# Patient Record
Sex: Male | Born: 1950
Health system: Southern US, Community
[De-identification: ages and names within clinical notes are randomized; demographics above are authoritative.]

## PROBLEM LIST (undated history)

## (undated) DIAGNOSIS — Z720 Tobacco use: Secondary | ICD-10-CM

## (undated) DIAGNOSIS — R569 Unspecified convulsions: Secondary | ICD-10-CM

## (undated) DIAGNOSIS — M1990 Unspecified osteoarthritis, unspecified site: Secondary | ICD-10-CM

## (undated) DIAGNOSIS — K5792 Diverticulitis of intestine, part unspecified, without perforation or abscess without bleeding: Secondary | ICD-10-CM

## (undated) DIAGNOSIS — I341 Nonrheumatic mitral (valve) prolapse: Secondary | ICD-10-CM

## (undated) DIAGNOSIS — H9319 Tinnitus, unspecified ear: Secondary | ICD-10-CM

## (undated) DIAGNOSIS — IMO0001 Reserved for inherently not codable concepts without codable children: Secondary | ICD-10-CM

## (undated) DIAGNOSIS — F419 Anxiety disorder, unspecified: Secondary | ICD-10-CM

## (undated) DIAGNOSIS — G4730 Sleep apnea, unspecified: Secondary | ICD-10-CM

## (undated) DIAGNOSIS — K602 Anal fissure, unspecified: Secondary | ICD-10-CM

## (undated) DIAGNOSIS — G4733 Obstructive sleep apnea (adult) (pediatric): Secondary | ICD-10-CM

## (undated) DIAGNOSIS — Z87442 Personal history of urinary calculi: Secondary | ICD-10-CM

## (undated) DIAGNOSIS — K219 Gastro-esophageal reflux disease without esophagitis: Secondary | ICD-10-CM

## (undated) DIAGNOSIS — E785 Hyperlipidemia, unspecified: Secondary | ICD-10-CM

## (undated) DIAGNOSIS — Z9989 Dependence on other enabling machines and devices: Secondary | ICD-10-CM

## (undated) DIAGNOSIS — F17200 Nicotine dependence, unspecified, uncomplicated: Secondary | ICD-10-CM

## (undated) DIAGNOSIS — R011 Cardiac murmur, unspecified: Secondary | ICD-10-CM

## (undated) DIAGNOSIS — K449 Diaphragmatic hernia without obstruction or gangrene: Secondary | ICD-10-CM

## (undated) HISTORY — PX: INGUINAL HERNIA REPAIR: SUR1180

## (undated) HISTORY — DX: Hyperlipidemia, unspecified: E78.5

## (undated) HISTORY — DX: Anal fissure, unspecified: K60.2

## (undated) HISTORY — DX: Unspecified convulsions: R56.9

## (undated) HISTORY — DX: Reserved for inherently not codable concepts without codable children: IMO0001

## (undated) HISTORY — DX: Tobacco use: Z72.0

## (undated) HISTORY — DX: Anxiety disorder, unspecified: F41.9

## (undated) HISTORY — PX: HERNIA REPAIR: SHX51

## (undated) HISTORY — DX: Sleep apnea, unspecified: G47.30

## (undated) HISTORY — DX: Cardiac murmur, unspecified: R01.1

## (undated) HISTORY — DX: Diverticulitis of intestine, part unspecified, without perforation or abscess without bleeding: K57.92

## (undated) HISTORY — DX: Dependence on other enabling machines and devices: Z99.89

## (undated) HISTORY — DX: Nonrheumatic mitral (valve) prolapse: I34.1

## (undated) HISTORY — DX: Nicotine dependence, unspecified, uncomplicated: F17.200

## (undated) HISTORY — DX: Gastro-esophageal reflux disease without esophagitis: K21.9

## (undated) HISTORY — DX: Tinnitus, unspecified ear: H93.19

## (undated) HISTORY — DX: Obstructive sleep apnea (adult) (pediatric): G47.33

## (undated) HISTORY — PX: COLONOSCOPY: SHX174

## (undated) HISTORY — DX: Gastro-esophageal reflux disease without esophagitis: K44.9

---

## 1997-10-13 ENCOUNTER — Ambulatory Visit (HOSPITAL_BASED_OUTPATIENT_CLINIC_OR_DEPARTMENT_OTHER): Admission: RE | Admit: 1997-10-13 | Discharge: 1997-10-13 | Payer: Self-pay | Admitting: Surgery

## 2002-04-07 ENCOUNTER — Encounter: Payer: Self-pay | Admitting: Surgery

## 2002-04-17 ENCOUNTER — Ambulatory Visit (HOSPITAL_COMMUNITY): Admission: RE | Admit: 2002-04-17 | Discharge: 2002-04-17 | Payer: Self-pay | Admitting: Surgery

## 2002-06-09 ENCOUNTER — Encounter (INDEPENDENT_AMBULATORY_CARE_PROVIDER_SITE_OTHER): Payer: Self-pay | Admitting: Specialist

## 2002-06-09 ENCOUNTER — Ambulatory Visit (HOSPITAL_COMMUNITY): Admission: RE | Admit: 2002-06-09 | Discharge: 2002-06-09 | Payer: Self-pay | Admitting: Gastroenterology

## 2004-02-13 HISTORY — PX: APPENDECTOMY: SHX54

## 2004-12-23 ENCOUNTER — Encounter (INDEPENDENT_AMBULATORY_CARE_PROVIDER_SITE_OTHER): Payer: Self-pay | Admitting: *Deleted

## 2004-12-23 ENCOUNTER — Observation Stay (HOSPITAL_COMMUNITY): Admission: EM | Admit: 2004-12-23 | Discharge: 2004-12-24 | Payer: Self-pay | Admitting: Emergency Medicine

## 2005-02-12 HISTORY — PX: ROTATOR CUFF REPAIR: SHX139

## 2005-07-14 ENCOUNTER — Encounter: Admission: RE | Admit: 2005-07-14 | Discharge: 2005-07-14 | Payer: Self-pay | Admitting: Orthopedic Surgery

## 2005-08-08 ENCOUNTER — Ambulatory Visit (HOSPITAL_BASED_OUTPATIENT_CLINIC_OR_DEPARTMENT_OTHER): Admission: RE | Admit: 2005-08-08 | Discharge: 2005-08-08 | Payer: Self-pay | Admitting: Orthopedic Surgery

## 2006-09-30 IMAGING — CT CT PELVIS W/ CM
3 of 4 series · 14 of 32 positions shown, 19 images · IV contrast (APPLIED)
Comparison: None.

CLINICAL DATA: Right lower quadrant pain.
ABDOMEN CT WITH CONTRAST:
TECHNIQUE: Multidetector CT imaging of the abdomen was performed following the standard protocol during bolus administration of intravenous contrast.
Contrast:  cc Omnipaque 300.
TECHNIQUE: Multidetector CT imaging of the pelvis was performed following the standard protocol during bolus administration of intravenous contrast.

[Series 4: delays 5.0 b31f st · axial · 0.74mm/px · z∈[-324,-234]mm · 4 of 30 slices shown, 9 images]
[im 6/30  soft-tissue]
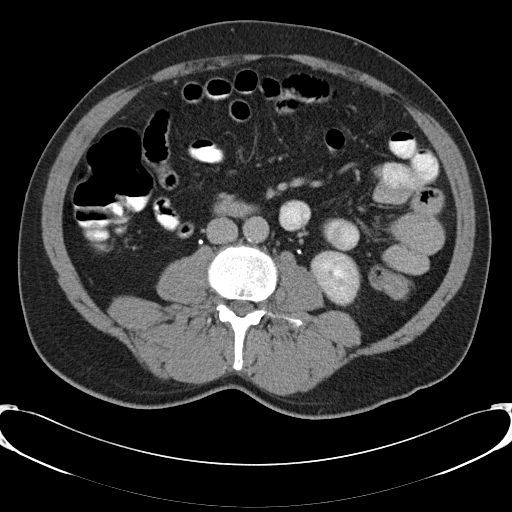
[im 6/30  lung]
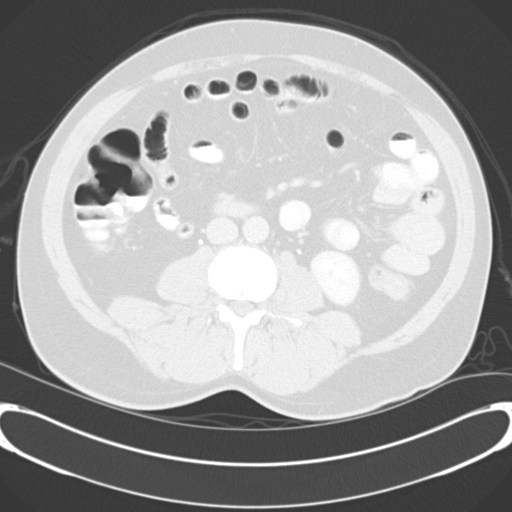
[im 6/30  bone]
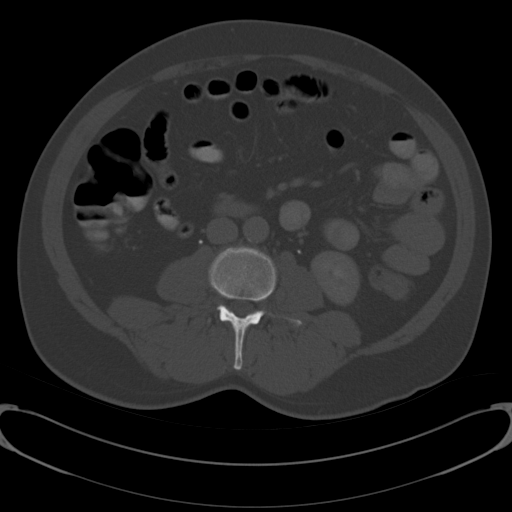
[im 12/30  soft-tissue]
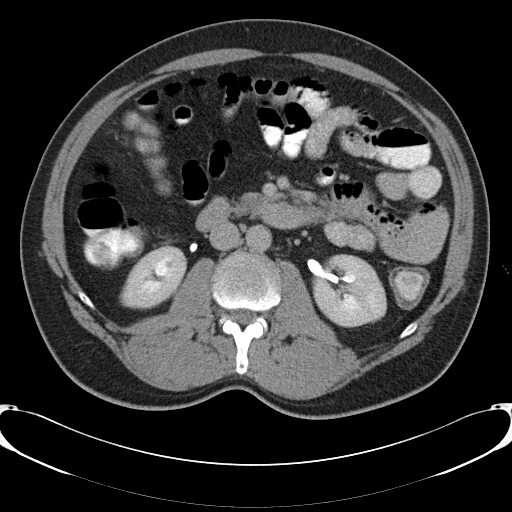
[im 12/30  lung]
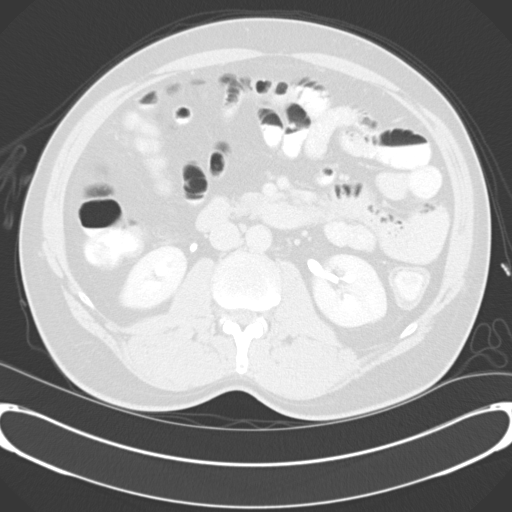
[im 18/30  soft-tissue]
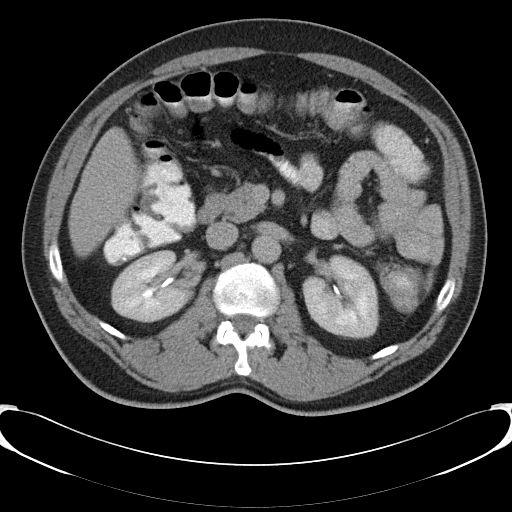
[im 18/30  lung]
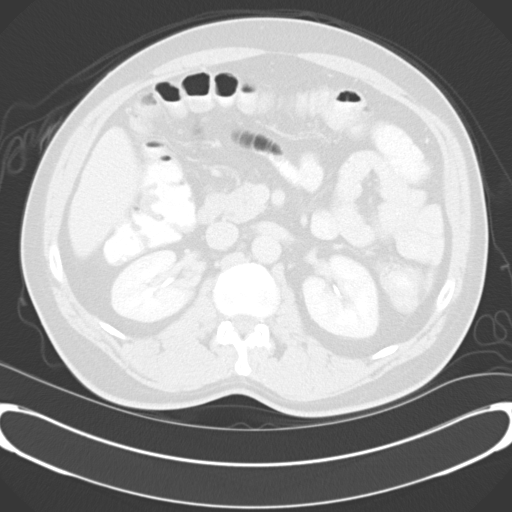
[im 24/30  soft-tissue]
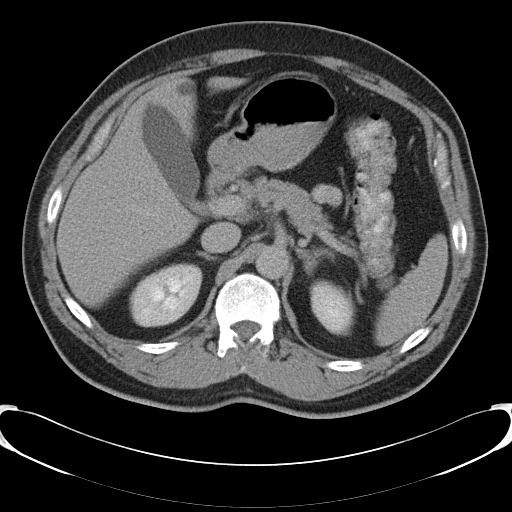
[im 24/30  lung]
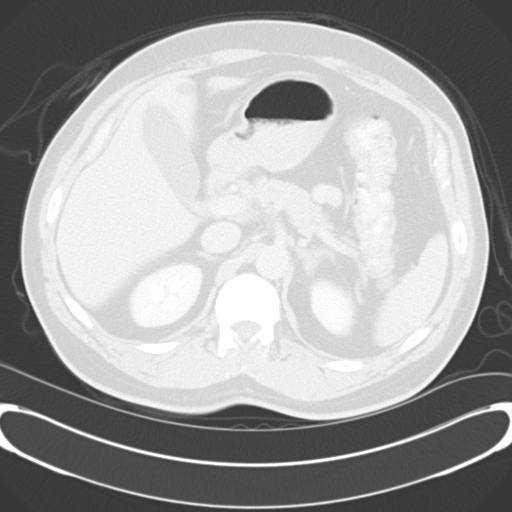

[Series 5: abd/pelv with 5.0 b30f lung · axial · 0.74mm/px · z∈[-508,-154]mm · 8 of 93 slices shown]
[im 11/93  bone]
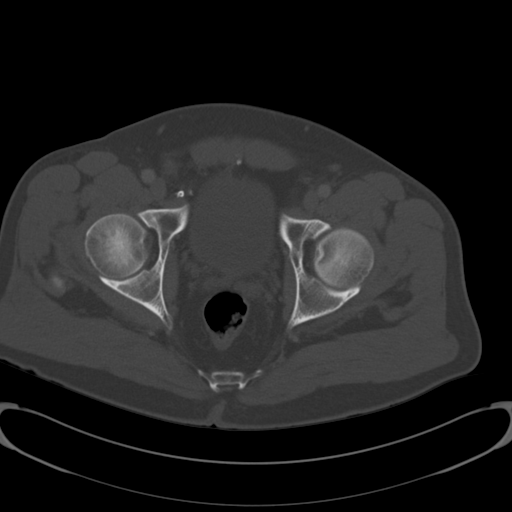
[im 21/93  bone]
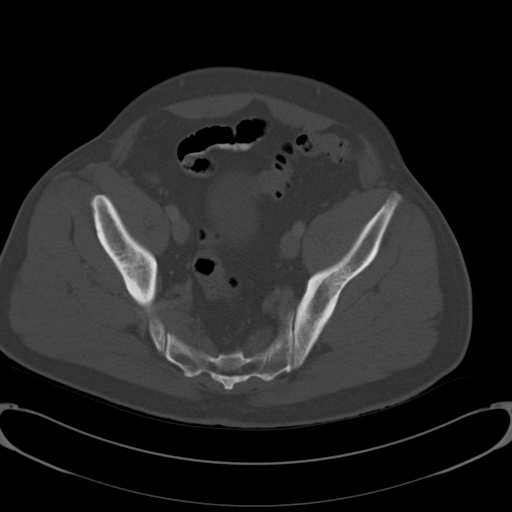
[im 31/93  bone]
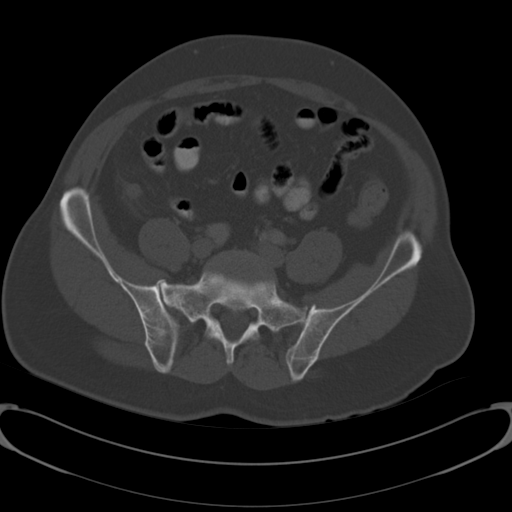
[im 41/93  bone]
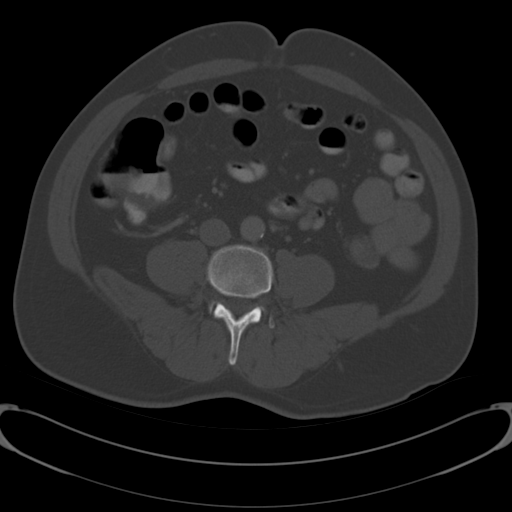
[im 52/93  bone]
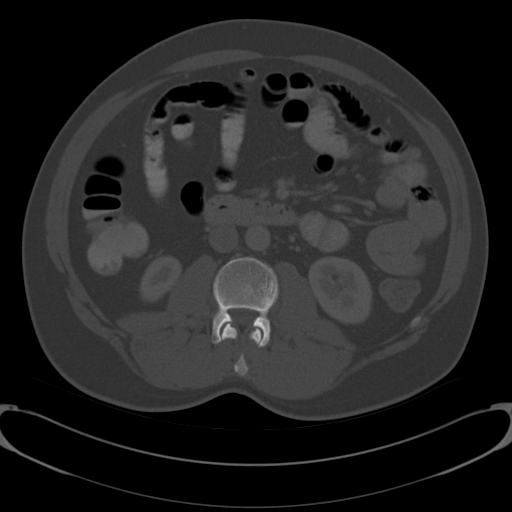
[im 62/93  bone]
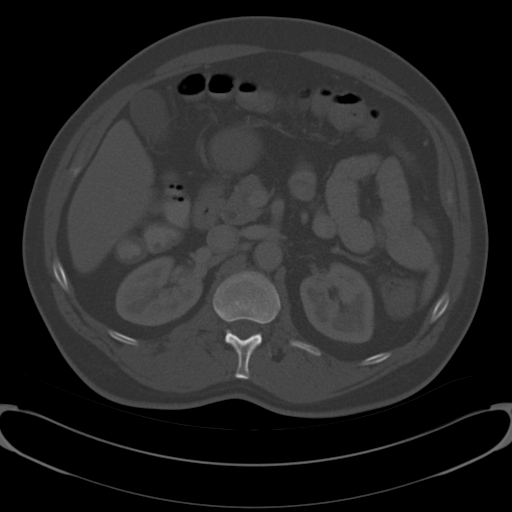
[im 72/93  bone]
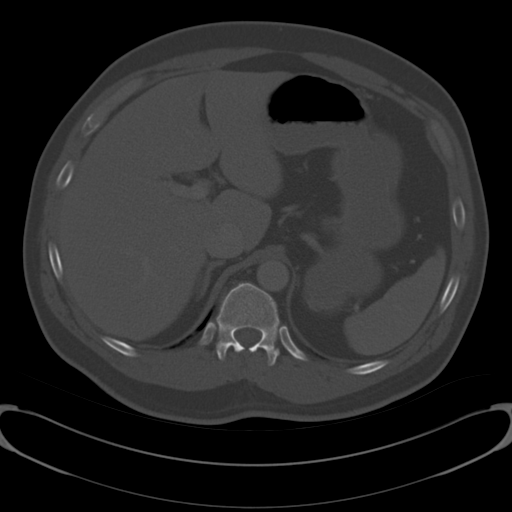
[im 82/93  bone]
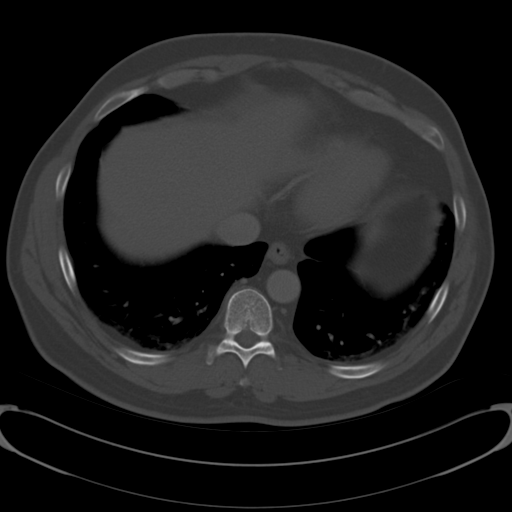

[Series 6: abd/pelv with 5.0 b60f lung · axial · 0.74mm/px · z∈[-194,-164]mm · 2 of 26 slices shown]
[im 7/26  bone]
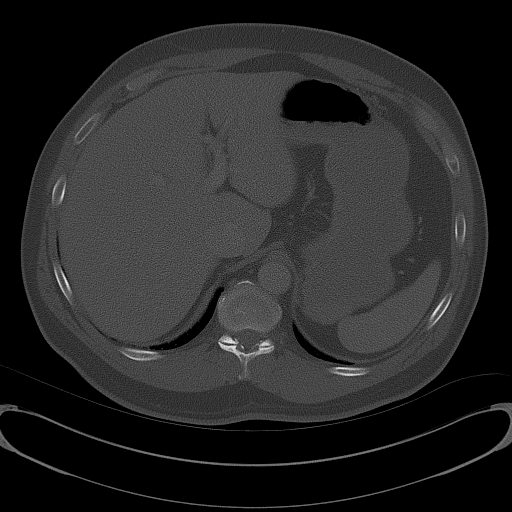
[im 13/26  bone]
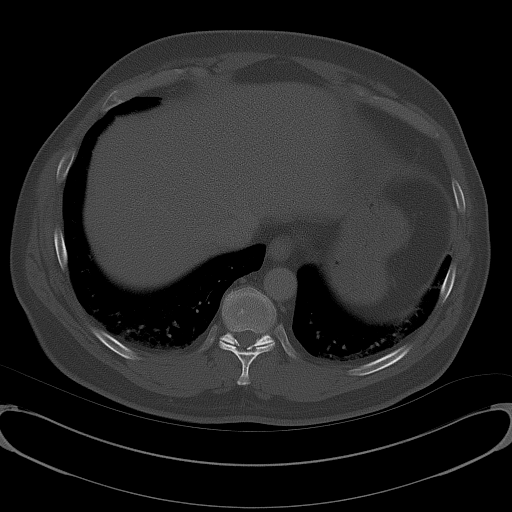

[14 of 32 positions shown; findings below may reference images not displayed]

FINDINGS: Mild scarring at the lung bases.
The liver is essentially normal without  intrahepatic biliary ductal dilatation.  There is a small sub centimeter cyst in the medial aspect of the right lobe of the liver inferiorly.  There is an ill-defined hypodensity near the dome on the right, which I suspect relates to the attachment of the liver to the falciform ligament anteriorly.  No gallbladder pathology noted.  Pancreas, spleen and adrenal glands unremarkable.  Kidneys appear normal with no masses and good excretion.  Small and large bowel unremarkable except for constipation.  No adenopathy or vascular abnormality.
IMPRESSION: No acute intraabdominal findings.
PELVIS CT WITH CONTRAST:
FINDINGS: The appendix is enlarged with surrounding inflammatory change.  Findings are consistent with acute appendicitis.  There is mild thickening of the adjacent medial and inferior cecal wall.  There is no free fluid or pneumoperitoneum.  No adenopathy is noted.  Bones unremarkable.
IMPRESSION: Findings consistent with acute appendicitis.

## 2008-11-03 ENCOUNTER — Encounter: Admission: RE | Admit: 2008-11-03 | Discharge: 2008-11-03 | Payer: Self-pay | Admitting: Otolaryngology

## 2010-06-02 ENCOUNTER — Encounter: Payer: Self-pay | Admitting: Cardiovascular Disease

## 2010-06-02 DIAGNOSIS — G4730 Sleep apnea, unspecified: Secondary | ICD-10-CM

## 2010-06-02 DIAGNOSIS — E785 Hyperlipidemia, unspecified: Secondary | ICD-10-CM | POA: Insufficient documentation

## 2010-06-02 DIAGNOSIS — I341 Nonrheumatic mitral (valve) prolapse: Secondary | ICD-10-CM

## 2010-06-06 ENCOUNTER — Ambulatory Visit (INDEPENDENT_AMBULATORY_CARE_PROVIDER_SITE_OTHER): Payer: BC Managed Care – PPO | Admitting: Cardiovascular Disease

## 2010-06-06 ENCOUNTER — Encounter: Payer: Self-pay | Admitting: Cardiovascular Disease

## 2010-06-06 VITALS — BP 152/92 | HR 58 | Ht 71.0 in | Wt 213.8 lb

## 2010-06-06 DIAGNOSIS — I059 Rheumatic mitral valve disease, unspecified: Secondary | ICD-10-CM

## 2010-06-06 DIAGNOSIS — I10 Essential (primary) hypertension: Secondary | ICD-10-CM

## 2010-06-06 DIAGNOSIS — I341 Nonrheumatic mitral (valve) prolapse: Secondary | ICD-10-CM

## 2010-06-06 DIAGNOSIS — E785 Hyperlipidemia, unspecified: Secondary | ICD-10-CM

## 2010-06-06 NOTE — Progress Notes (Signed)
Michael [name] Date of Birth  July 24, 1950 [name] / [facility] [address]     [address] [place], [place]  [address] [phone]  Fax  [phone]  History of Present Illness:  Michael [name] has done quite well since I saw him a year ago. He has not had any episodes of chest pain or shortness breath. He's been able to do all his normal activities without any significant problems.  His blood pressure has been little bit elevated. He is scheduled to wear a 24-hour blood pressure monitor ordered by Dr. Waynard Edwards.  Current Outpatient Prescriptions on File Prior to Visit  Medication Sig Dispense Refill  . ALPRAZolam (XANAX) 0.5 MG tablet Take 0.5 mg by mouth as needed.        Marland Kitchen aspirin 81 MG tablet Take 81 mg by mouth daily.        . cholecalciferol (VITAMIN D) 1000 UNITS tablet Take 1,000 Units by mouth daily.        . fish oil-omega-3 fatty acids 1000 MG capsule Take 2 g by mouth daily.          No Known Allergies  Past Medical History  Diagnosis Date  . MVP (mitral valve prolapse)   . HLD (hyperlipidemia)   . Sleep apnea     wears CPAP    No past surgical history on file.  History  Smoking status  . Current Everyday Smoker -- 1.0 packs/day for 30 years  Smokeless tobacco  . Not on file    History  Alcohol Use: Not on file    Family History  Problem Relation Age of Onset  . Cancer    . Hypertension      Reviw of Systems:  Reviewed in the HPI.  All other systems are negative.  Physical Exam: BP 152/92  Pulse 58  Ht 5\' 11"  (1.803 m)  Wt 213 lb 12.8 oz (96.979 kg)  BMI 29.82 kg/m2 The patient is alert and oriented x 3.  The mood and affect are normal.  The skin is warm and dry.  Color is normal.  The HEENT exam reveals that the sclera are nonicteric.  The mucous membranes are moist.  The carotids are 2+ without bruits.  There is no thyromegaly.  There is no JVD.  The lungs are clear.  The chest wall is non tender.  The heart exam reveals a  regular rate with a normal S1 and S2. He has a soft systolic murmur.  The PMI is not displaced.   Abdominal exam reveals good bowel sounds.  There is no guarding or rebound.  There is no hepatosplenomegaly or tenderness.  There are no masses.  Exam of the legs reveal no clubbing, cyanosis, or edema.  The legs are without rashes.  The distal pulses are intact.  Cranial nerves II - XII are intact.  Motor and sensory functions are intact.  The gait is normal.  ECG: Sinus bradycardia at 50 beats a minute. He has a left anterior fascicular block. There are no ST or T wave changes. Assessment / Plan:

## 2010-06-06 NOTE — Assessment & Plan Note (Signed)
Michael [name] is doing very well from a cardiac standpoint. He is not having any episodes of chest pain or shortness breath. He's been active. We'll continue with his same medications and I'll see him again in one year.

## 2010-06-06 NOTE — Assessment & Plan Note (Signed)
He is scheduled to wear a 24 blood pressure monitor by Dr. Waynard Edwards later this week. Have him followup with Dr. Waynard Edwards for further management of this and I will be happy to assist if needed

## 2010-06-30 NOTE — Op Note (Signed)
NAME:  Michael [name], Michael [name]                         ACCOUNT NO.:  [id number]   MEDICAL RECORD NO.:  [id number]                   PATIENT TYPE:  AMB   LOCATION:  DAY                                  FACILITY:  [facility]   PHYSICIAN:  Sandria Bales. Ezzard Standing, M.D.               DATE OF BIRTH:  Jun 29, 1950   DATE OF PROCEDURE:  04/17/2002  DATE OF DISCHARGE:                                 OPERATIVE REPORT   OFFICE MEDICAL RECORD NUMBER:  [id number].   PREOPERATIVE DIAGNOSIS:  Right inguinal hernia.   POSTOPERATIVE DIAGNOSIS:  Moderate-sized, indirect right inguinal hernia.   PROCEDURE:  Laparoscopic right inguinal herniorrhaphy with precut Atrium  mesh placed.   SURGEON:  Sandria Bales. Ezzard Standing, MD   FIRST ASSISTANT:  None.   ANESTHESIA:  General endotracheal.   ESTIMATED BLOOD LOSS:  Minimal.   INDICATIONS FOR PROCEDURE:  Michael [name] is a 60 year old white male, who  comes with a small but medium-sized right inguinal hernia which is  reducible.  I discussed with the patient the options of open versus  laparoscopic repair, discussed the potential complications including but not  limited to bleeding, infection, nerve injury, recurrent hernia.  He wants to  undergo a laparoscopic hernia repair and will schedule this at a time  convenient with him.   OPERATIVE NOTE:  The patient now comes to the operating room for excision of  laparoscopic hernia.  He is marked in the right groin.  He is given 1 g of  Ancef at the initiation of the procedure.  His lower abdomen was prepped  with Betadine solution and sterilely draped after it had been shaved.  An  infraumbilical incision was made with sharp dissection carried down to the  rectus abdominis fascia.  I then went to the right side of the rectus  abdominus, retracted the rectus abdominis muscle anteriorly and got into the  preperitoneal space with the PBB balloon by [employer].  I then  insufflated the balloon under direct visualization, half in the  rectus  muscles anteriorly with fat in the peritoneum posteriorly.   After expanding the preperitoneal space, I then removed the preperitoneal  balloon and inserted Hasson trocar and two 5 mm trocars, on in the left  lower quadrant, one in the right lower quadrant.  I identified the pubic  tubercle, Cooper's ligament, the inferior epigastric vessels.  I saw no  evidence of a direct inguinal hernia; however, he had a moderate-size  indirect inguinal hernia which I reduced out of the inguinal canal.  This  also had some fat attached to it and skeletonized this down to blood vessels  and vas deferens.  After I had gotten this adequately out of the inguinal  canal, I then used a precut Atrium mesh and laid this into the abdominal  cavity.  I stapled the mesh medial to the pubic bone, inferiorly to the  Cooper's ligament, superiorly  to the transversalis fascia.  I wrapped it  around the cord structures and tacked the tail back to Cooper's ligament.  I  forwarded the staples lateral to the inguinal ligament and inferior to the  ileopubic track.   The patient tolerated the procedure well.  There was no bleeding at the end  of the case.  The trocars were visualized.  The umbilical port was removed,  a single stitch placed in the umbilical port,  Approximately 10 mL of 0.25%  Marcaine was used as a local anesthetic, and the skin was then closed with a  5-0 Vicryl suture, painted with tincture of Benzoin and Steri-Strips.   The patient tolerated the procedure well and will be discharged home today,  return to see me in 1-2 weeks for follow-up.                                               Sandria Bales. Ezzard Standing, M.D.    DHN/MEDQ  D:  04/17/2002  T:  04/17/2002  Job:  [id number]   cc:   Loraine Leriche A. Waynard Edwards, M.D.  [address]  [place]  [place] [address]  Fax: [phone]

## 2010-06-30 NOTE — Op Note (Signed)
NAME:  Michael [name], Michael [name] NO.:  [id number]   MEDICAL RECORD NO.:  [id number]          PATIENT TYPE:  INP   LOCATION:  1823                         FACILITY:  [facility]   PHYSICIAN:  Thornton Park. Daphine Deutscher, MD  DATE OF BIRTH:  August 22, 1950   DATE OF PROCEDURE:  12/23/2004  DATE OF DISCHARGE:                                 OPERATIVE REPORT   PREOPERATIVE DIAGNOSIS:  Acute appendicitis.   POSTOPERATIVE DIAGNOSIS:  Acute appendicitis.   OPERATION PERFORMED:  Laparoscopic appendectomy.   SURGEON:  Thornton Park. Daphine Deutscher, M.D.   ANESTHESIA:  General endotracheal.   DESCRIPTION OF OPERATION:  [name] was brought to room 16 early on the  morning of  December 23, 2004 and given general anesthesia.  The abdomen was  prepped with Betadine and draped sterilely.  I used an OptiView 11 in the  left upper quadrant, a 12 mm in the left lower quadrant an 5 mm in the right  upper quadrant.  I immobilized the appendix, which was inflamed on CT.  I  properly proceeded to isolate the base, transect that with a stapler and  come to the mesentery with a Harmonic scalpel.  It was placed in a bag and  brought out through the left lower quadrant.   I then inspected and took down some adhesions.  The patient had a previous  laparoscopic right inguinal hernia repair by Dr. Ezzard Standing and there was an  interesting veil of omental adhesions probably to the line of dissection or  where the mesh was.  I took that down partially to enable me to get to the  appendix.  I surveyed and took some pictures.  In the meanwhile I went back  and looked at the appendiceal bed and there was no evidence of bleeding.   I then withdrew the trocars, injected the wounds with Marcaine, and closed  then with 4-0 Vicryl, Benzoin and Steri-strips.  I also placed a fascial  stitch in the left lower quadrant incision using 0-Vicryl.   The patient tolerated the procedure well and was taken the recovery room  satisfactory  condition.      Thornton Park Daphine Deutscher, MD  Electronically Signed     MBM/MEDQ  D:  12/23/2004  T:  12/24/2004  Job:  [id number]   cc:   Loraine Leriche A. Perini, M.D.  Fax: [phone]

## 2010-06-30 NOTE — Op Note (Signed)
NAME:  Michael [name], Michael [name] NO.:  [id number]   MEDICAL RECORD NO.:  [id number]          PATIENT TYPE:  AMB   LOCATION:  DSC                          FACILITY:  [facility]   PHYSICIAN:  Mila Homer. Sherlean Foot, M.D. DATE OF BIRTH:  03-14-1950   DATE OF PROCEDURE:  DATE OF DISCHARGE:                                 OPERATIVE REPORT   PREOPERATIVE DIAGNOSES:  Left shoulder impingement syndrome,  acromioclavicular joint arthritis, labral tearing.   POSTOPERATIVE DIAGNOSES:  Left shoulder impingement syndrome,  acromioclavicular joint arthritis, labral tearing.   PROCEDURE:  Left shoulder arthroscopy with subacromial decompression, distal  clavicle resection, and glenohumeral debridement.   SURGEON:  Mila Homer. Sherlean Foot, M.D.   ANESTHESIA:  General.   INDICATIONS FOR PROCEDURE:  The patient is a 60 year old white male with  mechanical symptoms and MRI evidence of tendinosus, AC joint arthropathy,  and possible labral versus SLAP tear.  Informed consent was obtained.   DESCRIPTION OF PROCEDURE:  The patient was laid supine and induced with  general anesthesia, then placed in beach chair position.  The left shoulder  was prepped and draped in the usual sterile fashion.  Anterior, posterior,  direct lateral portals were created with a #11 blade, blunt trocar, and  cannula.  Diagnostic arthroscopy revealed virtually no glenohumeral  osteoarthritis, however, a degenerative SLAP-type tear.  This was debrided  with the 3.2 Cuda shaver.  There was some undersurface tearing at the  rotator cuff interval.  However, the rest of the rotator cuff looked good.  I did debride the undersurface tear of the rotator cuff.  I then went from  the posterior portal into the subacromial space.  From the direct lateral  portal, I performed a subtotal bursectomy with the 3.2 Cuda shaver.  I then  released the CA ligament with the ArthroCare debridement wand and cleaned  off the undersurface of the acromion  with the ArthroCare debridement wand.  I then used a 4-mm surgical bur to perform an anterolateral acromioplasty  and distal clavicle resection.  I then evaluated the rotator cuff, debrided  it, did not find any full-thickness tears.  I then evacuated the subacromial  space of fluid and instruments, closed with 4-0 nylon sutures, dressed with  Xeroform dressing, sponge, sterile Webril, and Ace wrap.  I did infiltrate a  Marcaine/morphine mixture in each portal.           ______________________________  Mila Homer. Sherlean Foot, M.D.     SDL/MEDQ  D:  08/08/2005  T:  08/08/2005  Job:  [id number]

## 2010-06-30 NOTE — Op Note (Signed)
NAME:  Michael [name], DURAND WITTMEYER                         ACCOUNT NO.:  [id number]   MEDICAL RECORD NO.:  [id number]                   PATIENT TYPE:  AMB   LOCATION:  ENDO                                 FACILITY:  [facility]   PHYSICIAN:  Petra Kuba, M.D.                 DATE OF BIRTH:  [birth year]   DATE OF PROCEDURE:  06/09/2002  DATE OF DISCHARGE:                                 OPERATIVE REPORT   PROCEDURE:  Colonoscopy with biopsy.   INDICATIONS FOR PROCEDURE:  Bright red blood per rectum. Patient due for  colonic screening.   Consent was signed after risks, benefits, methods, and options were  thoroughly discussed in the office.   MEDICINES USED:  Demerol 50, Versed 6.   DESCRIPTION OF PROCEDURE:  Rectal inspection was pertinent for external  hemorrhoids, small. Digital exam was negative. The video colonoscope was  inserted, easily advanced around the colon to hepatic flexure. At that  point, there was looping and with rolling him on his back and abdominal  pressure, we advanced to the level of the ileocecal valve. To get a good  look at the cecal pole required rolling him on his right side. The cecum was  identified by the appendiceal orifice and the ileocecal valve. On insertion,  a small plaque red spot, questionably a polyp, in the descending was seen  which was cold biopsied but no other abnormalities or signs of bleeding. The  scope was slowly withdrawn. The prep was adequate. There was a moderate  amount of bubbles and liquid stool that required washing and suctioning, but  on slow withdrawal through the colon, the cecum, ascending, transverse and  descending were normal except for the spot that we biopsied on insertion.  There was no obvious residual polyp or abnormality when we found the biopsy  site. The scope was continuing to be withdrawn. In the mid sigmoid, a rare  diverticula was seen. At the rectosigmoid junction, another flat red spot  was seen and cold biopsied as  well with the other biopsy. The scope was  retroflexed back in the rectum pertinent for some internal hemorrhoids with  some tears. Anal rectal pull through confirmed the above. The scope was  reinserted a short ways, air was suctioned, scope removed. The patient  tolerated the procedure well. There was no obvious or immediate  complication.   ENDOSCOPIC DIAGNOSIS:  1. Internal greater than external hemorrhoids with tears.  2. Rare left sided diverticula.  3. Two tiny flat red spots without polyps status post cold biopsy distal     sigmoid and descending.  4. Otherwise within normal limits to the cecum.    PLAN:  Await pathology, barring a surprise recheck colon screening in five  years. In the meantime, treat hemorrhoids with suppositories ______.  Followup p.r.n. or in two months. If bleeding continues despite above  therapy, may consider a one time surgical  option.                                               Petra Kuba, M.D.    MEM/MEDQ  D:  06/09/2002  T:  06/09/2002  Job:  [id number]   cc:   Loraine Leriche A. Waynard Edwards, M.D.  [address]  [place]  [place] [address]  Fax: [phone]

## 2011-08-21 ENCOUNTER — Encounter: Payer: Self-pay | Admitting: Cardiovascular Disease

## 2011-08-21 ENCOUNTER — Ambulatory Visit (INDEPENDENT_AMBULATORY_CARE_PROVIDER_SITE_OTHER): Payer: BC Managed Care – PPO | Admitting: Cardiovascular Disease

## 2011-08-21 VITALS — BP 115/84 | HR 63 | Ht 71.0 in | Wt 215.1 lb

## 2011-08-21 DIAGNOSIS — I10 Essential (primary) hypertension: Secondary | ICD-10-CM

## 2011-08-21 DIAGNOSIS — I341 Nonrheumatic mitral (valve) prolapse: Secondary | ICD-10-CM

## 2011-08-21 DIAGNOSIS — I059 Rheumatic mitral valve disease, unspecified: Secondary | ICD-10-CM

## 2011-08-21 NOTE — Assessment & Plan Note (Signed)
Michael [name] is doing well.  He is asymptomatic.  We'll see him again in one year. We'll and to stay getting an echocardiogram week before his next visit.

## 2011-08-21 NOTE — Progress Notes (Signed)
    Michael [name] Date of Birth  July 24, 1950       [facility]    [place] [address]  [address] [place], [place]  [address]   [place], [place]  [address] [phone]     [phone]   Fax  [phone]    Fax [phone]  Problem List: 1. Mitral valve prolapse with mitral regurgitation 2. Hyperlipidemia 3. Hypertension  History of Present Illness:  Michael [name] is a 61 yo with hx of MVP and mild MR.  He has done well for the past year. He has not had any episodes of chest pain or shortness of breath.  Current Outpatient Prescriptions on File Prior to Visit  Medication Sig Dispense Refill  . ALPRAZolam (XANAX) 0.5 MG tablet Take 0.5 mg by mouth as needed.        Marland Kitchen aspirin 81 MG tablet Take 81 mg by mouth daily.        . B Complex CAPS Take by mouth daily.        . cholecalciferol (VITAMIN D) 1000 UNITS tablet Take 1,000 Units by mouth daily.        . fish oil-omega-3 fatty acids 1000 MG capsule Take 2,400 mg by mouth daily.       Marland Kitchen loratadine (CLARITIN) 10 MG tablet Take 10 mg by mouth daily.        Marland Kitchen omeprazole (PRILOSEC OTC) 20 MG tablet Take 20 mg by mouth daily.        . simvastatin (ZOCOR) 20 MG tablet Take 20 mg by mouth at bedtime.          No Known Allergies  Past Medical History  Diagnosis Date  . MVP (mitral valve prolapse)   . HLD (hyperlipidemia)   . Sleep apnea     wears CPAP    No past surgical history on file.  History  Smoking status  . Current Everyday Smoker -- 1.0 packs/day for 30 years  Smokeless tobacco  . Not on file    History  Alcohol Use: Not on file    Family History  Problem Relation Age of Onset  . Cancer    . Hypertension      Reviw of Systems:  Reviewed in the HPI.  All other systems are negative.  Physical Exam: Blood pressure 144/89, pulse 63, height 5\' 11"  (1.803 m), weight 215 lb 1.9 oz (97.578 kg). General: Well developed, well nourished, in no acute distress.  Head:  Normocephalic, atraumatic, sclera non-icteric, mucus membranes are moist,   Neck: Supple. Carotids are 2 + without bruits. No JVD  Lungs: Clear bilaterally to auscultation.  Heart: regular rate.  normal  S1 S2. There is a soft 2/6 systolic murmur at the axilla.  Abdomen: Soft, non-tender, non-distended with normal bowel sounds. No hepatomegaly. No rebound/guarding. No masses.  Msk:  Strength and tone are normal  Extremities: No clubbing or cyanosis. No edema.  Distal pedal pulses are 2+ and equal bilaterally.  Neuro: Alert and oriented X 3. Moves all extremities spontaneously.  Psych:  Responds to questions appropriately with a normal affect.  ECG: 08/21/2011-normal sinus rhythm at 63 beats a minute. He has no ST or T wave changes.  Assessment / Plan:

## 2011-08-21 NOTE — Patient Instructions (Signed)
Your physician wants you to follow-up in: 1 YEAR  You will receive a reminder letter in the mail two months in advance. If you don't receive a letter, please call our office to schedule the follow-up appointment.  WEEK PRIOR TO OV, Your physician has requested that you have an echocardiogram. Echocardiography is a painless test that uses sound waves to create images of your heart. It provides your doctor with information about the size and shape of your heart and how well your heart's chambers and valves are working. This procedure takes approximately one hour. There are no restrictions for this procedure.

## 2012-02-13 DIAGNOSIS — IMO0001 Reserved for inherently not codable concepts without codable children: Secondary | ICD-10-CM

## 2012-02-13 HISTORY — DX: Reserved for inherently not codable concepts without codable children: IMO0001

## 2012-02-15 ENCOUNTER — Telehealth: Payer: Self-pay | Admitting: Cardiovascular Disease

## 2012-02-15 DIAGNOSIS — R0602 Shortness of breath: Secondary | ICD-10-CM

## 2012-02-15 DIAGNOSIS — R42 Dizziness and giddiness: Secondary | ICD-10-CM

## 2012-02-15 DIAGNOSIS — I10 Essential (primary) hypertension: Secondary | ICD-10-CM

## 2012-02-15 NOTE — Telephone Encounter (Signed)
I can either see him first and try to get a myoview afterwards or we can try to order a myoview initially  based on his symptoms.  I'm not sure if BC/BS will approve this based on symptoms.

## 2012-02-15 NOTE — Telephone Encounter (Signed)
Pt sees dr Waynard Edwards and he wanted a stress test done  for high BP, sob, lightheadedness, but they were to call 3 weeks ago and pt called to see if done and not yet set up, he called back there yesterday and they still haven't called, can he get an order or would dr Elease Hashimoto rather see him? [phone] or [phone]

## 2012-02-15 NOTE — Telephone Encounter (Signed)
Pt c/o lightheadedness and occ SOB, denies CP, he has been working with his pcp for htn to adjust his bp meds. PCP and Pt discussed and  would like a stress test, can we order one? Pt aware he will not hear anything till Monday and agreed to plan.

## 2012-02-18 NOTE — Telephone Encounter (Signed)
Order placed , pt was given information, msg to pcc.

## 2012-02-18 NOTE — Telephone Encounter (Signed)
App made 

## 2012-02-18 NOTE — Telephone Encounter (Signed)
Set up OV with me or with Lawson Fiscal

## 2012-02-21 ENCOUNTER — Ambulatory Visit (HOSPITAL_COMMUNITY): Payer: BC Managed Care – PPO | Attending: Cardiology | Admitting: Radiology

## 2012-02-21 VITALS — BP 112/66 | HR 42 | Ht 71.0 in | Wt 218.0 lb

## 2012-02-21 DIAGNOSIS — R0789 Other chest pain: Secondary | ICD-10-CM | POA: Insufficient documentation

## 2012-02-21 DIAGNOSIS — F17200 Nicotine dependence, unspecified, uncomplicated: Secondary | ICD-10-CM | POA: Insufficient documentation

## 2012-02-21 DIAGNOSIS — I10 Essential (primary) hypertension: Secondary | ICD-10-CM | POA: Insufficient documentation

## 2012-02-21 DIAGNOSIS — R079 Chest pain, unspecified: Secondary | ICD-10-CM

## 2012-02-21 DIAGNOSIS — R0602 Shortness of breath: Secondary | ICD-10-CM | POA: Insufficient documentation

## 2012-02-21 DIAGNOSIS — R42 Dizziness and giddiness: Secondary | ICD-10-CM

## 2012-02-21 MED ORDER — TECHNETIUM TC 99M SESTAMIBI GENERIC - CARDIOLITE
33.0000 | Freq: Once | INTRAVENOUS | Status: AC | PRN
  Administered 2012-02-21: 33 via INTRAVENOUS

## 2012-02-21 MED ORDER — TECHNETIUM TC 99M SESTAMIBI GENERIC - CARDIOLITE
10.9000 | Freq: Once | INTRAVENOUS | Status: AC | PRN
  Administered 2012-02-21: 10.9 via INTRAVENOUS

## 2012-02-21 MED ORDER — REGADENOSON 0.4 MG/5ML IV SOLN
0.4000 mg | Freq: Once | INTRAVENOUS | Status: AC
  Administered 2012-02-21: 0.4 mg via INTRAVENOUS

## 2012-02-21 NOTE — Progress Notes (Signed)
[facility] SITE 3 NUCLEAR MED [address] [place], [place] [address] [phone]    Cardiology Nuclear Med Study  Michael [name] is a 62 y.o. male     MRN : [id number]     DOB: 05-Apr-1950  Procedure Date: 02/21/2012  Nuclear Med Background Indication for Stress Test:  Evaluation for Ischemia History:  No prior known history of CAD Cardiac Risk Factors: Hypertension, Lipids and Smoker  Symptoms:  Chest Pressure.  (last episode of chest discomfort was yesterday), Diaphoresis, Fatigue, Light-Headedness and SOB   Nuclear Pre-Procedure Caffeine/Decaff Intake:  None > 12 hrs NPO After: 6:30pm   Lungs:  clear O2 Sat: 95% on room air. IV 0.9% NS with Angio Cath:  20g  IV Site: L Antecubital x 1, tolerated well IV Started by:  Irean Hong, RN  Chest Size (in):  44 Cup Size: n/a  Height: 5\' 11"  (1.803 m)  Weight:  218 lb (98.884 kg)  BMI:  Body mass index is 30.40 kg/(m^2). Tech Comments:  No medication today    Nuclear Med Study 1 or 2 day study: 1 day  Stress Test Type:  Treadmill/Lexiscan  Reading MD: Olga Millers, MD  Order Authorizing Provider:  Kristeen Miss, MD and Norma Fredrickson, NP  Resting Radionuclide: Technetium 8m Sestamibi  Resting Radionuclide Dose: 10.9 mCi   Stress Radionuclide:  Technetium 39m Sestamibi  Stress Radionuclide Dose: 33.0 mCi           Stress Protocol Rest HR: 42 Stress HR: 111  Rest BP: 112/66 Stress BP: 148/80  Exercise Time (min): 9:00 METS: 7.6   Predicted Max HR: 158 bpm % Max HR: 70.25 bpm Rate Pressure Product: 28413    Dose of Adenosine (mg):  n/a Dose of Lexiscan: 0.4 mg  Dose of Atropine (mg): n/a Dose of Dobutamine: n/a mcg/kg/min (at max HR)  Stress Test Technologist: Smiley Houseman, CMA-N  Nuclear Technologist:  Domenic Polite, CNMT     Rest Procedure:  Myocardial perfusion imaging was performed at rest 45 minutes following the intravenous administration of Technetium 70m Sestamibi.  Rest ECG: Marked sinus  bradycardia.  Stress Procedure: The patient initially walked the treadmill for seven minutes but was unable to reach his target heart rate. He was then given IV Lexiscan 0.4 mg over 15-seconds with concurrent low level exercise and then Technetium 14m Sestamibi was injected at 30-seconds while the patient continued walking one more minute.  He c/o chest tightness, 8/10, and nausea with Lexiscan; relieved in recovery.  Quantitative spect images were obtained after a 45-minute delay.  Stress ECG: No significant ST segment change suggestive of ischemia.  QPS Raw Data Images:  Acquisition technically good; LVE. Stress Images:  There is decreased uptake in the apex. Rest Images:  There is decreased uptake in the apex. Subtraction (SDS):  No evidence of ischemia. Transient Ischemic Dilatation (Normal <1.22):  1.02 Lung/Heart Ratio (Normal <0.45):  0.34  Quantitative Gated Spect Images QGS EDV:  164 ml QGS ESV:  62 ml  Impression Exercise Capacity:  Lexiscan with low level exercise. BP Response:  Normal blood pressure response. Clinical Symptoms:  There is chest pain. ECG Impression:  No significant ST segment change suggestive of ischemia. Comparison with Prior Nuclear Study: No previous nuclear study performed  Overall Impression:  Normal stress nuclear study with a small, mild, fixed apical defect consistent with apical thinning; no ischemia; note mild LVE.  LV Ejection Fraction: 62%.  LV Wall Motion:  NL LV Function; NL Wall  Motion  Olga Millers

## 2012-02-22 ENCOUNTER — Encounter: Payer: Self-pay | Admitting: Nurse Practitioner

## 2012-02-22 ENCOUNTER — Ambulatory Visit (INDEPENDENT_AMBULATORY_CARE_PROVIDER_SITE_OTHER): Payer: BC Managed Care – PPO | Admitting: Nurse Practitioner

## 2012-02-22 VITALS — BP 130/90 | HR 48 | Ht 71.0 in | Wt 221.0 lb

## 2012-02-22 DIAGNOSIS — I341 Nonrheumatic mitral (valve) prolapse: Secondary | ICD-10-CM

## 2012-02-22 DIAGNOSIS — I059 Rheumatic mitral valve disease, unspecified: Secondary | ICD-10-CM

## 2012-02-22 MED ORDER — LOSARTAN POTASSIUM 50 MG PO TABS: 50.0000 mg | ORAL_TABLET | Freq: Two times a day (BID) | ORAL | Status: DC

## 2012-02-22 NOTE — Progress Notes (Signed)
Michael [name] Date of Birth: [birth year] Medical Record #[id number]  History of Present Illness: Mr. Michael [name] is seen back today for a follow up visit. He is seen for Dr. Elease Hashimoto. He has MVP with moderate MR. Last echo in 2009. Other issues include ongoing tobacco abuse, HTN and HLD.   He called earlier this month with complaints of lightheadedness and dyspnea. Wanted a stress test for his own peace of mind. He underwent a low level exercise Lexiscan with no ischemia noted. EF was normal.   He comes in today. He is here alone. He is doing ok. Not seen here since July. Was doing ok up until just 2 to 3 months ago. Started having what he calls "panic attacks". Has seen Dr. Waynard Edwards. Was treated with anxiety and his elevated BP. He describes a feeling as where he would feel "something coming over him, getting very sweaty, and then a feeling of having to get up and get out". No real chest pain. Feels like his chest "swells up". Little lightheaded each morning. BP 140's at home. Has started using a recumbent bike. His symptoms are improving but not resolved. No syncope. No palpitations. Dr. Waynard Edwards does all of his labs.   Current Outpatient Prescriptions on File Prior to Visit  Medication Sig Dispense Refill  . ALPRAZolam (XANAX) 0.5 MG tablet Take 0.5 mg by mouth as needed.        Marland Kitchen aspirin 81 MG tablet Take 81 mg by mouth daily.        . B Complex CAPS Take by mouth daily.        . cholecalciferol (VITAMIN D) 1000 UNITS tablet Take 1,000 Units by mouth daily.        Marland Kitchen loratadine (CLARITIN) 10 MG tablet Take 10 mg by mouth as needed.       Marland Kitchen losartan (COZAAR) 50 MG tablet Take 50 mg by mouth daily.      . nebivolol (BYSTOLIC) 10 MG tablet Take 10 mg by mouth daily.      . pantoprazole (PROTONIX) 40 MG tablet Take 40 mg by mouth daily.      . sertraline (ZOLOFT) 50 MG tablet Take 50 mg by mouth daily.      . simvastatin (ZOCOR) 20 MG tablet Take 20 mg by mouth at bedtime.          No Known  Allergies  Past Medical History  Diagnosis Date  . MVP (mitral valve prolapse)     with moderate MR per echo in 2009  . HLD (hyperlipidemia)   . Sleep apnea     wears CPAP  . Normal cardiac stress test January 2014  . Tobacco abuse     Past Surgical History  Procedure Date  . Appendectomy   . Hernia repair   . Rotator cuff repair     History  Smoking status  . Current Every Day Smoker -- 1.0 packs/day for 30 years  Smokeless tobacco  . Not on file    History  Alcohol Use No    Family History  Problem Relation Age of Onset  . Cancer    . Hypertension    . Cancer Mother     Review of Systems: The review of systems is per the HPI.  All other systems were reviewed and are negative.  Physical Exam: BP 130/90  Pulse 48  Ht 5\' 11"  (1.803 m)  Wt 221 lb (100.245 kg)  BMI 30.82 kg/m2 Patient is very pleasant and in  no acute distress. Skin is warm and dry. Color is normal.  HEENT is unremarkable. Normocephalic/atraumatic. PERRL. Sclera are nonicteric. Neck is supple. No masses. No JVD. Lungs are clear. Cardiac exam shows a regular rate and rhythm. His rate is slow. He has a systolic murmur noted. Abdomen is soft. Extremities are without edema. Gait and ROM are intact. No gross neurologic deficits noted.   LABORATORY DATA:  Myoview Impression  Exercise Capacity: Lexiscan with low level exercise.  BP Response: Normal blood pressure response.  Clinical Symptoms: There is chest pain.  ECG Impression: No significant ST segment change suggestive of ischemia.  Comparison with Prior Nuclear Study: No previous nuclear study performed   Overall Impression: Normal stress nuclear study with a small, mild, fixed apical defect consistent with apical thinning; no ischemia; note mild LVE.  LV Ejection Fraction: 62%. LV Wall Motion: NL LV Function; NL Wall Motion  Brian Crenshaw  No results found for this basename: WBC,  HGB,  HCT,  PLT,  GLUCOSE,  CHOL,  TRIG,  HDL,  LDLDIRECT,   LDLCALC,  ALT,  AST,  NA,  K,  CL,  CREATININE,  BUN,  CO2,  TSH,  PSA,  INR,  GLUF,  HGBA1C,  MICROALBUR    Assessment / Plan:  1. MVP - with moderate MR - last echo in 2009 - I think we need to go ahead and update.   2. HTN - BP not at goal. We will increase his Losartan to 50 mg BID  3. HLD - checked by Dr. Waynard Edwards  4. Tobacco abuse - he is going to try the electronic cigarette.  5. Negative Myoview  We will go ahead and get his echo. I will have Dr. Elease Hashimoto see him back in about 3 weeks. Patient is agreeable to this plan and will call if any problems develop in the interim.

## 2012-02-22 NOTE — Patient Instructions (Addendum)
Smoking cessation is encouraged  We are going to get an ultrasound of your heart  I want you to increase your Losartan to 50 mg two times a day  Monitor your blood pressure at home  See Dr. Elease Hashimoto in 3 weeks  Call the [facility] office at [phone] if you have any questions, problems or concerns.

## 2012-02-28 ENCOUNTER — Ambulatory Visit (HOSPITAL_COMMUNITY): Payer: BC Managed Care – PPO | Attending: Cardiology | Admitting: Radiology

## 2012-02-28 ENCOUNTER — Other Ambulatory Visit (HOSPITAL_COMMUNITY): Payer: Self-pay | Admitting: Cardiovascular Disease

## 2012-02-28 DIAGNOSIS — I059 Rheumatic mitral valve disease, unspecified: Secondary | ICD-10-CM | POA: Insufficient documentation

## 2012-02-28 DIAGNOSIS — I10 Essential (primary) hypertension: Secondary | ICD-10-CM | POA: Insufficient documentation

## 2012-02-28 DIAGNOSIS — R0609 Other forms of dyspnea: Secondary | ICD-10-CM | POA: Insufficient documentation

## 2012-02-28 DIAGNOSIS — F17200 Nicotine dependence, unspecified, uncomplicated: Secondary | ICD-10-CM | POA: Insufficient documentation

## 2012-02-28 DIAGNOSIS — R0989 Other specified symptoms and signs involving the circulatory and respiratory systems: Secondary | ICD-10-CM | POA: Insufficient documentation

## 2012-02-28 NOTE — Progress Notes (Signed)
Echocardiogram performed.  

## 2012-03-13 ENCOUNTER — Encounter: Payer: Self-pay | Admitting: Cardiovascular Disease

## 2012-03-13 ENCOUNTER — Ambulatory Visit (INDEPENDENT_AMBULATORY_CARE_PROVIDER_SITE_OTHER): Payer: BC Managed Care – PPO | Admitting: Cardiovascular Disease

## 2012-03-13 VITALS — BP 120/80 | HR 45 | Ht 71.0 in | Wt 224.8 lb

## 2012-03-13 DIAGNOSIS — I341 Nonrheumatic mitral (valve) prolapse: Secondary | ICD-10-CM

## 2012-03-13 DIAGNOSIS — I059 Rheumatic mitral valve disease, unspecified: Secondary | ICD-10-CM

## 2012-03-13 DIAGNOSIS — R001 Bradycardia, unspecified: Secondary | ICD-10-CM

## 2012-03-13 DIAGNOSIS — I498 Other specified cardiac arrhythmias: Secondary | ICD-10-CM

## 2012-03-13 MED ORDER — NEBIVOLOL HCL 5 MG PO TABS: 5.0000 mg | ORAL_TABLET | Freq: Every day | ORAL | Status: DC

## 2012-03-13 NOTE — Patient Instructions (Addendum)
Your physician has recommended you make the following change in your medication:   DECREASE BYSTOLIC 5 MG ONCE A DAY  Your physician wants you to follow-up in: 6 MONTHS WITH EKG You will receive a reminder letter in the mail two months in advance. If you don't receive a letter, please call our office to schedule the follow-up appointment.

## 2012-03-13 NOTE — Assessment & Plan Note (Signed)
This is a his echocardiogram reveals left atrial enlargement because of his chronic mitral regurgitation is much prolapse the echo was otherwise unremarkable.  He concerned about his slow heart rate he states that he is occasionally dizzy. We will decrease his Bystolic to 5 mg a day.  He has stopped smoking and is exercising daily. This should help keep his blood pressure down which will allow [place] to decrease   his Bystolic.

## 2012-03-13 NOTE — Progress Notes (Signed)
Caidon Foti Legler Date of Birth  29-May-1950       [facility]    [place] [address]  [address] [place], [place]  [address]   [place], [place]  [address] [phone]     [phone]   Fax  [phone]    Fax [phone]  Problem List: 1. Mitral valve prolapse with mitral regurgitation 2. Hyperlipidemia 3. Hypertension  History of Present Illness:  Alhassan is a 62 yo with hx of MVP and mild MR.  He has done well for the past year. He has not had any episodes of chest pain or shortness of breath.   March 13, 2012. He's done very well since I saw him last year. He's not had any episodes of chest pain. He does have some occasional palpitations which she thinks may be due to anxiety.  His heart rate has been slow which is somewhat concerning to him. He thinks he may feel a little fatigued or dizzy on occasion.  Current Outpatient Prescriptions on File Prior to Visit  Medication Sig Dispense Refill  . ALPRAZolam (XANAX) 0.5 MG tablet Take 0.5 mg by mouth as needed.        Marland Kitchen aspirin 81 MG tablet Take 81 mg by mouth daily.        . B Complex CAPS Take by mouth daily.        . cholecalciferol (VITAMIN D) 1000 UNITS tablet Take 1,000 Units by mouth daily.        Marland Kitchen FIBER COMPLETE PO Take by mouth daily.      Marland Kitchen loratadine (CLARITIN) 10 MG tablet Take 10 mg by mouth as needed.       Marland Kitchen losartan (COZAAR) 50 MG tablet Take 1 tablet (50 mg total) by mouth 2 (two) times daily.  60 tablet  6  . nebivolol (BYSTOLIC) 10 MG tablet Take 10 mg by mouth daily.      . pantoprazole (PROTONIX) 40 MG tablet Take 40 mg by mouth daily.      . sertraline (ZOLOFT) 50 MG tablet Take 50 mg by mouth daily.      . simvastatin (ZOCOR) 20 MG tablet Take 20 mg by mouth at bedtime.          No Known Allergies  Past Medical History  Diagnosis Date  . MVP (mitral valve prolapse)     with moderate MR per echo in 2009  . HLD (hyperlipidemia)   . Sleep apnea     wears CPAP  . Normal cardiac stress test January 2014  . Tobacco abuse     Past Surgical History  Procedure Date  . Appendectomy   . Hernia repair   . Rotator cuff repair     History  Smoking status  . Former Smoker -- 1.0 packs/day for 30 years  . Start date: 04/12/1967  . Quit date: 03/06/2012  Smokeless tobacco  . Current User    History  Alcohol Use No    Family History  Problem Relation Age of Onset  . Cancer    . Hypertension    . Cancer Mother     Reviw of Systems:  Reviewed in the HPI.  All other systems are negative.  Physical Exam: Blood pressure 120/80, pulse 45, height 5\' 11"  (1.803 m), weight 224 lb 12.8 oz (101.969 kg), SpO2 97.00%. General: Well developed, well nourished, in no acute distress.  Head: Normocephalic, atraumatic, sclera non-icteric, mucus membranes are moist,  Neck: Supple. Carotids are 2 + without bruits. No JVD  Lungs: Clear bilaterally to auscultation.  Heart: regular rate.  normal  S1 S2. There is a soft 2/6 systolic murmur at the axilla.  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  Abdomen: Soft, non-tender, non-distended with normal bowel sounds. No hepatomegaly. No rebound/guarding. No masses.  Msk:  Strength and tone are normal  Extremities: No clubbing or cyanosis. No edema.  Distal pedal pulses are 2+ and equal bilaterally.  Neuro: Alert and oriented X 3. Moves all extremities spontaneously.  Psych:  Responds to questions appropriately with a normal affect.  ECG: 08/21/2011-normal sinus rhythm at 63 beats a minute. He has no ST or T wave changes.  Assessment / Plan:

## 2012-11-04 ENCOUNTER — Other Ambulatory Visit: Payer: Self-pay | Admitting: Gastroenterology

## 2012-12-22 ENCOUNTER — Ambulatory Visit (INDEPENDENT_AMBULATORY_CARE_PROVIDER_SITE_OTHER): Payer: BC Managed Care – PPO | Admitting: Cardiovascular Disease

## 2012-12-22 ENCOUNTER — Encounter: Payer: Self-pay | Admitting: Cardiovascular Disease

## 2012-12-22 VITALS — BP 144/86 | HR 43 | Ht 72.0 in | Wt 227.0 lb

## 2012-12-22 DIAGNOSIS — I059 Rheumatic mitral valve disease, unspecified: Secondary | ICD-10-CM

## 2012-12-22 DIAGNOSIS — I10 Essential (primary) hypertension: Secondary | ICD-10-CM

## 2012-12-22 DIAGNOSIS — I341 Nonrheumatic mitral (valve) prolapse: Secondary | ICD-10-CM

## 2012-12-22 NOTE — Assessment & Plan Note (Signed)
His BP is ok.  He is chronically bradycardic but remains asymptomatic.  Will continue current meds.   i will see him in 1 year.

## 2012-12-22 NOTE — Assessment & Plan Note (Signed)
His MVP is stable.  Continue current meds.

## 2012-12-22 NOTE — Progress Notes (Signed)
Derec Mozingo Maclaughlin Date of Birth  1950-08-12       [facility]    [place] [address]  [address] [place], [place]  [address]   [place], [place]  [address] [phone]     [phone]   Fax  [phone]    Fax [phone]  Problem List: 1. Mitral valve prolapse with mitral regurgitation 2. Hyperlipidemia 3. Hypertension  History of Present Illness:  Que is a 62 yo with hx of MVP and mild MR.  He has done well for the past year. He has not had any episodes of chest pain or shortness of breath.   March 13, 2012. He's done very well since I saw him last year. He's not had any episodes of chest pain. He does have some occasional palpitations which she thinks may be due to anxiety.  His heart rate has been slow which is somewhat concerning to him. He thinks he may feel a little fatigued or dizzy on occasion.  Nov. 10, 2014:  Dayquan is being doing well.  He is busy remodeling his house.   He is now retired (from [employer]).   He is getting some exercise.   Current Outpatient Prescriptions on File Prior to Visit  Medication Sig Dispense Refill  . ALPRAZolam (XANAX) 0.5 MG tablet Take 0.5 mg by mouth as needed.        Marland Kitchen aspirin 81 MG tablet Take 81 mg by mouth daily.        . B Complex CAPS Take by mouth daily.        . cholecalciferol (VITAMIN D) 1000 UNITS tablet Take 1,000 Units by mouth daily.        Marland Kitchen FIBER COMPLETE PO Take by mouth daily.      Marland Kitchen loratadine (CLARITIN) 10 MG tablet Take 10 mg by mouth as needed.       Marland Kitchen losartan (COZAAR) 50 MG tablet Take 1 tablet (50 mg total) by mouth 2 (two) times daily.  60 tablet  6  . nebivolol (BYSTOLIC) 5 MG tablet Take 1 tablet (5 mg total) by mouth daily.  30 tablet  5  . pantoprazole (PROTONIX) 40 MG tablet Take 40 mg by mouth daily.      . sertraline (ZOLOFT) 50 MG tablet Take 50 mg by mouth daily.      . simvastatin (ZOCOR) 20 MG tablet Take 20 mg by mouth at bedtime.          No current facility-administered medications on file prior to visit.    No Known Allergies  Past Medical History  Diagnosis Date  . MVP (mitral valve prolapse)     with moderate MR per echo in 2009  . HLD (hyperlipidemia)   . Sleep apnea     wears CPAP  . Normal cardiac stress test January 2014  . Tobacco abuse     Past Surgical History  Procedure Laterality Date  . Appendectomy    . Hernia repair    . Rotator cuff repair      History  Smoking status  . Former Smoker -- 1.00 packs/day for 30 years  . Start date: 04/12/1967  . Quit date: 03/06/2012  Smokeless tobacco  . Current User    History  Alcohol Use No    Family History  Problem Relation Age of Onset  . Cancer    . Hypertension    . Cancer Mother     Reviw  of Systems:  Reviewed in the HPI.  All other systems are negative.  Physical Exam: Blood pressure 144/86, pulse 43, height 6' (1.829 m), weight 227 lb (102.967 kg). General: Well developed, well nourished, in no acute distress.  Head: Normocephalic, atraumatic, sclera non-icteric, mucus membranes are moist,   Neck: Supple. Carotids are 2 + without bruits. No JVD  Lungs: Clear bilaterally to auscultation.  Heart: regular rate.  normal  S1 S2. There is a soft 2/6 systolic murmur at the axilla.  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  Abdomen: Soft, non-tender, non-distended with normal bowel sounds. No hepatomegaly. No rebound/guarding. No masses.  Msk:  Strength and tone are normal  Extremities: No clubbing or cyanosis. No edema.  Distal pedal pulses are 2+ and equal bilaterally.  Neuro: Alert and oriented X 3. Moves all extremities spontaneously.  Psych:  Responds to questions appropriately with a normal affect.  ECG: Nov. 10, 2014:  Marked sinus brady at 43. LAH, NS ST/ T wave changes  Assessment / Plan:

## 2012-12-22 NOTE — Patient Instructions (Signed)
Your physician wants you to follow-up in: 1 YEAR  You will receive a reminder letter in the mail two months in advance. If you don't receive a letter, please call our office to schedule the follow-up appointment.   Your physician recommends that you return for a FASTING lipid profile: 1 YEAR   

## 2013-10-27 ENCOUNTER — Encounter: Payer: Self-pay | Admitting: Cardiovascular Disease

## 2013-11-05 ENCOUNTER — Other Ambulatory Visit: Payer: Self-pay | Admitting: Internal Medicine

## 2013-11-05 DIAGNOSIS — F17200 Nicotine dependence, unspecified, uncomplicated: Secondary | ICD-10-CM

## 2013-11-11 ENCOUNTER — Ambulatory Visit (INDEPENDENT_AMBULATORY_CARE_PROVIDER_SITE_OTHER): Payer: BC Managed Care – PPO | Admitting: Neurology

## 2013-11-11 ENCOUNTER — Encounter: Payer: Self-pay | Admitting: *Deleted

## 2013-11-11 ENCOUNTER — Encounter (INDEPENDENT_AMBULATORY_CARE_PROVIDER_SITE_OTHER): Payer: Self-pay

## 2013-11-11 ENCOUNTER — Encounter: Payer: Self-pay | Admitting: Neurology

## 2013-11-11 VITALS — BP 132/85 | HR 49 | Resp 16 | Ht 72.0 in | Wt 234.0 lb

## 2013-11-11 DIAGNOSIS — Z9989 Dependence on other enabling machines and devices: Secondary | ICD-10-CM

## 2013-11-11 DIAGNOSIS — F17210 Nicotine dependence, cigarettes, uncomplicated: Secondary | ICD-10-CM

## 2013-11-11 DIAGNOSIS — G4733 Obstructive sleep apnea (adult) (pediatric): Secondary | ICD-10-CM

## 2013-11-11 DIAGNOSIS — E669 Obesity, unspecified: Secondary | ICD-10-CM

## 2013-11-11 DIAGNOSIS — F17200 Nicotine dependence, unspecified, uncomplicated: Secondary | ICD-10-CM

## 2013-11-11 HISTORY — DX: Obstructive sleep apnea (adult) (pediatric): G47.33

## 2013-11-11 MED ORDER — ZOLPIDEM TARTRATE ER 12.5 MG PO TBCR: 12.5000 mg | EXTENDED_RELEASE_TABLET | Freq: Every evening | ORAL | Status: DC | PRN

## 2013-11-11 NOTE — Progress Notes (Signed)
SLEEP MEDICINE CLINIC   Provider:  Larey [name], M D  Referring Provider: Jerlyn Ly, MD Primary Care Physician:  Michael Ly, MD  Chief Complaint  Patient presents with  . New Evaluation    RM 10  . Sleep Apnea    HPI:  Michael [name] is a 63 y.o. male  , who is seen here as a referral from Michael [name] for a sleep evaluation.    Michael [name] was diagnosed with obstructive sleep apnea approximately 20 years ago in [place].  6 years ago he was retested at [facility] sleep, but couldn't sleep.  The patient continued to use CPAP but had no supplier for parts.  He is compliant and feels that CPAP works well for him, but had no follow up by sleep MD for many years. The patient's study was from 2010 and was performed a year after he was diagnosed with a heart murmur and and abnormal echo 2009.   The patient also has a history of hiatal hernia with acid reflux, convulsions as a child possible febrile seizures, tinnitus, Mnire's disease, diverticulitis, hypertension. The patient is retired and used to work for [employer]. 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    Sleep habits are as follows: The patient will to bed around 10:00, usually watches TV and at that until he 'dozes off" shares the bedroom with his wife. Takes some nights ambien. This may take an hour.  Recently he begun waking up spontaneously at 5 AM and is not sure why. It is not nocturia but wakes him he's not in pain or any discomfort he is just wide-awake. His average hours of sleep at night are 6 hours. He has always been spontaneously waking  and does not rely on an alarm.  He feels refreshed and restored when first rising in the morning. He drinks decaffeinated coffee. He drinks soft drinks with caffeine. Around 1 or 2 PM he feels like he would like and now. If he is a little to now he will nap for about 30 minutes duration and feels refreshed. He does not wake up with headaches or  discomfort. He does not recall dreaming during his short naps.  His wife reported no sleep talking, walking. No snoring on CPAP and no apneas witnessed on CPAP.   The patient endorsed today the geriatric depression inventory at 2 points the Epworth sleepiness score at 5 and the fatigue severity score at 23 points.  No shift work history . Cardiologist Dr. Acie Fredrickson.    Review of Systems: Out of a complete 14 system review, the patient complains of only the following symptoms, and all other reviewed systems are negative. Daytime naps, fatigue after lunch. No nocturia.    History   Social History  . Marital Status: Married    Spouse Name: N/A    Number of Children: 0  . Years of Education: HS   Occupational History  . Not on file.   Social History Main Topics  . Smoking status: Light Tobacco Smoker -- 1.00 packs/day for 30 years    Start date: 04/12/1967    Last Attempt to Quit: 03/06/2012  . Smokeless tobacco: Current User  . Alcohol Use: No  . Drug Use: No  . Sexual Activity: Yes   Other Topics Concern  . Not on file   Social History Narrative   Patient is married, does not have children.   Patient is right handed.  Patient has high school education.   Patient does not drink caffeine.    Family History  Problem Relation Age of Onset  . Cancer  86    PANCREATIC CANCER  . Hypertension    . Cancer Mother     Past Medical History  Diagnosis Date  . MVP (mitral valve prolapse)     with moderate MR per echo in 2009  . HLD (hyperlipidemia)   . Sleep apnea     wears CPAP  . Normal cardiac stress test January 2014  . Tobacco abuse   . Hiatal hernia with gastroesophageal reflux   . Anal fissure   . Convulsions     AS A CHILD  . Heart murmur   . Anxiety   . Tinnitus   . Diverticulitis   . White coat hypertension     Past Surgical History  Procedure Laterality Date  . Appendectomy  2006  . Hernia repair    . Rotator cuff repair Left 2007    Current  Outpatient Prescriptions  Medication Sig Dispense Refill  . ALPRAZolam (XANAX) 0.5 MG tablet Take 0.5 mg by mouth as needed.        Marland Kitchen aspirin 81 MG tablet Take 81 mg by mouth daily.        . B Complex CAPS Take by mouth daily.        . cholecalciferol (VITAMIN D) 1000 UNITS tablet Take 1,000 Units by mouth daily.        Marland Kitchen FIBER COMPLETE PO Take by mouth daily.      Marland Kitchen loratadine (CLARITIN) 10 MG tablet Take 10 mg by mouth as needed.       Marland Kitchen losartan (COZAAR) 50 MG tablet Take 1 tablet (50 mg total) by mouth 2 (two) times daily.  60 tablet  6  . nebivolol (BYSTOLIC) 5 MG tablet Take 1 tablet (5 mg total) by mouth daily.  30 tablet  5  . pantoprazole (PROTONIX) 40 MG tablet Take 40 mg by mouth daily.      . sertraline (ZOLOFT) 50 MG tablet Take 50 mg by mouth daily.      . simvastatin (ZOCOR) 20 MG tablet Take 20 mg by mouth at bedtime.        Marland Kitchen zolpidem (AMBIEN) 10 MG tablet Take 10 mg by mouth at bedtime as needed for sleep.       No current facility-administered medications for this visit.    Allergies as of 11/11/2013  . (No Known Allergies)    Vitals: BP 132/85  Pulse 49  Resp 16  Ht 6' (1.829 m)  Wt 234 lb (106.142 kg)  BMI 31.73 kg/m2 Last Weight:  Wt Readings from Last 1 Encounters:  11/11/13 234 lb (106.142 kg)       Last Height:   Ht Readings from Last 1 Encounters:  11/11/13 6' (1.829 m)    Physical exam:  General: The patient is awake, alert and appears not in acute distress. The patient is well groomed. Head: Normocephalic, atraumatic. Neck is supple. Mallampati 3 ,  neck circumference: 19.5 . Nasal airflow congested  , TMJ is  Not  evident . Retrognathia is not seen. Patient is smoking.  Cardiovascular:  Regular rate and rhythm , without distended neck veins. Respiratory: Lungs are clear to auscultation. Skin:  Without evidence of edema, or rash Trunk: BMI is  elevated and patient  has normal posture.  Neurologic exam : The patient is awake and alert,  oriented to place  and time.   Memory subjective  described as intact. There is a normal attention span & concentration ability. Speech is fluent without dysarthria, dysphonia or aphasia.  Mood and affect are appropriate.  Cranial nerves: Pupils are equal and briskly reactive to light. Funduscopic exam without evidence of pallor or edema.  Extraocular movements  in vertical and horizontal planes intact and without nystagmus. Visual fields by finger perimetry are intact. Hearing to finger rub intact.  Facial sensation intact to fine touch. Facial motor strength is symmetric and tongue and uvula move midline.  Motor exam:  Normal tone ,muscle bulk and symmetric ROM and strength in all extremities.  Sensory:  Fine touch, pinprick and vibration were normal .  Coordination: Rapid alternating movements in the fingers/hands is normal. Finger-to-nose maneuver  normal without evidence of ataxia, dysmetria or tremor.  Gait and station: Patient walks without assistive device . Deep tendon reflexes: in the  upper and lower extremities are symmetric and intact.   Assessment:  After physical and neurologic examination, review of laboratory studies, imaging, neurophysiology testing and pre-existing records, assessment is   Patient with elevated BMI, neck size and inner air way restriction- needs retitration to CPAP, confirm need of machine. Needs supplies.  The current machine is not downloadable.    The patient was advised of the nature of the diagnosed sleep disorder , the treatment options and risks for general a health and wellness arising from not treating the condition. Visit duration was  45 minutes.   Plan:  Treatment plan and additional workup : SPLIT at AHI 15 and score at 3 %, Co2 for smoker, obese. ambien prn user, refill.      Asencion Partridge Joeann Steppe MD  11/11/2013

## 2013-11-11 NOTE — Patient Instructions (Signed)
Sleep Apnea  Sleep apnea is a sleep disorder characterized by abnormal pauses in breathing while you sleep. When your breathing pauses, the level of oxygen in your blood decreases. This causes you to move out of deep sleep and into light sleep. As a result, your quality of sleep is poor, and the system that carries your blood throughout your body (cardiovascular system) experiences stress. If sleep apnea remains untreated, the following conditions can develop:  High blood pressure (hypertension).  Coronary artery disease.  Inability to achieve or maintain an erection (impotence).  Impairment of your thought process (cognitive dysfunction). There are three types of sleep apnea: 1. Obstructive sleep apnea--Pauses in breathing during sleep because of a blocked airway. 2. Central sleep apnea--Pauses in breathing during sleep because the area of the brain that controls your breathing does not send the correct signals to the muscles that control breathing. 3. Mixed sleep apnea--A combination of both obstructive and central sleep apnea. RISK FACTORS The following risk factors can increase your risk of developing sleep apnea:  Being overweight.  Smoking.  Having narrow passages in your nose and throat.  Being of older age.  Being male.  Alcohol use.  Sedative and tranquilizer use.  Ethnicity. Among individuals younger than 35 years, African Americans are at increased risk of sleep apnea. SYMPTOMS   Difficulty staying asleep.  Daytime sleepiness and fatigue.  Loss of energy.  Irritability.  Loud, heavy snoring.  Morning headaches.  Trouble concentrating.  Forgetfulness.  Decreased interest in sex. DIAGNOSIS  In order to diagnose sleep apnea, your caregiver will perform a physical examination. Your caregiver may suggest that you take a home sleep test. Your caregiver may also recommend that you spend the night in a sleep lab. In the sleep lab, several monitors record  information about your heart, lungs, and brain while you sleep. Your leg and arm movements and blood oxygen level are also recorded. TREATMENT The following actions may help to resolve mild sleep apnea:  Sleeping on your side.   Using a decongestant if you have nasal congestion.   Avoiding the use of depressants, including alcohol, sedatives, and narcotics.   Losing weight and modifying your diet if you are overweight. There also are devices and treatments to help open your airway:  Oral appliances. These are custom-made mouthpieces that shift your lower jaw forward and slightly open your bite. This opens your airway.  Devices that create positive airway pressure. This positive pressure "splints" your airway open to help you breathe better during sleep. The following devices create positive airway pressure:  Continuous positive airway pressure (CPAP) device. The CPAP device creates a continuous level of air pressure with an air pump. The air is delivered to your airway through a mask while you sleep. This continuous pressure keeps your airway open.  Nasal expiratory positive airway pressure (EPAP) device. The EPAP device creates positive air pressure as you exhale. The device consists of single-use valves, which are inserted into each nostril and held in place by adhesive. The valves create very little resistance when you inhale but create much more resistance when you exhale. That increased resistance creates the positive airway pressure. This positive pressure while you exhale keeps your airway open, making it easier to breath when you inhale again.  Bilevel positive airway pressure (BPAP) device. The BPAP device is used mainly in patients with central sleep apnea. This device is similar to the CPAP device because it also uses an air pump to deliver continuous air pressure   through a mask. However, with the BPAP machine, the pressure is set at two different levels. The pressure when you  exhale is lower than the pressure when you inhale.  Surgery. Typically, surgery is only done if you cannot comply with less invasive treatments or if the less invasive treatments do not improve your condition. Surgery involves removing excess tissue in your airway to create a wider passage way. Document Released: 01/19/2002 Document Revised: 05/26/2012 Document Reviewed: 06/07/2011 ExitCare Patient Information 2015 [employer]. This information is not intended to replace advice given to you by your health care provider. Make sure you discuss any questions you have with your health care provider.  

## 2013-11-12 ENCOUNTER — Ambulatory Visit
Admission: RE | Admit: 2013-11-12 | Discharge: 2013-11-12 | Disposition: A | Discharge status: Home / Self Care | Payer: No Typology Code available for payment source | Source: Ambulatory Visit | Attending: Internal Medicine | Admitting: Internal Medicine

## 2013-11-12 DIAGNOSIS — F17200 Nicotine dependence, unspecified, uncomplicated: Secondary | ICD-10-CM

## 2013-12-16 ENCOUNTER — Ambulatory Visit (INDEPENDENT_AMBULATORY_CARE_PROVIDER_SITE_OTHER): Payer: BC Managed Care – PPO | Admitting: Neurology

## 2013-12-16 DIAGNOSIS — F17210 Nicotine dependence, cigarettes, uncomplicated: Secondary | ICD-10-CM

## 2013-12-16 DIAGNOSIS — E669 Obesity, unspecified: Secondary | ICD-10-CM

## 2013-12-16 DIAGNOSIS — G4733 Obstructive sleep apnea (adult) (pediatric): Secondary | ICD-10-CM

## 2013-12-16 DIAGNOSIS — Z9989 Dependence on other enabling machines and devices: Secondary | ICD-10-CM

## 2013-12-17 NOTE — Sleep Study (Signed)
Please see the scanned sleep study interpretation located in the Procedure tab within the Chart Review section. 

## 2013-12-23 ENCOUNTER — Encounter: Payer: Self-pay | Admitting: *Deleted

## 2013-12-23 ENCOUNTER — Encounter: Payer: Self-pay | Admitting: Neurology

## 2013-12-23 ENCOUNTER — Other Ambulatory Visit: Payer: Self-pay | Admitting: Neurology

## 2013-12-23 ENCOUNTER — Telehealth: Payer: Self-pay | Admitting: *Deleted

## 2013-12-23 DIAGNOSIS — G4733 Obstructive sleep apnea (adult) (pediatric): Secondary | ICD-10-CM

## 2013-12-23 NOTE — Telephone Encounter (Signed)
Patient called and was provided the results of his split night sleep study which revealed sleep apnea.  Patient was referred to [facility] for CPAP set up.  Patient was mailed a copy of the results and Dr. [name] was faxed a copy of report.

## 2014-03-17 ENCOUNTER — Encounter: Payer: Self-pay | Admitting: Neurology

## 2014-03-31 ENCOUNTER — Encounter: Payer: Self-pay | Admitting: Neurology

## 2014-03-31 ENCOUNTER — Ambulatory Visit (INDEPENDENT_AMBULATORY_CARE_PROVIDER_SITE_OTHER): Payer: BLUE CROSS/BLUE SHIELD | Admitting: Neurology

## 2014-03-31 VITALS — BP 135/79 | HR 49 | Resp 18 | Wt 228.6 lb

## 2014-03-31 DIAGNOSIS — F17200 Nicotine dependence, unspecified, uncomplicated: Secondary | ICD-10-CM | POA: Insufficient documentation

## 2014-03-31 DIAGNOSIS — G4733 Obstructive sleep apnea (adult) (pediatric): Secondary | ICD-10-CM

## 2014-03-31 DIAGNOSIS — Z720 Tobacco use: Secondary | ICD-10-CM

## 2014-03-31 HISTORY — DX: Nicotine dependence, unspecified, uncomplicated: F17.200

## 2014-03-31 NOTE — Patient Instructions (Signed)
You Can Quit Smoking If you are ready to quit smoking or are thinking about it, congratulations! You have chosen to help yourself be healthier and live longer! There are lots of different ways to quit smoking. Nicotine gum, nicotine patches, a nicotine inhaler, or nicotine nasal spray can help with physical craving. Hypnosis, support groups, and medicines help break the habit of smoking. TIPS TO GET OFF AND STAY OFF CIGARETTES  Learn to predict your moods. Do not let a bad situation be your excuse to have a cigarette. Some situations in your life might tempt you to have a cigarette.  Ask friends and co-workers not to smoke around you.  Make your home smoke-free.  Never have "just one" cigarette. It leads to wanting another and another. Remind yourself of your decision to quit.  On a card, make a list of your reasons for not smoking. Read it at least the same number of times a day as you have a cigarette. Tell yourself everyday, "I do not want to smoke. I choose not to smoke."  Ask someone at home or work to help you with your plan to quit smoking.  Have something planned after you eat or have a cup of coffee. Take a walk or get other exercise to perk you up. This will help to keep you from overeating.  Try a relaxation exercise to calm you down and decrease your stress. Remember, you may be tense and nervous the first two weeks after you quit. This will pass.  Find new activities to keep your hands busy. Play with a pen, coin, or rubber band. Doodle or draw things on paper.  Brush your teeth right after eating. This will help cut down the craving for the taste of tobacco after meals. You can try mouthwash too.  Try gum, breath mints, or diet candy to keep something in your mouth. IF YOU SMOKE AND WANT TO QUIT:  Do not stock up on cigarettes. Never buy a carton. Wait until one pack is finished before you buy another.  Never carry cigarettes with you at work or at home.  Keep cigarettes  as far away from you as possible. Leave them with someone else.  Never carry matches or a lighter with you.  Ask yourself, "Do I need this cigarette or is this just a reflex?"  Bet with someone that you can quit. Put cigarette money in a piggy bank every morning. If you smoke, you give up the money. If you do not smoke, by the end of the week, you keep the money.  Keep trying. It takes 21 days to change a habit!  Talk to your doctor about using medicines to help you quit. These include nicotine replacement gum, lozenges, or skin patches. Document Released: 11/25/2008 Document Revised: 04/23/2011 Document Reviewed: 11/25/2008 ExitCare Patient Information 2015 [employer]. This information is not intended to replace advice given to you by your health care provider. Make sure you discuss any questions you have with your health care provider.  

## 2014-03-31 NOTE — Progress Notes (Signed)
SLEEP MEDICINE CLINIC   Provider:  Larey Seat, M D  Referring Provider: Jerlyn Ly, MD Primary Care Physician:  Jerlyn Ly, MD  Chief Complaint  Patient presents with  . RV cpap    Rm 11, alone    HPI:  Michael [name] is a 64 y.o. male  , who is seen here as a referral from Dr. Joylene Draft for a sleep evaluation.    Mr. Shibata was diagnosed with obstructive sleep apnea approximately 20 years ago in [place].  6 years ago he was retested at [facility] sleep, but couldn't sleep.  The patient continued to use CPAP but had no supplier for parts.  He is compliant and feels that CPAP works well for him, but had no follow up by sleep MD for many years. The patient's study was from 2010 and was performed a year after he was diagnosed with a heart murmur and and abnormal echo 2009.   The patient also has a history of hiatal hernia with acid reflux, convulsions as a child possible febrile seizures, tinnitus, Mnire's disease, diverticulitis, hypertension. The patient is retired and used to work for [employer]. 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    Sleep habits are as follows: The patient will to bed around 10:00, usually watches TV and at that until he 'dozes off" shares the bedroom with his wife. Takes some nights ambien. This may take an hour.  Recently he begun waking up spontaneously at 5 AM and is not sure why. It is not nocturia but wakes him he's not in pain or any discomfort he is just wide-awake. His average hours of sleep at night are 6 hours. He has always been spontaneously waking  and does not rely on an alarm.  He feels refreshed and restored when first rising in the morning. He drinks decaffeinated coffee. He drinks soft drinks with caffeine. Around 1 or 2 PM he feels like he would like and now. If he is a little to now he will nap for about 30 minutes duration and feels refreshed. He does not wake up with headaches or discomfort. He does  not recall dreaming during his short naps.  His wife reported no sleep talking, walking. No snoring on CPAP and no apneas witnessed on CPAP.  03-31-14 Mr. Mcbean underwent a polysomnography on 12-16-13 at [facility] [place]. The patient's was diagnosed with obstructive sleep apnea at an AHI of 23.9 and an RDI of 34.2 per hour in addition there were several so-called respiratory event related arousals indicating upper airway resistance he syndrome. The patient also had some periodic limb movements and kicked himself awake or at least out of deep sleep several times at night. The arousal index was very high at 40.5 per hour the PLM arousal index was 9.9. His heart rate was slow normal sinus rhythm. The patient was titrated to 8 cm water pressure which resolved his apnea a ResMed nasal pillow mask a so-called air-fluid P 10 in medium size was used. He regained a normal sleep architecture under CPAP use. The significant decrease in arousals and periodic limb movements as well. There was significant REM rebound noted 1 CPAP was initiated.  Today's compliance report shows an AHI of 1.4 the set pressure of 8 cm water was 1 cm EPR. Average user time is 7 hours and 16 minutes the patient alternates between to CPAP of which one is more trouble friendly. He used this machine  22 out of 22 days with 100% compliance over 4 hours. He used his alternative machines in the week prior with similar download results. His Epworth sleepiness score today is endorsed at 2 points and his fatigue at 26 points. He has cut down on tobacco use, he has not gained weight.  He travelled to [place] on a mission, building a  [name].       Review of Systems: Out of a complete 14 system review, the patient complains of only the following symptoms, and all other reviewed systems are negative. Daytime naps, fatigue after lunch. No nocturia. The patient endorsed today the geriatric depression inventory at 2 points the  Epworth sleepiness score at 2 from 5 and the fatigue severity score at 26 from 23 points.  No shift work history .   History   Social History  . Marital Status: Married    Spouse Name: N/A  . Number of Children: 0  . Years of Education: HS   Occupational History  . Not on file.   Social History Main Topics  . Smoking status: Light Tobacco Smoker -- 1.00 packs/day for 30 years    Start date: 04/12/1967    Last Attempt to Quit: 03/06/2012  . Smokeless tobacco: Current User  . Alcohol Use: No  . Drug Use: No  . Sexual Activity: Yes   Other Topics Concern  . Not on file   Social History Narrative   Patient is married, does not have children.   Patient is right handed.   Patient has high school education.   Patient does not drink caffeine.    Family History  Problem Relation Age of Onset  . Cancer  86    PANCREATIC CANCER  . Hypertension    . Cancer Mother     Past Medical History  Diagnosis Date  . MVP (mitral valve prolapse)     with moderate MR per echo in 2009  . HLD (hyperlipidemia)   . Sleep apnea     wears CPAP  . Normal cardiac stress test January 2014  . Tobacco abuse   . Hiatal hernia with gastroesophageal reflux   . Anal fissure   . Convulsions     AS A CHILD  . Heart murmur   . Anxiety   . Tinnitus   . Diverticulitis   . White coat hypertension   . OSA on CPAP 11/11/2013  . Tobacco use disorder 03/31/2014    Past Surgical History  Procedure Laterality Date  . Appendectomy  2006  . Hernia repair    . Rotator cuff repair Left 2007    Current Outpatient Prescriptions  Medication Sig Dispense Refill  . ALPRAZolam (XANAX) 0.5 MG tablet Take 0.5 mg by mouth as needed.      Marland Kitchen aspirin 81 MG tablet Take 81 mg by mouth daily.      . B Complex CAPS Take by mouth daily.      . cholecalciferol (VITAMIN D) 1000 UNITS tablet Take 1,000 Units by mouth daily.      Marland Kitchen FIBER COMPLETE PO Take by mouth daily.    Marland Kitchen loratadine (CLARITIN) 10 MG tablet Take 10  mg by mouth as needed.     Marland Kitchen losartan (COZAAR) 50 MG tablet Take 1 tablet (50 mg total) by mouth 2 (two) times daily. 60 tablet 6  . nebivolol (BYSTOLIC) 5 MG tablet Take 1 tablet (5 mg total) by mouth daily. 30 tablet 5  . pantoprazole (PROTONIX) 40 MG tablet  Take 40 mg by mouth daily.    . sertraline (ZOLOFT) 50 MG tablet Take 50 mg by mouth daily.    . simvastatin (ZOCOR) 20 MG tablet Take 20 mg by mouth at bedtime.      Marland Kitchen zolpidem (AMBIEN CR) 12.5 MG CR tablet Take 1 tablet (12.5 mg total) by mouth at bedtime as needed for sleep. 30 tablet 0   No current facility-administered medications for this visit.    Allergies as of 03/31/2014  . (No Known Allergies)    Vitals: BP 135/79 mmHg  Pulse 49  Resp 18  Wt 228 lb 9.6 oz (103.692 kg) Last Weight:  Wt Readings from Last 1 Encounters:  03/31/14 228 lb 9.6 oz (103.692 kg)       Last Height:   Ht Readings from Last 1 Encounters:  11/11/13 6' (1.829 m)    Physical exam:  General: The patient is awake, alert and appears not in acute distress. The patient is well groomed. Head: Normocephalic, atraumatic. Neck is supple. Mallampati 3 ,  neck circumference: 19.5 . Nasal airflow congested  , TMJ is  Not  evident . Retrognathia is not seen. Patient is smoking.  Cardiovascular:  Regular rate and rhythm , without distended neck veins. Respiratory: Lungs are clear to auscultation. Skin:  Without evidence of edema, or rash Trunk: BMI is  elevated and patient  has normal posture.  Neurologic exam : The patient is awake and alert, oriented to place and time.   Memory subjective  described as intact. There is a normal attention span & concentration ability. Speech is fluent without dysarthria, dysphonia or aphasia.  Mood and affect are appropriate.  Cranial nerves: Pupils are equal and briskly reactive to light. Funduscopic exam without evidence of pallor or edema.  Extraocular movements  in vertical and horizontal planes intact and  without nystagmus. Visual fields by finger perimetry are intact. Hearing to finger rub intact.  Facial sensation intact to fine touch. Facial motor strength is symmetric and tongue and uvula move midline.  Motor exam:  Normal tone ,muscle bulk and symmetric ROM and strength in all extremities. Sensory:  Fine touch, pinprick and vibration were normal . Coordination: Rapid alternating movements in the fingers/hands is normal. Finger-to-nose maneuver  normal without evidence of ataxia, dysmetria or tremor. Gait and station: Patient walks without assistive device . Deep tendon reflexes: in the  upper and lower extremities are symmetric and intact.   Assessment:  After physical and neurologic examination, review of laboratory studies, imaging, neurophysiology testing and pre-existing records, assessment is  Patient with elevated BMI, neck size and inner air way restriction- needs retitration to CPAP, confirm need of machine. Needs supplies.  The old  machine was not downloadable, but he travels with it, his new machine is working well for him. .  The patient was advised of the nature of the diagnosed sleep disorder , the treatment options and risks for general a health and wellness arising from not treating the condition.  Visit duration was 15 minutes.   Plan:  Treatment plan and additional workup : continue CPAP use at 100%. compliance             Given Tobacco use cessation advise.      Asencion Partridge Penelope Fittro MD  03/31/2014

## 2014-09-29 ENCOUNTER — Telehealth: Payer: Self-pay | Admitting: Neurology

## 2014-09-29 NOTE — Telephone Encounter (Signed)
Please write the letter for me- I am sure Inez Catalina knows what it needs to say, most have a standard letter they give use to state their needs. CD

## 2014-09-29 NOTE — Telephone Encounter (Signed)
Called and spoke with Danae Chen from [facility] and she transferred me to [place]. She is going to fax over letter for Dr. Brett Fairy to sign for medical necessity. Pt switched insurance from commercial coverage to Medicare and needs to submit a letter saying they still need equipment. Her direct number is [phone]. She is going to fax letter to [phone] for Dr. Brett Fairy to sign.

## 2014-09-29 NOTE — Telephone Encounter (Signed)
Marti from [facility] called and would like a letter of medical necessity sent to them Fax # [phone] Attention : Inez Catalina

## 2014-09-30 NOTE — Telephone Encounter (Signed)
Spoke with Michael [name] at [facility], she says that it looks like to her that nothing further is needed from [place], but they will contact us if there is something else.

## 2014-10-04 NOTE — Telephone Encounter (Signed)
Letter signed and faxed. cd

## 2014-10-11 ENCOUNTER — Telehealth: Payer: Self-pay | Admitting: Neurology

## 2014-10-11 NOTE — Telephone Encounter (Signed)
Pt called to schedule an appt for a cpap f/u due to insurance changes. He was told by [facility] that he needed to come in. He wants to know if he still needs appt. And if not can [facility] be notified of is compliance. May fax [phone]. Attention Marti. Please call and advise [phone]. A tentative appt has been made for Sept. 27th

## 2014-10-11 NOTE — Telephone Encounter (Signed)
Spoke to pt. He claims that since he started Medicare in 08/2014, pt need another office visit to prove to medicare that he needs the cpap machine. He wants to keep the 9/27 appt.

## 2014-11-09 ENCOUNTER — Ambulatory Visit (INDEPENDENT_AMBULATORY_CARE_PROVIDER_SITE_OTHER): Payer: Medicare Other | Admitting: Neurology

## 2014-11-09 ENCOUNTER — Encounter: Payer: Self-pay | Admitting: Neurology

## 2014-11-09 VITALS — BP 150/80 | HR 62 | Resp 20 | Ht 71.0 in | Wt 230.0 lb

## 2014-11-09 DIAGNOSIS — G4733 Obstructive sleep apnea (adult) (pediatric): Secondary | ICD-10-CM | POA: Diagnosis not present

## 2014-11-09 DIAGNOSIS — Z9989 Dependence on other enabling machines and devices: Principal | ICD-10-CM

## 2014-11-09 NOTE — Progress Notes (Signed)
SLEEP MEDICINE CLINIC   Michael [name]:  Michael [name], M D  Referring Michael [name]: Michael Infante, MD Primary Care Physician:  Michael Ly, MD  Chief Complaint  Patient presents with  . Follow-up    cpap f/u, for insurance purposes, changed to medicare, rm 11, alone    HPI:  Michael [name] is a 64 y.o. male  , who is seen here as a referral from Dr. Joylene Draft for a sleep evaluation.    :Michael [name] was diagnosed with obstructive sleep apnea approximately 20 years ago in [name].  6 years ago he was retested at [facility] sleep, but couldn't sleep.  The patient continued to use CPAP but had no supplier for parts.  He is compliant and feels that CPAP works well for him, but had no follow up by sleep MD for many years. The patient's study was from 2010 and was performed a year after he was diagnosed with a heart murmur and and abnormal echo 2009.  The patient also has a history of hiatal hernia with acid reflux, convulsions as a child possible febrile seizures, tinnitus, Mnire's disease, diverticulitis, hypertension. The patient is retired and used to work for [employer]. 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    Sleep habits are as follows: The patient will to bed around 10:00, usually watches TV and at that until he 'dozes off" shares the bedroom with his wife. Takes some nights ambien. This may take an hour.  Recently he begun waking up spontaneously at 5 AM and is not sure why. It is not nocturia but wakes him he's not in pain or any discomfort he is just wide-awake. His average hours of sleep at night are 6 hours. He has always been spontaneously waking  and does not rely on an alarm.  He feels refreshed and restored when first rising in the morning. He drinks decaffeinated coffee. He drinks soft drinks with caffeine. Around 1 or 2 PM he feels like he would like and now. If he is a little to now he will nap for about 30 minutes duration and feels refreshed. He  does not wake up with headaches or discomfort. He does not recall dreaming during his short naps.  His wife reported no sleep talking, walking. No snoring on CPAP and no apneas witnessed on CPAP.  03-31-14 Michael [name] underwent a polysomnography on 12-16-13 at [name]. The patient's was diagnosed with obstructive sleep apnea at an AHI of 23.9 and an RDI of 34.2 per hour in addition there were several so-called respiratory event related arousals indicating upper airway resistance he syndrome. The patient also had some periodic limb movements and kicked himself awake or at least out of deep sleep several times at night. The arousal index was very high at 40.5 per hour the PLM arousal index was 9.9. His heart rate was slow normal sinus rhythm. The patient was titrated to 8 cm water pressure which resolved his apnea a ResMed nasal pillow mask a so-called air-fluid P 10 in medium size was used. He regained a normal sleep architecture under CPAP use. The significant decrease in arousals and periodic limb movements as well. There was significant REM rebound noted 1 CPAP was initiated. Today's compliance report shows an AHI of 1.4 the set pressure of 8 cm water was 1 cm EPR. Average user time is 7 hours and 16 minutes the patient alternates between to CPAP of which one is more trouble friendly.  He used this machine 22 out of 22 days with 100% compliance over 4 hours. He used his alternative machines in the week prior with similar download results. His Epworth sleepiness score today is endorsed at 2 points and his fatigue at 26 points. He has cut down on tobacco use, he has not gained weight.  He travelled to [place] on a mission, building a [name].   11-09-14 Michael [name] is here on his first visit since entering Medicare. [facility] his primary medical equipment company asked him to have a follow-up with his sleep physician to gain coverage through the Medicare system. He is  here to document again his compliance. A download was obtained dated 9-20 5-16 which was 100% compliance was 30 out of 30 days all those days was over 4 hours or fumes. Average user time is 9 hours and 41 minutes. The machine is set at 8 cm water was 1 cm EPR and a residual AHI of 1.2, which is close to an ideal result. He does not have significant air leaks on the on rare occasions. His Epworth sleepiness score is endorsed at 3 points fatigue severity at 25 points and his geriatric depression inventory at 0 points.    Review of Systems: Out of a complete 14 system review, the patient complains of only the following symptoms, and all other reviewed systems are negative. Daytime naps, fatigue after lunch. No nocturia.His Epworth sleepiness score is endorsed at 3 points fatigue severity at 25 points and his geriatric depression inventory at 0 points.   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   Social History   Social History  . Marital Status: Married    Spouse Name: N/A  . Number of Children: 0  . Years of Education: HS   Occupational History  . Not on file.   Social History Main Topics  . Smoking status: Light Tobacco Smoker -- 1.00 packs/day for 30 years    Start date: 04/12/1967    Last Attempt to Quit: 03/06/2012  . Smokeless tobacco: Current User  . Alcohol Use: No  . Drug Use: No  . Sexual Activity: Yes   Other Topics Concern  . Not on file   Social History Narrative   Patient is married, does not have children.   Patient is right handed.   Patient has high school education.   Patient does not drink caffeine.    Family History  Problem Relation Age of Onset  . Cancer  86    PANCREATIC CANCER  . Hypertension    . Cancer Mother     Past Medical History  Diagnosis Date  . MVP (mitral valve prolapse)     with moderate MR per echo in 2009  . HLD (hyperlipidemia)   . Sleep apnea     wears CPAP  . Normal cardiac stress  test January 2014  . Tobacco abuse   . Hiatal hernia with gastroesophageal reflux   . Anal fissure   . Convulsions     AS A CHILD  . Heart murmur   . Anxiety   . Tinnitus   . Diverticulitis   . White coat hypertension   . OSA on CPAP 11/11/2013  . Tobacco use disorder 03/31/2014    Past Surgical History  Procedure Laterality Date  . Appendectomy  2006  . Hernia repair    . Rotator cuff repair Left 2007    Current Outpatient Prescriptions  Medication Sig Dispense Refill  . ALPRAZolam (XANAX) 0.5 MG tablet Take 0.5 mg by mouth as needed.      Marland Kitchen aspirin 81 MG tablet Take 81 mg by mouth daily.      . B Complex CAPS Take by mouth daily.      . cholecalciferol (VITAMIN D) 1000 UNITS tablet Take 1,000 Units by mouth daily.      Marland Kitchen FIBER COMPLETE PO Take by mouth daily.    Marland Kitchen loratadine (CLARITIN) 10 MG tablet Take 10 mg by mouth as needed.     Marland Kitchen losartan (COZAAR) 50 MG tablet Take 1 tablet (50 mg total) by mouth 2 (two) times daily. 60 tablet 6  . nebivolol (BYSTOLIC) 5 MG tablet Take 1 tablet (5 mg total) by mouth daily. 30 tablet 5  . pantoprazole (PROTONIX) 40 MG tablet Take 40 mg by mouth daily.    . sertraline (ZOLOFT) 50 MG tablet Take 50 mg by mouth daily.    . simvastatin (ZOCOR) 20 MG tablet Take 20 mg by mouth at bedtime.      Marland Kitchen zolpidem (AMBIEN CR) 12.5 MG CR tablet Take 1 tablet (12.5 mg total) by mouth at bedtime as needed for sleep. 30 tablet 0   No current facility-administered medications for this visit.    Allergies as of 11/09/2014  . (No Known Allergies)    Vitals: BP 150/80 mmHg  Pulse 62  Resp 20  Ht 5\' 11"  (1.803 m)  Wt 230 lb (104.327 kg)  BMI 32.09 kg/m2 Last Weight:  Wt Readings from Last 1 Encounters:  11/09/14 230 lb (104.327 kg)       Last Height:   Ht Readings from Last 1 Encounters:  11/09/14 5\' 11"  (1.803 m)    Physical exam:  General: The patient is awake, alert and appears not in acute distress.  The patient is well groomed. Head:  Normocephalic, atraumatic. Neck is supple. Mallampati 3 ,  neck circumference: 19.0 inches . Nasal airflow congested  , TMJ is  Not  evident . Retrognathia is not seen. Patient is smoking.  Cardiovascular:  Regular rate and rhythm , without distended neck veins. Respiratory: Lungs are clear to auscultation. Skin:  Without evidence of edema, or rash Trunk: BMI is  elevated and patient  has normal posture.  Neurologic exam : The patient is awake and alert, oriented to place and time.   Memory subjective  described as intact.   Cranial nerves: Pupils are equal and briskly reactive to light. Hearing to finger rub intact.  Facial sensation intact to fine touch. Facial motor strength is symmetric and tongue and uvula move midline.  Motor exam:  Normal tone ,muscle bulk and symmetric ROM and strength in all extremities. Sensory:  Fine touch, pinprick and vibration were normal . Deep tendon reflexes: in the  upper and lower extremities are symmetric and intact.   Assessment:  After physical and neurologic examination, review of laboratory studies, imaging, neurophysiology testing and pre-existing records, assessment is  Highly compliant by CMS criteria , The patient was advised of the nature of the diagnosed sleep disorder , the treatment options and risks for general a health and wellness arising from not treating the condition.  Visit duration was 15 minutes.  More than 10 minutes were face to face discussion and arranging follow up with DME> no settings were changed.   Plan:  Treatment plan and additional workup : continue CPAP use at 100%. compliance  Given Tobacco use cessation advise.    Rv in 2 month     Asencion Partridge Dohmeier MD  11/09/2014   [facility] PCP is Dr Joylene Draft.

## 2014-11-09 NOTE — Patient Instructions (Signed)

## 2014-12-08 DIAGNOSIS — Z125 Encounter for screening for malignant neoplasm of prostate: Secondary | ICD-10-CM | POA: Diagnosis not present

## 2014-12-08 DIAGNOSIS — R7301 Impaired fasting glucose: Secondary | ICD-10-CM | POA: Diagnosis not present

## 2014-12-08 DIAGNOSIS — E785 Hyperlipidemia, unspecified: Secondary | ICD-10-CM | POA: Diagnosis not present

## 2014-12-08 DIAGNOSIS — I10 Essential (primary) hypertension: Secondary | ICD-10-CM | POA: Diagnosis not present

## 2014-12-15 DIAGNOSIS — Z1389 Encounter for screening for other disorder: Secondary | ICD-10-CM | POA: Diagnosis not present

## 2014-12-15 DIAGNOSIS — E669 Obesity, unspecified: Secondary | ICD-10-CM | POA: Diagnosis not present

## 2014-12-15 DIAGNOSIS — I10 Essential (primary) hypertension: Secondary | ICD-10-CM | POA: Diagnosis not present

## 2014-12-15 DIAGNOSIS — R030 Elevated blood-pressure reading, without diagnosis of hypertension: Secondary | ICD-10-CM | POA: Diagnosis not present

## 2014-12-15 DIAGNOSIS — R7301 Impaired fasting glucose: Secondary | ICD-10-CM | POA: Diagnosis not present

## 2014-12-15 DIAGNOSIS — M549 Dorsalgia, unspecified: Secondary | ICD-10-CM | POA: Diagnosis not present

## 2014-12-15 DIAGNOSIS — I341 Nonrheumatic mitral (valve) prolapse: Secondary | ICD-10-CM | POA: Diagnosis not present

## 2014-12-15 DIAGNOSIS — E785 Hyperlipidemia, unspecified: Secondary | ICD-10-CM | POA: Diagnosis not present

## 2014-12-15 DIAGNOSIS — Z6831 Body mass index (BMI) 31.0-31.9, adult: Secondary | ICD-10-CM | POA: Diagnosis not present

## 2014-12-15 DIAGNOSIS — Z0000 Encounter for general adult medical examination without abnormal findings: Secondary | ICD-10-CM | POA: Diagnosis not present

## 2014-12-15 DIAGNOSIS — M25562 Pain in left knee: Secondary | ICD-10-CM | POA: Diagnosis not present

## 2014-12-15 DIAGNOSIS — K219 Gastro-esophageal reflux disease without esophagitis: Secondary | ICD-10-CM | POA: Diagnosis not present

## 2014-12-16 DIAGNOSIS — Z1212 Encounter for screening for malignant neoplasm of rectum: Secondary | ICD-10-CM | POA: Diagnosis not present

## 2015-01-27 ENCOUNTER — Telehealth: Payer: Self-pay | Admitting: Acute Care

## 2015-01-27 ENCOUNTER — Encounter: Payer: Self-pay | Admitting: Acute Care

## 2015-01-27 NOTE — Telephone Encounter (Signed)
Spoke with patient. Patient states he smokes 1/2ppd for 40 years. With this information patient does not qualify for the program. The program requires for the patient to be a 30 pack/year smoker. Patient understood.

## 2015-04-01 ENCOUNTER — Ambulatory Visit: Payer: BLUE CROSS/BLUE SHIELD | Admitting: Neurology

## 2015-08-29 DIAGNOSIS — H5203 Hypermetropia, bilateral: Secondary | ICD-10-CM | POA: Diagnosis not present

## 2015-08-29 DIAGNOSIS — H524 Presbyopia: Secondary | ICD-10-CM | POA: Diagnosis not present

## 2015-08-29 DIAGNOSIS — H2513 Age-related nuclear cataract, bilateral: Secondary | ICD-10-CM | POA: Diagnosis not present

## 2015-11-09 ENCOUNTER — Ambulatory Visit (INDEPENDENT_AMBULATORY_CARE_PROVIDER_SITE_OTHER): Payer: Medicare Other | Admitting: Neurology

## 2015-11-09 ENCOUNTER — Encounter: Payer: Self-pay | Admitting: Neurology

## 2015-11-09 VITALS — BP 150/80 | HR 44 | Resp 20 | Ht 71.0 in | Wt 222.0 lb

## 2015-11-09 DIAGNOSIS — F17200 Nicotine dependence, unspecified, uncomplicated: Secondary | ICD-10-CM | POA: Diagnosis not present

## 2015-11-09 DIAGNOSIS — G4733 Obstructive sleep apnea (adult) (pediatric): Secondary | ICD-10-CM | POA: Diagnosis not present

## 2015-11-09 DIAGNOSIS — I10 Essential (primary) hypertension: Secondary | ICD-10-CM | POA: Diagnosis not present

## 2015-11-09 DIAGNOSIS — Z9989 Dependence on other enabling machines and devices: Principal | ICD-10-CM

## 2015-11-09 NOTE — Patient Instructions (Signed)
Insomnia Insomnia is a sleep disorder that makes it difficult to fall asleep or to stay asleep. Insomnia can cause tiredness (fatigue), low energy, difficulty concentrating, mood swings, and poor performance at work or school.  There are three different ways to classify insomnia:  Difficulty falling asleep.  Difficulty staying asleep.  Waking up too early in the morning. Any type of insomnia can be long-term (chronic) or short-term (acute). Both are common. Short-term insomnia usually lasts for three months or less. Chronic insomnia occurs at least three times a week for longer than three months. CAUSES  Insomnia may be caused by another condition, situation, or substance, such as:  Anxiety.  Certain medicines.  Gastroesophageal reflux disease (GERD) or other gastrointestinal conditions.  Asthma or other breathing conditions.  Restless legs syndrome, sleep apnea, or other sleep disorders.  Chronic pain.  Menopause. This may include hot flashes.  Stroke.  Abuse of alcohol, tobacco, or illegal drugs.  Depression.  Caffeine.   Neurological disorders, such as Alzheimer disease.  An overactive thyroid (hyperthyroidism). The cause of insomnia may not be known. RISK FACTORS Risk factors for insomnia include:  Gender. Women are more commonly affected than men.  Age. Insomnia is more common as you get older.  Stress. This may involve your professional or personal life.  Income. Insomnia is more common in people with lower income.  Lack of exercise.   Irregular work schedule or night shifts.  Traveling between different time zones. SIGNS AND SYMPTOMS If you have insomnia, trouble falling asleep or trouble staying asleep is the main symptom. This may lead to other symptoms, such as:  Feeling fatigued.  Feeling nervous about going to sleep.  Not feeling rested in the morning.  Having trouble concentrating.  Feeling irritable, anxious, or depressed. TREATMENT   Treatment for insomnia depends on the cause. If your insomnia is caused by an underlying condition, treatment will focus on addressing the condition. Treatment may also include:   Medicines to help you sleep.  Counseling or therapy.  Lifestyle adjustments. HOME CARE INSTRUCTIONS   Take medicines only as directed by your health care provider.  Keep regular sleeping and waking hours. Avoid naps.  Keep a sleep diary to help you and your health care provider figure out what could be causing your insomnia. Include:   When you sleep.  When you wake up during the night.  How well you sleep.   How rested you feel the next day.  Any side effects of medicines you are taking.  What you eat and drink.   Make your bedroom a comfortable place where it is easy to fall asleep:  Put up shades or special blackout curtains to block light from outside.  Use a white noise machine to block noise.  Keep the temperature cool.   Exercise regularly as directed by your health care provider. Avoid exercising right before bedtime.  Use relaxation techniques to manage stress. Ask your health care provider to suggest some techniques that may work well for you. These may include:  Breathing exercises.  Routines to release muscle tension.  Visualizing peaceful scenes.  Cut back on alcohol, caffeinated beverages, and cigarettes, especially close to bedtime. These can disrupt your sleep.  Do not overeat or eat spicy foods right before bedtime. This can lead to digestive discomfort that can make it hard for you to sleep.  Limit screen use before bedtime. This includes:  Watching TV.  Using your smartphone, tablet, and computer.  Stick to a routine. This   can help you fall asleep faster. Try to do a quiet activity, brush your teeth, and go to bed at the same time each night.  Get out of bed if you are still awake after 15 minutes of trying to sleep. Keep the lights down, but try reading or  doing a quiet activity. When you feel sleepy, go back to bed.  Make sure that you drive carefully. Avoid driving if you feel very sleepy.  Keep all follow-up appointments as directed by your health care provider. This is important. SEEK MEDICAL CARE IF:   You are tired throughout the day or have trouble in your daily routine due to sleepiness.  You continue to have sleep problems or your sleep problems get worse. SEEK IMMEDIATE MEDICAL CARE IF:   You have serious thoughts about hurting yourself or someone else.   This information is not intended to replace advice given to you by your health care provider. Make sure you discuss any questions you have with your health care provider.   Document Released: 01/27/2000 Document Revised: 10/20/2014 Document Reviewed: 10/30/2013 Elsevier Interactive Patient Education 2016 Else[employer] Inc.  

## 2015-11-09 NOTE — Progress Notes (Signed)
SLEEP MEDICINE CLINIC   Provider:  Larey Seat, M D  Referring Provider: [name], MD Primary Care Physician:  Jerlyn Ly, MD  Chief Complaint  Patient presents with  . Follow-up    cpap going well, uses [facility]    HPI:  Michael [name] is a 65 y.o. male  , who is seen here as a referral from Dr. Joylene Draft for a sleep evaluation.    :Michael [name] was diagnosed with obstructive sleep apnea approximately 20 years ago in [place].  7 years ago he was retested at [facility] sleep, but couldn't sleep. The patient continued to use CPAP but had no supplier for parts. He is compliant and feels that CPAP works well for him, but had no follow up by sleep MD for many years.The patient's study was from 2010 and was performed a year after he was diagnosed with a heart murmur and and abnormal echo 2009.  The patient also has a history of hiatal hernia with acid reflux, convulsions as a child possible febrile seizures, tinnitus, Mnire's disease, diverticulitis, hypertension. The patient is retired and used to work for [employer]. 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    Sleep habits are as follows: The patient will to bed around 10:00, usually watches TV and at that until he 'dozes off" shares the bedroom with his wife. Takes some nights ambien. This may take an hour.  Recently he begun waking up spontaneously at 5 AM and is not sure why. It is not nocturia but wakes him he's not in pain or any discomfort he is just wide-awake. His average hours of sleep at night are 6 hours. He has always been spontaneously waking  and does not rely on an alarm.  He feels refreshed and restored when first rising in the morning. He drinks decaffeinated coffee. He drinks soft drinks with caffeine. Around 1 or 2 PM he feels like he would like and now. If he is a little to now he will nap for about 30 minutes duration and feels refreshed. He does not wake up with headaches or discomfort. He  does not recall dreaming during his short naps.  His wife reported no sleep talking, walking. No snoring on CPAP and no apneas witnessed on CPAP.  03-31-14 Michael [name] underwent a polysomnography on 12-16-13 at [place] [facility] [place]. The patient's was diagnosed with obstructive sleep apnea at an AHI of 23.9 and an RDI of 34.2 per hour in addition there were several so-called respiratory event related arousals indicating upper airway resistance he syndrome. The patient also had some periodic limb movements and kicked himself awake or at least out of deep sleep several times at night. The arousal index was very high at 40.5 per hour the PLM arousal index was 9.9. His heart rate was slow normal sinus rhythm. The patient was titrated to 8 cm water pressure which resolved his apnea a ResMed nasal pillow mask a so-called air-fluid P 10 in medium size was used. He regained a normal sleep architecture under CPAP use. The significant decrease in arousals and periodic limb movements as well. There was significant REM rebound noted 1 CPAP was initiated. Today's compliance report shows an AHI of 1.4 the set pressure of 8 cm water was 1 cm EPR. Average user time is 7 hours and 16 minutes the patient alternates between to CPAP of which one is more trouble friendly. He used this machine 22 out of 22 days  with 100% compliance over 4 hours. He used his alternative machines in the week prior with similar download results. His Epworth sleepiness score today is endorsed at 2 points and his fatigue at 26 points. He has cut down on tobacco use, he has not gained weight.  He travelled to [place] on a mission, building a [name].   11-09-14 Michael [name] is here on his first visit since entering Medicare. [facility] his primary medical equipment company asked him to have a follow-up with his sleep physician to gain coverage through the Medicare system. He is here to document again his compliance. A download  was obtained dated 9-20 5-16 which was 100% compliance was 30 out of 30 days all those days was over 4 hours or fumes. Average user time is 9 hours and 41 minutes. The machine is set at 8 cm water was 1 cm EPR and a residual AHI of 1.2, which is close to an ideal result. He does not have significant air leaks on the on rare occasions. His Epworth sleepiness score is endorsed at 3 points fatigue severity at 25 points and his geriatric depression inventory at 0 points.  Interval history from 11/09/2015. Michael [name] has retired and enjoys the last summer very much. Continues to smoke. Continues to use CPAP and states that he no longer can sleep without it. Compliance is 90% 27 out of 30 days over 4 hours of consecutive use was an average of 8 hours and 40 minutes. CPAP is set at 8 cm water pressure was 1 cm EPR and an AHI of 1.0. He uses his second  CPAP at his second home, a [place].    Review of Systems: Out of a complete 14 system review, the patient complains of only the following symptoms, and all other reviewed systems are negative. Daytime naps, fatigue after lunch. No nocturia.His Epworth sleepiness score is endorsed at 3 points fatigue severity at 25 points and his geriatric depression inventory at 0 points.   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 [place], he is Public house manager, likes to fish.    Social History   Social History  . Marital status: Married    Spouse name: N/A  . Number of children: 0  . Years of education: HS   Occupational History  . Not on file.   Social History Main Topics  . Smoking status: Heavy Tobacco Smoker    Packs/day: 0.50    Years: 40.00    Types: Cigarettes    Start date: 04/12/1967    Last attempt to quit: 03/06/2012  . Smokeless tobacco: Current User  . Alcohol use No  . Drug use: No  . Sexual activity: Yes   Other Topics Concern  . Not on file   Social History Narrative    Patient is married, does not have children.   Patient is right handed.   Patient has high school education.   Patient does not drink caffeine.    Family History  Problem Relation Age of Onset  . Cancer  86    PANCREATIC CANCER  . Hypertension    . Cancer Mother     Past Medical History:  Diagnosis Date  . Anal fissure   . Anxiety   . Convulsions ([place])    AS A CHILD  . Diverticulitis   . Heart murmur   . Hiatal hernia with gastroesophageal reflux   . HLD (  hyperlipidemia)   . MVP (mitral valve prolapse)    with moderate MR per echo in 2009  . Normal cardiac stress test January 2014  . OSA on CPAP 11/11/2013  . Sleep apnea    wears CPAP  . Tinnitus   . Tobacco abuse   . Tobacco use disorder 03/31/2014  . White coat hypertension     Past Surgical History:  Procedure Laterality Date  . APPENDECTOMY  2006  . HERNIA REPAIR    . ROTATOR CUFF REPAIR Left 2007    Current Outpatient Prescriptions  Medication Sig Dispense Refill  . ALPRAZolam (XANAX) 0.5 MG tablet Take 0.5 mg by mouth as needed.      Marland Kitchen aspirin 81 MG tablet Take 81 mg by mouth daily.      . B Complex CAPS Take by mouth daily.      . cholecalciferol (VITAMIN D) 1000 UNITS tablet Take 1,000 Units by mouth daily.      Marland Kitchen FIBER COMPLETE PO Take by mouth daily.    Marland Kitchen loratadine (CLARITIN) 10 MG tablet Take 10 mg by mouth as needed.     Marland Kitchen losartan (COZAAR) 50 MG tablet Take 1 tablet (50 mg total) by mouth 2 (two) times daily. 60 tablet 6  . nebivolol (BYSTOLIC) 5 MG tablet Take 1 tablet (5 mg total) by mouth daily. 30 tablet 5  . pantoprazole (PROTONIX) 40 MG tablet Take 40 mg by mouth daily.    . sertraline (ZOLOFT) 50 MG tablet Take 50 mg by mouth daily.    . simvastatin (ZOCOR) 20 MG tablet Take 20 mg by mouth at bedtime.      Marland Kitchen zolpidem (AMBIEN CR) 12.5 MG CR tablet Take 1 tablet (12.5 mg total) by mouth at bedtime as needed for sleep. 30 tablet 0   No current facility-administered medications for this visit.       Allergies as of 11/09/2015  . (No Known Allergies)    Vitals: BP (!) 150/80   Pulse (!) 44   Resp 20   Ht 5\' 11"  (1.803 m)   Wt 222 lb (100.7 kg)   BMI 30.96 kg/m  Last Weight:  Wt Readings from Last 1 Encounters:  11/09/15 222 lb (100.7 kg)       Last Height:   Ht Readings from Last 1 Encounters:  11/09/15 5\' 11"  (1.803 m)    Physical exam:  General: The patient is awake, alert and appears not in acute distress.  The patient is well groomed. Head: Normocephalic, atraumatic. Neck is supple. Mallampati 3 ,  neck circumference: 19.0 inches . Nasal airflow congested. Cardiovascular:  Regular rate and rhythm , without distended neck veins. Respiratory: Lungs are clear to auscultation. Skin:  Without evidence of edema, or rash Trunk: BMI is  elevated and patient  has normal posture.  Neurologic exam : Cranial nerves: Pupils are equal and briskly reactive to light. Hearing to finger rub intact.  Facial sensation intact to fine touch. Facial motor strength is symmetric and tongue and uvula move midline. Assessment:  After physical and neurologic examination, review of laboratory studies, imaging, neurophysiology testing and pre-existing records, assessment is ;   Highly compliant by CMS criteria 90%. The patient was advised of the nature of the diagnosed sleep disorder , the nicotine use and  the treatment options and risks for general a health and wellness arising from not treating the condition.  Visit duration was 25 minutes. More than 10 minutes were face to face discussion and  arranging follow up with DME> no settings were changed.   Plan:  Treatment plan and additional workup : continue CPAP use at 100%. compliance             Given Tobacco use cessation advise, offered referral to cessation classes.    Rv in 65 month     Asencion Partridge Elyse Prevo MD  11/09/2015   [facility] PCP is Dr Joylene Draft.

## 2015-12-26 DIAGNOSIS — R7301 Impaired fasting glucose: Secondary | ICD-10-CM | POA: Diagnosis not present

## 2015-12-26 DIAGNOSIS — I10 Essential (primary) hypertension: Secondary | ICD-10-CM | POA: Diagnosis not present

## 2015-12-26 DIAGNOSIS — Z125 Encounter for screening for malignant neoplasm of prostate: Secondary | ICD-10-CM | POA: Diagnosis not present

## 2015-12-26 DIAGNOSIS — E784 Other hyperlipidemia: Secondary | ICD-10-CM | POA: Diagnosis not present

## 2016-01-02 DIAGNOSIS — I341 Nonrheumatic mitral (valve) prolapse: Secondary | ICD-10-CM | POA: Diagnosis not present

## 2016-01-02 DIAGNOSIS — Z23 Encounter for immunization: Secondary | ICD-10-CM | POA: Diagnosis not present

## 2016-01-02 DIAGNOSIS — Z0000 Encounter for general adult medical examination without abnormal findings: Secondary | ICD-10-CM | POA: Diagnosis not present

## 2016-01-02 DIAGNOSIS — I10 Essential (primary) hypertension: Secondary | ICD-10-CM | POA: Diagnosis not present

## 2016-01-02 DIAGNOSIS — E784 Other hyperlipidemia: Secondary | ICD-10-CM | POA: Diagnosis not present

## 2016-01-02 DIAGNOSIS — M25529 Pain in unspecified elbow: Secondary | ICD-10-CM | POA: Diagnosis not present

## 2016-01-02 DIAGNOSIS — E668 Other obesity: Secondary | ICD-10-CM | POA: Diagnosis not present

## 2016-01-02 DIAGNOSIS — R7301 Impaired fasting glucose: Secondary | ICD-10-CM | POA: Diagnosis not present

## 2016-01-02 DIAGNOSIS — M25562 Pain in left knee: Secondary | ICD-10-CM | POA: Diagnosis not present

## 2016-01-02 DIAGNOSIS — Z6830 Body mass index (BMI) 30.0-30.9, adult: Secondary | ICD-10-CM | POA: Diagnosis not present

## 2016-01-02 DIAGNOSIS — M549 Dorsalgia, unspecified: Secondary | ICD-10-CM | POA: Diagnosis not present

## 2016-01-03 DIAGNOSIS — Z1212 Encounter for screening for malignant neoplasm of rectum: Secondary | ICD-10-CM | POA: Diagnosis not present

## 2016-01-04 ENCOUNTER — Other Ambulatory Visit: Payer: Self-pay | Admitting: Internal Medicine

## 2016-01-04 DIAGNOSIS — Z720 Tobacco use: Secondary | ICD-10-CM

## 2016-01-12 ENCOUNTER — Inpatient Hospital Stay: Admission: RE | Admit: 2016-01-12 | Payer: Self-pay | Source: Ambulatory Visit

## 2016-08-29 DIAGNOSIS — H2513 Age-related nuclear cataract, bilateral: Secondary | ICD-10-CM | POA: Diagnosis not present

## 2016-10-11 ENCOUNTER — Telehealth: Payer: Self-pay | Admitting: Neurology

## 2016-10-11 NOTE — Telephone Encounter (Signed)
Branka([phone]) from [place] calling re: a Medical Necessity form that was faxed back in July for Cpap filters for pt.  She states they never receicved it back.  She states Dr Brett Fairy has done these before for them.  It is needed for billing reasons.  She said that she will refax to our [phone]

## 2016-10-12 NOTE — Telephone Encounter (Signed)
I have resent this patients information and order today.

## 2016-11-13 ENCOUNTER — Ambulatory Visit (INDEPENDENT_AMBULATORY_CARE_PROVIDER_SITE_OTHER): Payer: Medicare Other | Admitting: Neurology

## 2016-11-13 ENCOUNTER — Encounter: Payer: Self-pay | Admitting: Neurology

## 2016-11-13 VITALS — BP 159/94 | HR 47 | Ht 72.0 in | Wt 233.0 lb

## 2016-11-13 DIAGNOSIS — G4733 Obstructive sleep apnea (adult) (pediatric): Secondary | ICD-10-CM | POA: Diagnosis not present

## 2016-11-13 DIAGNOSIS — Z9989 Dependence on other enabling machines and devices: Secondary | ICD-10-CM

## 2016-11-13 NOTE — Patient Instructions (Addendum)
sinusitis patient

## 2016-11-13 NOTE — Progress Notes (Signed)
SLEEP MEDICINE CLINIC   Provider:  Larey Seat, M D  Referring Provider: [name], MD Primary Care Physician:  [name], MD  Chief Complaint  Patient presents with  . Follow-up    pt alone, rm 11, cpap is working well    HPI:  Michael [name] is a 66 y.o. male  , who is seen here as a referral from Dr. Joylene Draft for a sleep evaluation.    CD:Mr. Hinton was first diagnosed with obstructive sleep apnea approximately 20 years ago in [place].  7 years ago he was retested at [facility] sleep, but couldn't sleep. The patient continued to use CPAP but had no supplier for parts. He is compliant and feels that CPAP works well for him, but had no follow up by sleep MD for many years.The patient's study was from 2010 and was performed a year after he was diagnosed with a heart murmur and and abnormal echo 2009.  The patient also has a history of hiatal hernia with acid reflux, convulsions as a child possible febrile seizures, tinnitus, Mnire's disease, diverticulitis, hypertension. The patient is retired and used to work for [employer]. 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    Sleep habits are as follows: The patient will to bed around 10:00, usually watches TV and at that until he 'dozes off" shares the bedroom with his wife. Takes some nights ambien. This may take an hour.  Recently he begun waking up spontaneously at 5 AM and is not sure why. It is not nocturia but wakes him he's not in pain or any discomfort he is just wide-awake. His average hours of sleep at night are 6 hours. He has always been spontaneously waking  and does not rely on an alarm.  He feels refreshed and restored when first rising in the morning. He drinks decaffeinated coffee. He drinks soft drinks with caffeine. Around 1 or 2 PM he feels like he would like and now. If he is a little to now he will nap for about 30 minutes duration and feels refreshed. He does not wake up with  headaches or discomfort. He does not recall dreaming during his short naps.  His wife reported no sleep talking, walking. No snoring on CPAP and no apneas witnessed on CPAP.  03-31-14 Mr. Loera underwent a polysomnography on 12-16-2013 at [facility] [place]. The patient was diagnosed with obstructive sleep apnea at an AHI of 23.9/hr.  and an RDI of 34.2 per hour-  in addition there were several so-called respiratory event related arousals indicating upper airway resistance syndrome.  The patient also had some periodic limb movements and " kicked himself awake"  several times at night. The arousal index was very high at 40.5 per hour the PLM arousal index was 9.9. His heart rate was slow normal sinus rhythm. The patient was titrated to 8 cm water pressure which resolved his apnea a ResMed nasal pillow mask a so-called air-fluid P 10 in medium size was used. He regained a normal sleep architecture under CPAP use. The significant decrease in arousals and periodic limb movements as well. There was significant REM rebound noted 1 CPAP was initiated. Today's compliance report shows an AHI of 1.4 the set pressure of 8 cm water was 1 cm EPR. Average user time is 7 hours and 16 minutes the patient alternates between to CPAP of which one is more trouble friendly. He used this machine 22 out of 22  days with 100% compliance over 4 hours. He used his alternative machines in the week prior with similar download results. His Epworth sleepiness score today is endorsed at 2 points and his fatigue at 26 points. He has cut down on tobacco use, he has not gained weight.  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   11-09-14 Mr. Rutt is here on his first visit since entering Medicare. [facility] his primary medical equipment company asked him to have a follow-up with his sleep physician to gain coverage through the Medicare system. He is here to document again his compliance. A  download was obtained dated 9-20 5-16 which was 100% compliance was 30 out of 30 days all those days was over 4 hours or fumes. Average user time is 9 hours and 41 minutes. The machine is set at 8 cm water was 1 cm EPR and a residual AHI of 1.2, which is close to an ideal result. He does not have significant air leaks on the on rare occasions. His Epworth sleepiness score is endorsed at 3 points fatigue severity at 25 points and his geriatric depression inventory at 0 points.  Interval history from 11/09/2015. Mr. Blanke has retired and enjoys the last summer very much. Continues to smoke. Continues to use CPAP and states that he no longer can sleep without it. Compliance is 90% 27 out of 30 days over 4 hours of consecutive use was an average of 8 hours and 40 minutes. CPAP is set at 8 cm water pressure was 1 cm EPR and an AHI of 1.0. He uses his second  CPAP at his second home, a [place].  11-13-2016,  Mr. Mohar is seen year today for a routine yearly CPAP compliance visit he has been using his CPAP 100% compliant with a residual AHI of 0.4 per hour, CPAP is set at 8 cm water was 17 m EPR. He is [facility]. Average user time is 10 hours, he has moderate air leaks. No change in settings as needed. He does not need any supplies at this time. Epworth sleepiness score is endorsed at 2 points fatigue severity at 17 points, geriatric depression score at 0 out of 15. He no longer uses Ambien !      Review of Systems: Out of a complete 14 system review, the patient complains of only the following symptoms, and all other reviewed systems are negative. Daytime naps, fatigue after lunch. No nocturia. His Epworth sleepiness score is endorsed at 2 points fatigue severity at 17 points and his geriatric depression inventory at 0/ 15  points.   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 [place], he is Public house manager, likes to  fish.    Social History   Social History  . Marital status: Married    Spouse name: N/A  . Number of children: 0  . Years of education: HS   Occupational History  . Not on file.   Social History Main Topics  . Smoking status: Heavy Tobacco Smoker    Packs/day: 0.50    Years: 40.00    Types: Cigarettes    Start date: 04/12/1967    Last attempt to quit: 03/06/2012  . Smokeless tobacco: Current User  . Alcohol use No  . Drug use: No  . Sexual activity: Yes   Other Topics Concern  . Not on file   Social  History Narrative   Patient is married, does not have children.   Patient is right handed.   Patient has high school education.   Patient does not drink caffeine.    Family History  Problem Relation Age of Onset  . Cancer Unknown 86       PANCREATIC CANCER  . Hypertension Unknown   . Cancer Mother     Past Medical History:  Diagnosis Date  . Anal fissure   . Anxiety   . Convulsions ([place])    AS A CHILD  . Diverticulitis   . Heart murmur   . Hiatal hernia with gastroesophageal reflux   . HLD (hyperlipidemia)   . MVP (mitral valve prolapse)    with moderate MR per echo in 2009  . Normal cardiac stress test January 2014  . OSA on CPAP 11/11/2013  . Sleep apnea    wears CPAP  . Tinnitus   . Tobacco abuse   . Tobacco use disorder 03/31/2014  . White coat hypertension     Past Surgical History:  Procedure Laterality Date  . APPENDECTOMY  2006  . HERNIA REPAIR    . ROTATOR CUFF REPAIR Left 2007    Current Outpatient Prescriptions  Medication Sig Dispense Refill  . ALPRAZolam (XANAX) 0.5 MG tablet Take 0.5 mg by mouth as needed.      Marland Kitchen aspirin 81 MG tablet Take 81 mg by mouth daily.      . B Complex CAPS Take by mouth daily.      . cholecalciferol (VITAMIN D) 1000 UNITS tablet Take 1,000 Units by mouth daily.      Marland Kitchen FIBER COMPLETE PO Take by mouth daily.    Marland Kitchen loratadine (CLARITIN) 10 MG tablet Take 10 mg by mouth as needed.     Marland Kitchen losartan (COZAAR) 50 MG  tablet Take 1 tablet (50 mg total) by mouth 2 (two) times daily. 60 tablet 6  . nebivolol (BYSTOLIC) 5 MG tablet Take 1 tablet (5 mg total) by mouth daily. 30 tablet 5  . pantoprazole (PROTONIX) 40 MG tablet Take 40 mg by mouth daily.    . sertraline (ZOLOFT) 50 MG tablet Take 50 mg by mouth daily.    . simvastatin (ZOCOR) 20 MG tablet Take 20 mg by mouth at bedtime.      Marland Kitchen zolpidem (AMBIEN CR) 12.5 MG CR tablet Take 1 tablet (12.5 mg total) by mouth at bedtime as needed for sleep. 30 tablet 0   No current facility-administered medications for this visit.     Allergies as of 11/13/2016  . (No Known Allergies)    Vitals: BP (!) 159/94   Pulse (!) 47   Ht 6' (1.829 m)   Wt 233 lb (105.7 kg)   BMI 31.60 kg/m  Last Weight:  Wt Readings from Last 1 Encounters:  11/13/16 233 lb (105.7 kg)       Last Height:   Ht Readings from Last 1 Encounters:  11/13/16 6' (1.829 m)    Physical exam:  General: The patient is awake, alert and appears not in acute distress.  The patient is well groomed. Head: Normocephalic, atraumatic. Neck is supple. Mallampati 3 ,  neck circumference: 19.0 inches . Nasal airflow congested. Cardiovascular:  Regular rate and rhythm , without distended neck veins. Respiratory: Lungs are clear to auscultation. Skin:  Without evidence of edema, or rash Trunk: BMI is  elevated and patient  has normal posture.  Neurologic exam : Cranial nerves: Pupils are equal and  briskly reactive to light. Hearing to finger rub intact.  Facial sensation intact to fine touch. Facial motor strength is symmetric and tongue and uvula move midline. Assessment:  After physical and neurologic examination, review of laboratory studies, imaging, neurophysiology testing and pre-existing records, assessment is ;   Highly compliant by CMS criteria 100%. Has a second CPAP without WiFi capability at [facility].  Rv in 12  Month with NP.     Asencion Partridge Sawyer Mentzer MD  11/13/2016   Cc PCP is Dr  Joylene Draft.

## 2017-03-29 DIAGNOSIS — Z125 Encounter for screening for malignant neoplasm of prostate: Secondary | ICD-10-CM | POA: Diagnosis not present

## 2017-03-29 DIAGNOSIS — R7301 Impaired fasting glucose: Secondary | ICD-10-CM | POA: Diagnosis not present

## 2017-03-29 DIAGNOSIS — R82998 Other abnormal findings in urine: Secondary | ICD-10-CM | POA: Diagnosis not present

## 2017-03-29 DIAGNOSIS — E7849 Other hyperlipidemia: Secondary | ICD-10-CM | POA: Diagnosis not present

## 2017-03-29 DIAGNOSIS — I10 Essential (primary) hypertension: Secondary | ICD-10-CM | POA: Diagnosis not present

## 2017-04-05 DIAGNOSIS — Z0000 Encounter for general adult medical examination without abnormal findings: Secondary | ICD-10-CM | POA: Diagnosis not present

## 2017-04-05 DIAGNOSIS — G4733 Obstructive sleep apnea (adult) (pediatric): Secondary | ICD-10-CM | POA: Diagnosis not present

## 2017-04-05 DIAGNOSIS — J0100 Acute maxillary sinusitis, unspecified: Secondary | ICD-10-CM | POA: Diagnosis not present

## 2017-04-05 DIAGNOSIS — K219 Gastro-esophageal reflux disease without esophagitis: Secondary | ICD-10-CM | POA: Diagnosis not present

## 2017-04-05 DIAGNOSIS — N183 Chronic kidney disease, stage 3 (moderate): Secondary | ICD-10-CM | POA: Diagnosis not present

## 2017-04-05 DIAGNOSIS — Z1389 Encounter for screening for other disorder: Secondary | ICD-10-CM | POA: Diagnosis not present

## 2017-04-05 DIAGNOSIS — Z23 Encounter for immunization: Secondary | ICD-10-CM | POA: Diagnosis not present

## 2017-04-05 DIAGNOSIS — I10 Essential (primary) hypertension: Secondary | ICD-10-CM | POA: Diagnosis not present

## 2017-04-05 DIAGNOSIS — M5489 Other dorsalgia: Secondary | ICD-10-CM | POA: Diagnosis not present

## 2017-04-05 DIAGNOSIS — R0609 Other forms of dyspnea: Secondary | ICD-10-CM | POA: Diagnosis not present

## 2017-04-05 DIAGNOSIS — J209 Acute bronchitis, unspecified: Secondary | ICD-10-CM | POA: Diagnosis not present

## 2017-04-05 DIAGNOSIS — M25562 Pain in left knee: Secondary | ICD-10-CM | POA: Diagnosis not present

## 2017-04-05 DIAGNOSIS — Z6833 Body mass index (BMI) 33.0-33.9, adult: Secondary | ICD-10-CM | POA: Diagnosis not present

## 2017-04-09 ENCOUNTER — Other Ambulatory Visit: Payer: Self-pay | Admitting: Internal Medicine

## 2017-04-09 DIAGNOSIS — Z720 Tobacco use: Secondary | ICD-10-CM

## 2017-04-11 DIAGNOSIS — Z1212 Encounter for screening for malignant neoplasm of rectum: Secondary | ICD-10-CM | POA: Diagnosis not present

## 2017-04-22 ENCOUNTER — Ambulatory Visit
Admission: RE | Admit: 2017-04-22 | Discharge: 2017-04-22 | Disposition: A | Discharge status: Home / Self Care | Payer: Medicare Other | Source: Ambulatory Visit | Attending: Internal Medicine | Admitting: Internal Medicine

## 2017-04-22 ENCOUNTER — Inpatient Hospital Stay
Admission: RE | Admit: 2017-04-22 | Discharge: 2017-04-22 | Disposition: A | Discharge status: Home / Self Care | Payer: Self-pay | Source: Ambulatory Visit | Attending: Internal Medicine | Admitting: Internal Medicine

## 2017-04-22 DIAGNOSIS — Z720 Tobacco use: Secondary | ICD-10-CM

## 2017-04-22 DIAGNOSIS — F17210 Nicotine dependence, cigarettes, uncomplicated: Secondary | ICD-10-CM | POA: Diagnosis not present

## 2017-08-19 DIAGNOSIS — R109 Unspecified abdominal pain: Secondary | ICD-10-CM | POA: Diagnosis not present

## 2017-08-19 DIAGNOSIS — R1084 Generalized abdominal pain: Secondary | ICD-10-CM | POA: Diagnosis not present

## 2017-08-19 DIAGNOSIS — Z6832 Body mass index (BMI) 32.0-32.9, adult: Secondary | ICD-10-CM | POA: Diagnosis not present

## 2017-08-19 DIAGNOSIS — N200 Calculus of kidney: Secondary | ICD-10-CM | POA: Diagnosis not present

## 2017-08-19 DIAGNOSIS — R309 Painful micturition, unspecified: Secondary | ICD-10-CM | POA: Diagnosis not present

## 2017-08-19 DIAGNOSIS — I10 Essential (primary) hypertension: Secondary | ICD-10-CM | POA: Diagnosis not present

## 2017-09-23 DIAGNOSIS — N200 Calculus of kidney: Secondary | ICD-10-CM | POA: Diagnosis not present

## 2017-09-23 DIAGNOSIS — R3129 Other microscopic hematuria: Secondary | ICD-10-CM | POA: Diagnosis not present

## 2017-11-11 ENCOUNTER — Encounter: Payer: Self-pay | Admitting: Adult Health

## 2017-11-13 ENCOUNTER — Encounter: Payer: Self-pay | Admitting: Adult Health

## 2017-11-13 ENCOUNTER — Ambulatory Visit (INDEPENDENT_AMBULATORY_CARE_PROVIDER_SITE_OTHER): Payer: Medicare Other | Admitting: Adult Health

## 2017-11-13 VITALS — BP 130/74 | HR 56 | Ht 72.0 in | Wt 232.2 lb

## 2017-11-13 DIAGNOSIS — Z9989 Dependence on other enabling machines and devices: Secondary | ICD-10-CM

## 2017-11-13 DIAGNOSIS — G4733 Obstructive sleep apnea (adult) (pediatric): Secondary | ICD-10-CM

## 2017-11-13 NOTE — Patient Instructions (Signed)
Your Plan:  Continue using CPAP nightly and >4 hours If your symptoms worsen or you develop new symptoms please let us know.   Thank you for coming to see us at [facility]. I hope we have been able to provide you high quality care today.  You may receive a patient satisfaction survey over the next few weeks. We would appreciate your feedback and comments so that we may continue to improve ourselves and the health of our patients.  

## 2017-11-13 NOTE — Progress Notes (Signed)
PATIENT: Michael [name] DOB: [birth year]  REASON FOR VISIT: follow up HISTORY FROM: patient  HISTORY OF PRESENT ILLNESS: Today 11/13/17:  Michael [name] is a 67 year old male with a history of obstructive sleep apnea on CPAP.  He returns today for follow-up.  His CPAP download indicates that he uses machine 30 out of 30 days for compliance of 100%.  He uses machine greater than 4 hours each night.  On average he uses his machine 9 hours and 47 minutes.  His residual AHI is 0.5 on 8 cm of water with EPR of 1.  He states that the machine continues to work well for him.  His Epworth sleepiness score is 2.  He returns today for evaluation.  HISTORY 11-13-2016,  Michael [name] is seen year today for a routine yearly CPAP compliance visit he has been using his CPAP 100% compliant with a residual AHI of 0.4 per hour, CPAP is set at 8 cm water was 17 m EPR. [facility]. Average user time is 10 hours, he has moderate air leaks. No change in settings as needed. He does not need any supplies at this time. Epworth sleepiness score is endorsed at 2 points fatigue severity at 17 points, geriatric depression score at 0 out of 15.  REVIEW OF SYSTEMS: Out of a complete 14 system review of symptoms, the patient complains only of the following symptoms, and all other reviewed systems are negative.  See HPI  ALLERGIES: No Known Allergies  HOME MEDICATIONS: Outpatient Medications Prior to Visit  Medication Sig Dispense Refill  . ALPRAZolam (XANAX) 0.5 MG tablet Take 0.5 mg by mouth as needed.      Marland Kitchen aspirin 81 MG tablet Take 81 mg by mouth daily.      . B Complex CAPS Take by mouth daily.      . cholecalciferol (VITAMIN D) 1000 UNITS tablet Take 1,000 Units by mouth daily.      Marland Kitchen FIBER COMPLETE PO Take by mouth daily.    Marland Kitchen loratadine (CLARITIN) 10 MG tablet Take 10 mg by mouth as needed.     Marland Kitchen losartan (COZAAR) 50 MG tablet Take 1 tablet (50 mg total) by mouth 2 (two) times daily. 60  tablet 6  . nebivolol (BYSTOLIC) 5 MG tablet Take 1 tablet (5 mg total) by mouth daily. 30 tablet 5  . pantoprazole (PROTONIX) 40 MG tablet Take 40 mg by mouth daily.    . sertraline (ZOLOFT) 50 MG tablet Take 50 mg by mouth daily.    . simvastatin (ZOCOR) 20 MG tablet Take 20 mg by mouth at bedtime.      Marland Kitchen zolpidem (AMBIEN CR) 12.5 MG CR tablet Take 1 tablet (12.5 mg total) by mouth at bedtime as needed for sleep. 30 tablet 0   No facility-administered medications prior to visit.     PAST MEDICAL HISTORY: Past Medical History:  Diagnosis Date  . Anal fissure   . Anxiety   . Convulsions ([place])    AS A CHILD  . Diverticulitis   . Heart murmur   . Hiatal hernia with gastroesophageal reflux   . HLD (hyperlipidemia)   . MVP (mitral valve prolapse)    with moderate MR per echo in 2009  . Normal cardiac stress test January 2014  . OSA on CPAP 11/11/2013  . Sleep apnea    wears CPAP  . Tinnitus   . Tobacco abuse   . Tobacco use disorder 03/31/2014  .  White coat hypertension     PAST SURGICAL HISTORY: Past Surgical History:  Procedure Laterality Date  . APPENDECTOMY  2006  . HERNIA REPAIR    . ROTATOR CUFF REPAIR Left 2007    FAMILY HISTORY: Family History  Problem Relation Age of Onset  . Cancer Mother   . Cancer Unknown 86       PANCREATIC CANCER  . Hypertension Unknown   . Sleep apnea Brother        CPAP    SOCIAL HISTORY: Social History   Socioeconomic History  . Marital status: Married    Spouse name: Not on file  . Number of children: 0  . Years of education: HS  . Highest education level: Not on file  Occupational History  . Not on file  Social Needs  . Financial resource strain: Not on file  . Food insecurity:    Worry: Not on file    Inability: Not on file  . Transportation needs:    Medical: Not on file    Non-medical: Not on file  Tobacco Use  . Smoking status: Heavy Tobacco Smoker    Packs/day: 0.50    Years: 40.00    Pack years: 20.00     Types: Cigarettes    Start date: 04/12/1967    Last attempt to quit: 03/06/2012    Years since quitting: 5.6  . Smokeless tobacco: Current User  Substance and Sexual Activity  . Alcohol use: No    Alcohol/week: 0.0 standard drinks  . Drug use: No  . Sexual activity: Yes  Lifestyle  . Physical activity:    Days per week: Not on file    Minutes per session: Not on file  . Stress: Not on file  Relationships  . Social connections:    Talks on phone: Not on file    Gets together: Not on file    Attends religious service: Not on file    Active member of club or organization: Not on file    Attends meetings of clubs or organizations: Not on file    Relationship status: Not on file  . Intimate partner violence:    Fear of current or ex partner: Not on file    Emotionally abused: Not on file    Physically abused: Not on file    Forced sexual activity: Not on file  Other Topics Concern  . Not on file  Social History Narrative   Patient is married, does not have children.   Patient is right handed.   Patient has high school education.   Patient does not drink caffeine.      PHYSICAL EXAM  Vitals:   11/13/17 0735  BP: 130/74  Pulse: (!) 56  Weight: 232 lb 3.2 oz (105.3 kg)  Height: 6' (1.829 m)   Body mass index is 31.49 kg/m.  Generalized: Well developed, in no acute distress   Neurological examination  Mentation: Alert oriented to time, place, history taking. Follows all commands speech and language fluent Cranial nerve II-XII: Extraocular movements were full, visual field were full on confrontational test. Facial sensation and strength were normal. Uvula tongue midline. Head turning and shoulder shrug  were normal and symmetric.  Neck circumference 19 inches.,  Mallampati 4+ Motor: The motor testing reveals 5 over 5 strength of all 4 extremities. Good symmetric motor tone is noted throughout.  Sensory: Sensory testing is intact to soft touch on all 4 extremities. No  evidence of extinction is noted.  Coordination: Cerebellar  testing reveals good finger-nose-finger and heel-to-shin bilaterally.  Gait and station: Gait is normal.  Reflexes: Deep tendon reflexes are symmetric and normal bilaterally.   DIAGNOSTIC DATA (LABS, IMAGING, TESTING) - I reviewed patient records, labs, notes, testing and imaging myself where available.     ASSESSMENT AND PLAN 67 y.o. year old male  has a past medical history of Anal fissure, Anxiety, Convulsions ([place]), Diverticulitis, Heart murmur, Hiatal hernia with gastroesophageal reflux, HLD (hyperlipidemia), MVP (mitral valve prolapse), Normal cardiac stress test (January 2014), OSA on CPAP (11/11/2013), Sleep apnea, Tinnitus, Tobacco abuse, Tobacco use disorder (03/31/2014), and White coat hypertension. here with:  1.  Obstructive sleep apnea on CPAP  The patient CPAP download shows excellent compliance and good treatment of her apnea.  He is encouraged to continue using the CPAP nightly and greater than 4 hours each night.  He is advised that if her symptoms worsen or she develops new symptoms she should let us know.  He will follow-up in 1 year or sooner if needed.   I spent 15 minutes with the patient. 50% of this time was spent reviewing CPAP download  Ward Givens, MSN, NP-C 11/13/2017, 8:04 AM [facility] [address] [place], [place] [address] [phone]

## 2017-12-04 DIAGNOSIS — Z23 Encounter for immunization: Secondary | ICD-10-CM | POA: Diagnosis not present

## 2018-01-16 DIAGNOSIS — H5203 Hypermetropia, bilateral: Secondary | ICD-10-CM | POA: Diagnosis not present

## 2018-01-16 DIAGNOSIS — H2513 Age-related nuclear cataract, bilateral: Secondary | ICD-10-CM | POA: Diagnosis not present

## 2018-04-28 DIAGNOSIS — K635 Polyp of colon: Secondary | ICD-10-CM | POA: Diagnosis not present

## 2018-04-28 DIAGNOSIS — Z86010 Personal history of colonic polyps: Secondary | ICD-10-CM | POA: Diagnosis not present

## 2018-04-28 DIAGNOSIS — K5730 Diverticulosis of large intestine without perforation or abscess without bleeding: Secondary | ICD-10-CM | POA: Diagnosis not present

## 2018-04-28 DIAGNOSIS — D125 Benign neoplasm of sigmoid colon: Secondary | ICD-10-CM | POA: Diagnosis not present

## 2018-04-28 DIAGNOSIS — D123 Benign neoplasm of transverse colon: Secondary | ICD-10-CM | POA: Diagnosis not present

## 2018-04-30 DIAGNOSIS — D125 Benign neoplasm of sigmoid colon: Secondary | ICD-10-CM | POA: Diagnosis not present

## 2018-04-30 DIAGNOSIS — K635 Polyp of colon: Secondary | ICD-10-CM | POA: Diagnosis not present

## 2018-04-30 DIAGNOSIS — E7849 Other hyperlipidemia: Secondary | ICD-10-CM | POA: Diagnosis not present

## 2018-04-30 DIAGNOSIS — R7301 Impaired fasting glucose: Secondary | ICD-10-CM | POA: Diagnosis not present

## 2018-04-30 DIAGNOSIS — Z125 Encounter for screening for malignant neoplasm of prostate: Secondary | ICD-10-CM | POA: Diagnosis not present

## 2018-04-30 DIAGNOSIS — I10 Essential (primary) hypertension: Secondary | ICD-10-CM | POA: Diagnosis not present

## 2018-04-30 DIAGNOSIS — D123 Benign neoplasm of transverse colon: Secondary | ICD-10-CM | POA: Diagnosis not present

## 2018-05-05 DIAGNOSIS — N183 Chronic kidney disease, stage 3 (moderate): Secondary | ICD-10-CM | POA: Diagnosis not present

## 2018-05-05 DIAGNOSIS — M25522 Pain in left elbow: Secondary | ICD-10-CM | POA: Diagnosis not present

## 2018-05-05 DIAGNOSIS — M5489 Other dorsalgia: Secondary | ICD-10-CM | POA: Diagnosis not present

## 2018-05-05 DIAGNOSIS — R82998 Other abnormal findings in urine: Secondary | ICD-10-CM | POA: Diagnosis not present

## 2018-05-05 DIAGNOSIS — R918 Other nonspecific abnormal finding of lung field: Secondary | ICD-10-CM | POA: Diagnosis not present

## 2018-05-05 DIAGNOSIS — K219 Gastro-esophageal reflux disease without esophagitis: Secondary | ICD-10-CM | POA: Diagnosis not present

## 2018-05-05 DIAGNOSIS — Z1331 Encounter for screening for depression: Secondary | ICD-10-CM | POA: Diagnosis not present

## 2018-05-05 DIAGNOSIS — Z0000 Encounter for general adult medical examination without abnormal findings: Secondary | ICD-10-CM | POA: Diagnosis not present

## 2018-05-05 DIAGNOSIS — I10 Essential (primary) hypertension: Secondary | ICD-10-CM | POA: Diagnosis not present

## 2018-05-05 DIAGNOSIS — N200 Calculus of kidney: Secondary | ICD-10-CM | POA: Diagnosis not present

## 2018-05-05 DIAGNOSIS — G4733 Obstructive sleep apnea (adult) (pediatric): Secondary | ICD-10-CM | POA: Diagnosis not present

## 2018-05-05 DIAGNOSIS — I2510 Atherosclerotic heart disease of native coronary artery without angina pectoris: Secondary | ICD-10-CM | POA: Diagnosis not present

## 2018-05-05 DIAGNOSIS — M25521 Pain in right elbow: Secondary | ICD-10-CM | POA: Diagnosis not present

## 2018-05-06 DIAGNOSIS — Z1212 Encounter for screening for malignant neoplasm of rectum: Secondary | ICD-10-CM | POA: Diagnosis not present

## 2018-05-12 ENCOUNTER — Other Ambulatory Visit: Payer: Self-pay | Admitting: Internal Medicine

## 2018-05-12 DIAGNOSIS — F17200 Nicotine dependence, unspecified, uncomplicated: Secondary | ICD-10-CM

## 2018-06-30 ENCOUNTER — Ambulatory Visit: Payer: Medicare Other

## 2018-07-02 ENCOUNTER — Telehealth: Payer: Self-pay | Admitting: *Deleted

## 2018-07-02 NOTE — Telephone Encounter (Signed)
Received fax from [employer].  Pt was called by [employer] [facility] asking if needed supplies.  He stated if insurance ok'd yes.  He did not ask for new machine or supplies himself.  He states he see;[place] yearly for compliance.  He does not necessarily need supplies at this time, only if insurance approved.

## 2018-07-03 NOTE — Telephone Encounter (Signed)
Received: Today  Message Contents  Sheran Lawless, Kristine Garbe, RN        Good morning!   I was able to find out that this patient transitioned to Medicare on 02/12/18. In order to bill [facility] for his supplies he will need a face to face visit that occurs after his MCR active date of 02/12/18. This visit note will need to document PAP usage and compliance.   Does this help answer the question? Does he need supplies now or no?   Thanks for reaching out!  Melissa   Previous Messages    ----- Message -----  From: Brandon Melnick, RN  Sent: 07/02/2018 11:48 AM EDT  To: Kristine Royal  Subject: cpap                       Hi   Got an fax from you nedding request fro MD evaluation for new machine and /or supplies for the is pt Virgilio Frees.  I called pt and he stated that he received call from you all asking about needing supplies. He said he gets when insurance says he can. He did not call and ask for them specifically. We see him yrly for compliance. This will be in 11/2018. Does he need appt for supplies only? Please advise.  Thanks,, Lovey Newcomer RN at [employer].

## 2018-07-03 NOTE — Telephone Encounter (Signed)
I called pt and relayed what [employer] [facility], Melissa stated.  Pt said he has been on medicare since he turned 82. He did not need supplies right now, he can wait until appt in oct 2020 that is already scheduled.  He will call them and ask about medicare.

## 2018-07-23 ENCOUNTER — Ambulatory Visit
Admission: RE | Admit: 2018-07-23 | Discharge: 2018-07-23 | Disposition: A | Discharge status: Home / Self Care | Payer: Medicare Other | Source: Ambulatory Visit | Attending: Internal Medicine | Admitting: Internal Medicine

## 2018-07-23 ENCOUNTER — Other Ambulatory Visit: Payer: Self-pay

## 2018-07-23 DIAGNOSIS — F17200 Nicotine dependence, unspecified, uncomplicated: Secondary | ICD-10-CM

## 2018-07-23 DIAGNOSIS — F17210 Nicotine dependence, cigarettes, uncomplicated: Secondary | ICD-10-CM | POA: Diagnosis not present

## 2018-07-29 ENCOUNTER — Other Ambulatory Visit: Payer: Self-pay | Admitting: Internal Medicine

## 2018-07-29 DIAGNOSIS — R935 Abnormal findings on diagnostic imaging of other abdominal regions, including retroperitoneum: Secondary | ICD-10-CM

## 2018-09-29 DIAGNOSIS — N390 Urinary tract infection, site not specified: Secondary | ICD-10-CM | POA: Diagnosis not present

## 2018-09-29 DIAGNOSIS — R300 Dysuria: Secondary | ICD-10-CM | POA: Diagnosis not present

## 2018-10-01 ENCOUNTER — Other Ambulatory Visit: Payer: Self-pay

## 2018-10-01 ENCOUNTER — Emergency Department (HOSPITAL_COMMUNITY)
Admission: EM | Admit: 2018-10-01 | Discharge: 2018-10-01 | Disposition: A | Discharge status: Home / Self Care | Payer: Medicare Other | Attending: Emergency Medicine | Admitting: Emergency Medicine

## 2018-10-01 ENCOUNTER — Encounter (HOSPITAL_COMMUNITY): Payer: Self-pay

## 2018-10-01 DIAGNOSIS — I10 Essential (primary) hypertension: Secondary | ICD-10-CM | POA: Diagnosis not present

## 2018-10-01 DIAGNOSIS — Z7982 Long term (current) use of aspirin: Secondary | ICD-10-CM | POA: Diagnosis not present

## 2018-10-01 DIAGNOSIS — N3000 Acute cystitis without hematuria: Secondary | ICD-10-CM | POA: Diagnosis not present

## 2018-10-01 DIAGNOSIS — F17210 Nicotine dependence, cigarettes, uncomplicated: Secondary | ICD-10-CM | POA: Insufficient documentation

## 2018-10-01 DIAGNOSIS — Z79899 Other long term (current) drug therapy: Secondary | ICD-10-CM | POA: Diagnosis not present

## 2018-10-01 DIAGNOSIS — R300 Dysuria: Secondary | ICD-10-CM | POA: Diagnosis present

## 2018-10-01 LAB — URINALYSIS, ROUTINE W REFLEX MICROSCOPIC
Bilirubin Urine: NEGATIVE
Glucose, UA: NEGATIVE mg/dL
Hgb urine dipstick: NEGATIVE
Ketones, ur: NEGATIVE mg/dL
Leukocytes,Ua: NEGATIVE
Nitrite: POSITIVE — AB
Protein, ur: 100 mg/dL — AB
Specific Gravity, Urine: 1.027 (ref 1.005–1.030)
pH: 5 (ref 5.0–8.0)

## 2018-10-01 LAB — BASIC METABOLIC PANEL
Anion gap: 10 (ref 5–15)
BUN: 17 mg/dL (ref 8–23)
CO2: 25 mmol/L (ref 22–32)
Calcium: 8.9 mg/dL (ref 8.9–10.3)
Chloride: 100 mmol/L (ref 98–111)
Creatinine, Ser: 1.47 mg/dL — ABNORMAL HIGH (ref 0.61–1.24)
GFR calc Af Amer: 56 mL/min — ABNORMAL LOW (ref >60)
GFR calc non Af Amer: 48 mL/min — ABNORMAL LOW (ref >60)
Glucose, Bld: 102 mg/dL — ABNORMAL HIGH (ref 70–99)
Potassium: 4 mmol/L (ref 3.5–5.1)
Sodium: 135 mmol/L (ref 135–145)

## 2018-10-01 LAB — CBC WITH DIFFERENTIAL/PLATELET
Abs Immature Granulocytes: 0.11 10*3/uL — ABNORMAL HIGH (ref 0.00–0.07)
Basophils Absolute: 0 10*3/uL (ref 0.0–0.1)
Basophils Relative: 0 %
Eosinophils Absolute: 0 10*3/uL (ref 0.0–0.5)
Eosinophils Relative: 0 %
HCT: 48.4 % (ref 39.0–52.0)
Hemoglobin: 16.3 g/dL (ref 13.0–17.0)
Immature Granulocytes: 1 %
Lymphocytes Relative: 6 %
Lymphs Abs: 0.8 10*3/uL (ref 0.7–4.0)
MCH: 32.5 pg (ref 26.0–34.0)
MCHC: 33.7 g/dL (ref 30.0–36.0)
MCV: 96.4 fL (ref 80.0–100.0)
Monocytes Absolute: 0.8 10*3/uL (ref 0.1–1.0)
Monocytes Relative: 6 %
Neutro Abs: 10.7 10*3/uL — ABNORMAL HIGH (ref 1.7–7.7)
Neutrophils Relative %: 87 %
Platelets: 200 10*3/uL (ref 150–400)
RBC: 5.02 MIL/uL (ref 4.22–5.81)
RDW: 13.3 % (ref 11.5–15.5)
WBC: 12.4 10*3/uL — ABNORMAL HIGH (ref 4.0–10.5)
nRBC: 0 % (ref 0.0–0.2)

## 2018-10-01 MED ORDER — SODIUM CHLORIDE 0.9 % IV BOLUS
500.0000 mL | Freq: Once | INTRAVENOUS | Status: AC
  Administered 2018-10-01: 17:00:00 500 mL via INTRAVENOUS

## 2018-10-01 NOTE — ED Provider Notes (Signed)
[facility] DEPT Provider Note   CSN: [id number] Arrival date & time: 10/01/18  1444     History   Chief Complaint Chief Complaint  Patient presents with  . Dysuria    HPI Michael [name] is a 68 y.o. male.     68 year old male with prior medical history as detailed below presents for evaluation of likely UTI.  Patient reports onset of fever, rigors, and chills 4 days prior.  3 days ago patient was seen in the office and started on Cipro for a reported UTI.  Patient reports that he is feeling much better today.  However he has had this persistent shaking episodes.  He has reported a temperature as high as 99.  He was sent from his primary's office for evaluation of possible dehydration.  He has been taking Cipro for 3 days.  He denies concurrent cough, chest pain, shortness of breath, or other acute complaint.  The history is provided by the patient and medical records.  Dysuria Presenting symptoms: dysuria   Worsened by:  Nothing Associated symptoms: fever     Past Medical History:  Diagnosis Date  . Anal fissure   . Anxiety   . Convulsions ([place])    AS A CHILD  . Diverticulitis   . Heart murmur   . Hiatal hernia with gastroesophageal reflux   . HLD (hyperlipidemia)   . MVP (mitral valve prolapse)    with moderate MR per echo in 2009  . Normal cardiac stress test January 2014  . OSA on CPAP 11/11/2013  . Sleep apnea    wears CPAP  . Tinnitus   . Tobacco abuse   . Tobacco use disorder 03/31/2014  . White coat hypertension     Patient Active Problem List   Diagnosis Date Noted  . Nicotine use disorder 11/09/2015  . HTN, goal below 130/80 11/09/2015  . Tobacco use disorder 03/31/2014  . OSA on CPAP 11/11/2013  . Hypertension 06/06/2010  . MVP (mitral valve prolapse)   . HLD (hyperlipidemia)   . Sleep apnea     Past Surgical History:  Procedure Laterality Date  . APPENDECTOMY  2006  . HERNIA REPAIR    . ROTATOR CUFF REPAIR  Left 2007        Home Medications    Prior to Admission medications   Medication Sig Start Date End Date Taking? Authorizing Provider  ALPRAZolam Duanne Moron) 0.5 MG tablet Take 0.5 mg by mouth as needed.      [provider]  aspirin 81 MG tablet Take 81 mg by mouth daily.      [provider]  B Complex CAPS Take by mouth daily.      [provider]  cholecalciferol (VITAMIN D) 1000 UNITS tablet Take 1,000 Units by mouth daily.      [provider]  FIBER COMPLETE PO Take by mouth daily.    [provider]  loratadine (CLARITIN) 10 MG tablet Take 10 mg by mouth as needed.     [provider]  losartan (COZAAR) 50 MG tablet Take 1 tablet (50 mg total) by mouth 2 (two) times daily. 02/22/12   Burtis Junes, NP  nebivolol (BYSTOLIC) 5 MG tablet Take 1 tablet (5 mg total) by mouth daily. 03/13/12   Nahser, Wonda Cheng, MD  pantoprazole (PROTONIX) 40 MG tablet Take 40 mg by mouth daily.    [provider]  sertraline (ZOLOFT) 50 MG tablet Take 50 mg by mouth daily.  [provider]  simvastatin (ZOCOR) 20 MG tablet Take 20 mg by mouth at bedtime.      [provider]  zolpidem (AMBIEN CR) 12.5 MG CR tablet Take 1 tablet (12.5 mg total) by mouth at bedtime as needed for sleep. 11/11/13   Dohmeier, Asencion Partridge, MD    Family History Family History  Problem Relation Age of Onset  . Cancer Mother   . Cancer Other 86       PANCREATIC CANCER  . Hypertension Other   . Sleep apnea Brother        CPAP    Social History Social History   Tobacco Use  . Smoking status: Heavy Tobacco Smoker    Packs/day: 0.50    Years: 40.00    Pack years: 20.00    Types: Cigarettes    Start date: 04/12/1967    Last attempt to quit: 03/06/2012    Years since quitting: 6.5  . Smokeless tobacco: Never Used  Substance Use Topics  . Alcohol use: No    Alcohol/week: 0.0 standard drinks  . Drug use: No     Allergies   Patient has no  known allergies.   Review of Systems Review of Systems  Constitutional: Positive for fever.  Genitourinary: Positive for dysuria.  All other systems reviewed and are negative.    Physical Exam Updated Vital Signs BP (!) 146/75 (BP Location: Left Arm)   Pulse 60   Temp 99.7 F (37.6 C) (Oral)   Resp 17   Ht 5\' 11"  (1.803 m)   Wt 95.3 kg   SpO2 96%   BMI 29.29 kg/m   Physical Exam Vitals signs and nursing note reviewed.  Constitutional:      General: He is not in acute distress.    Appearance: Normal appearance. He is well-developed.  HENT:     Head: Normocephalic and atraumatic.  Eyes:     Conjunctiva/sclera: Conjunctivae normal.     Pupils: Pupils are equal, round, and reactive to light.  Neck:     Musculoskeletal: Normal range of motion and neck supple.  Cardiovascular:     Rate and Rhythm: Normal rate and regular rhythm.     Heart sounds: Normal heart sounds.  Pulmonary:     Effort: Pulmonary effort is normal. No respiratory distress.     Breath sounds: Normal breath sounds.  Abdominal:     General: There is no distension.     Palpations: Abdomen is soft.     Tenderness: There is no abdominal tenderness.  Musculoskeletal: Normal range of motion.        General: No deformity.  Skin:    General: Skin is warm and dry.  Neurological:     Mental Status: He is alert and oriented to person, place, and time.      ED Treatments / Results  Labs (all labs ordered are listed, but only abnormal results are displayed) Labs Reviewed  URINALYSIS, ROUTINE W REFLEX MICROSCOPIC  BASIC METABOLIC PANEL  CBC WITH DIFFERENTIAL/PLATELET    EKG None  Radiology No results found.  Procedures Procedures (including critical care time)  Medications Ordered in ED Medications  sodium chloride 0.9 % bolus 500 mL (500 mLs Intravenous New Bag/Given (Non-Interop) 10/01/18 1636)     Initial Impression / Assessment and Plan / ED Course  I have reviewed the triage vital  signs and the nursing notes.  Pertinent labs & imaging results that were available during my care of the patient were reviewed by me  and considered in my medical decision making (see chart for details).        MDM  Screen complete  Michael [name] was evaluated in Emergency Department on 10/01/2018 for the symptoms described in the history of present illness. He was evaluated in the context of the global COVID-19 pandemic, which necessitated consideration that the patient might be at risk for infection with the SARS-CoV-2 virus that causes COVID-19. Institutional protocols and algorithms that pertain to the evaluation of patients at risk for COVID-19 are in a state of rapid change based on information released by regulatory bodies including the CDC and federal and state organizations. These policies and algorithms were followed during the patient's care in the ED.   Patient is presenting with complaints of likely UTI.  He has been on Cipro for 3 days now.  His UA performed on Monday did suggest a UTI.  His symptoms have improved significantly over the last 48 hours.  However, his care provider sent him to the ED for repeat evaluation.  Screening labs are without significant abnormality.  Patient does feel significantly improved following his ED evaluation and work-up.  He desires discharge home.  He does understand the need to finish his course of Cipro.  Strict return precautions given and understood.     Final Clinical Impressions(s) / ED Diagnoses   Final diagnoses:  Acute cystitis without hematuria    ED Discharge Orders    None       Valarie Merino, MD 10/01/18 [phone]

## 2018-10-01 NOTE — ED Triage Notes (Signed)
Patient states he went to his PCP 2 days ago and was given Cipro and Pyridium for UTI. Patient states he has one pill left and is still having symptoms along with chills.

## 2018-10-01 NOTE — Discharge Instructions (Addendum)
Please return for any problem. Follow up with your regular care provider as instructed.  Continue Cipro as previously prescribed.

## 2018-10-09 DIAGNOSIS — N503 Cyst of epididymis: Secondary | ICD-10-CM | POA: Diagnosis not present

## 2018-10-09 DIAGNOSIS — N3000 Acute cystitis without hematuria: Secondary | ICD-10-CM | POA: Diagnosis not present

## 2018-10-13 DIAGNOSIS — N3000 Acute cystitis without hematuria: Secondary | ICD-10-CM | POA: Diagnosis not present

## 2018-10-30 DIAGNOSIS — N200 Calculus of kidney: Secondary | ICD-10-CM | POA: Diagnosis not present

## 2018-11-11 DIAGNOSIS — N200 Calculus of kidney: Secondary | ICD-10-CM | POA: Diagnosis not present

## 2018-11-11 DIAGNOSIS — N281 Cyst of kidney, acquired: Secondary | ICD-10-CM | POA: Diagnosis not present

## 2018-11-11 DIAGNOSIS — N3020 Other chronic cystitis without hematuria: Secondary | ICD-10-CM | POA: Diagnosis not present

## 2018-11-19 ENCOUNTER — Encounter: Payer: Self-pay | Admitting: Adult Health

## 2018-11-19 ENCOUNTER — Ambulatory Visit (INDEPENDENT_AMBULATORY_CARE_PROVIDER_SITE_OTHER): Payer: Medicare Other | Admitting: Adult Health

## 2018-11-19 ENCOUNTER — Other Ambulatory Visit: Payer: Self-pay

## 2018-11-19 VITALS — BP 144/79 | HR 43 | Temp 97.5°F | Ht 71.0 in | Wt 226.2 lb

## 2018-11-19 DIAGNOSIS — G4733 Obstructive sleep apnea (adult) (pediatric): Secondary | ICD-10-CM | POA: Diagnosis not present

## 2018-11-19 DIAGNOSIS — Z9989 Dependence on other enabling machines and devices: Secondary | ICD-10-CM | POA: Diagnosis not present

## 2018-11-19 NOTE — Progress Notes (Signed)
PATIENT: Michael [name] DOB: 02-Oct-1950  REASON FOR VISIT: follow up HISTORY FROM: patient  HISTORY OF PRESENT ILLNESS: Today 11/19/18:  Michael [name] is a 68 year old male with a history of obstructive sleep apnea on CPAP.  His download indicates that he uses machine 89 9090 days for compliance of 99%.  He uses machine greater than 4 hours each night.  On average he uses his machine 10 hours and 36 minutes.  His residual AHI is 0.3 on 8 cm of water with EPR of 1.  He does not have a significant leak.  He reports that the CPAP continues to work well for him.  He denies any new issues.  He would like to switch to a new DME company.  He returns today for an evaluation.  HISTORY 11/13/17:  Michael [name] is a 68 year old male with a history of obstructive sleep apnea on CPAP.  He returns today for follow-up.  His CPAP download indicates that he uses machine 30 out of 30 days for compliance of 100%.  He uses machine greater than 4 hours each night.  On average he uses his machine 9 hours and 47 minutes.  His residual AHI is 0.5 on 8 cm of water with EPR of 1.  He states that the machine continues to work well for him.  His Epworth sleepiness score is 2.  He returns today for evaluation.   REVIEW OF SYSTEMS: Out of a complete 14 system review of symptoms, the patient complains only of the following symptoms, and all other reviewed systems are negative.  Epworth sleepiness score 3 fatigue severity score 16  ALLERGIES: No Known Allergies  HOME MEDICATIONS: Outpatient Medications Prior to Visit  Medication Sig Dispense Refill  . aspirin 81 MG tablet Take 81 mg by mouth daily.      . B Complex CAPS Take 1 capsule by mouth daily.     . cholecalciferol (VITAMIN D) 1000 UNITS tablet Take 1,000 Units by mouth daily.      Marland Kitchen FIBER COMPLETE PO Take 1 tablet by mouth daily.     Marland Kitchen loratadine (CLARITIN) 10 MG tablet Take 10 mg by mouth as needed for allergies.     Marland Kitchen losartan (COZAAR) 25 MG tablet Take  50 mg by mouth at bedtime.     . nebivolol (BYSTOLIC) 5 MG tablet Take 1 tablet (5 mg total) by mouth daily. (Patient taking differently: Take 5 mg by mouth at bedtime. ) 30 tablet 5  . pantoprazole (PROTONIX) 40 MG tablet Take 40 mg by mouth daily.    . sertraline (ZOLOFT) 50 MG tablet Take 50 mg by mouth daily.    . simvastatin (ZOCOR) 20 MG tablet Take 20 mg by mouth at bedtime.      Marland Kitchen acetaminophen (TYLENOL) 500 MG tablet Take 1,000 mg by mouth every 6 (six) hours as needed for fever.    . ALPRAZolam (XANAX) 0.5 MG tablet Take 0.5 mg by mouth as needed for anxiety or sleep.     . ciprofloxacin (CIPRO) 500 MG tablet Take 500 mg by mouth 2 (two) times daily.    Marland Kitchen zolpidem (AMBIEN CR) 12.5 MG CR tablet Take 1 tablet (12.5 mg total) by mouth at bedtime as needed for sleep. (Patient not taking: Reported on 10/01/2018) 30 tablet 0  . zolpidem (AMBIEN) 10 MG tablet Take 10 mg by mouth at bedtime as needed for sleep.     No facility-administered medications prior to visit.     PAST  MEDICAL HISTORY: Past Medical History:  Diagnosis Date  . Anal fissure   . Anxiety   . Convulsions ([place])    AS A CHILD  . Diverticulitis   . Heart murmur   . Hiatal hernia with gastroesophageal reflux   . HLD (hyperlipidemia)   . MVP (mitral valve prolapse)    with moderate MR per echo in 2009  . Normal cardiac stress test January 2014  . OSA on CPAP 11/11/2013  . Sleep apnea    wears CPAP  . Tinnitus   . Tobacco abuse   . Tobacco use disorder 03/31/2014  . White coat hypertension     PAST SURGICAL HISTORY: Past Surgical History:  Procedure Laterality Date  . APPENDECTOMY  2006  . HERNIA REPAIR    . ROTATOR CUFF REPAIR Left 2007    FAMILY HISTORY: Family History  Problem Relation Age of Onset  . Cancer Mother   . Cancer Other 86       PANCREATIC CANCER  . Hypertension Other   . Sleep apnea Brother        CPAP    SOCIAL HISTORY: Social History   Socioeconomic History  . Marital status:  Married    Spouse name: Not on file  . Number of children: 0  . Years of education: HS  . Highest education level: Not on file  Occupational History  . Not on file  Social Needs  . Financial resource strain: Not on file  . Food insecurity    Worry: Not on file    Inability: Not on file  . Transportation needs    Medical: Not on file    Non-medical: Not on file  Tobacco Use  . Smoking status: Heavy Tobacco Smoker    Packs/day: 0.50    Years: 40.00    Pack years: 20.00    Types: Cigarettes    Start date: 04/12/1967    Last attempt to quit: 03/06/2012    Years since quitting: 6.7  . Smokeless tobacco: Never Used  Substance and Sexual Activity  . Alcohol use: No    Alcohol/week: 0.0 standard drinks  . Drug use: No  . Sexual activity: Yes  Lifestyle  . Physical activity    Days per week: Not on file    Minutes per session: Not on file  . Stress: Not on file  Relationships  . Social Herbalist on phone: Not on file    Gets together: Not on file    Attends religious service: Not on file    Active member of club or organization: Not on file    Attends meetings of clubs or organizations: Not on file    Relationship status: Not on file  . Intimate partner violence    Fear of current or ex partner: Not on file    Emotionally abused: Not on file    Physically abused: Not on file    Forced sexual activity: Not on file  Other Topics Concern  . Not on file  Social History Narrative   Patient is married, does not have children.   Patient is right handed.   Patient has high school education.   Patient does not drink caffeine.      PHYSICAL EXAM  Vitals:   11/19/18 0749  BP: (!) 144/79  Pulse: (!) 43  Temp: (!) 97.5 F (36.4 C)  Weight: 226 lb 3.2 oz (102.6 kg)  Height: 5\' 11"  (1.803 m)   Body mass index is  31.55 kg/m.  Generalized: Well developed, in no acute distress  Chest: Lungs clear to auscultation bilaterally  Neurological examination   Mentation: Alert oriented to time, place, history taking. Follows all commands speech and language fluent Cranial nerve II-XII: Extraocular movements were full, visual field were full on confrontational test Head turning and shoulder shrug  were normal and symmetric. Motor: The motor testing reveals 5 over 5 strength of all 4 extremities. Good symmetric motor tone is noted throughout.  Sensory: Sensory testing is intact to soft touch on all 4 extremities. No evidence of extinction is noted.  Gait and station: Gait is normal.    DIAGNOSTIC DATA (LABS, IMAGING, TESTING) - I reviewed patient records, labs, notes, testing and imaging myself where available.  Lab Results  Component Value Date   WBC 12.4 (H) 10/01/2018   HGB 16.3 10/01/2018   HCT 48.4 10/01/2018   MCV 96.4 10/01/2018   PLT 200 10/01/2018      Component Value Date/Time   NA 135 10/01/2018 1624   K 4.0 10/01/2018 1624   CL 100 10/01/2018 1624   CO2 25 10/01/2018 1624   GLUCOSE 102 (H) 10/01/2018 1624   BUN 17 10/01/2018 1624   CREATININE 1.47 (H) 10/01/2018 1624   CALCIUM 8.9 10/01/2018 1624   GFRNONAA 48 (L) 10/01/2018 1624   GFRAA 56 (L) 10/01/2018 1624      ASSESSMENT AND PLAN 68 y.o. year old male  has a past medical history of Anal fissure, Anxiety, Convulsions ([place]), Diverticulitis, Heart murmur, Hiatal hernia with gastroesophageal reflux, HLD (hyperlipidemia), MVP (mitral valve prolapse), Normal cardiac stress test (January 2014), OSA on CPAP (11/11/2013), Sleep apnea, Tinnitus, Tobacco abuse, Tobacco use disorder (03/31/2014), and White coat hypertension. here with:  1. Obstructive sleep apnea on CPAP  The patient's CPAP download shows excellent compliance and good treatment of his apnea.  He is encouraged to continue using CPAP nightly and greater than 4 hours each night.  He is advised that if his symptoms worsen or he develops new symptoms he should let us know.  I will switch his DME company to [employer].   He also would like a new machine if he qualifies.  He will follow-up in 1 year or sooner if needed.    I spent 15 minutes with the patient. 50% of this time was spent reviewing CPAP download   Ward Givens, MSN, NP-C 11/19/2018, 7:59 AM [facility] [address] [place], [place] [address] [phone]

## 2018-11-19 NOTE — Patient Instructions (Addendum)
Your Plan:  Continue using CPAP nightly and greater than 4 hours each night If your symptoms worsen or you develop new symptoms please let us know.   Thank you for coming to see [place] at [facility]. I hope we have been able to provide you high quality care today.  You may receive a patient satisfaction survey over the next few weeks. We would appreciate your feedback and comments so that we may continue to improve ourselves and the health of our patients.   Please call [employer] at [phone], and press option 1 when prompted. Their customer service representatives will be glad to assist you. If they are unable to answer, please leave a message and they will call you back. Make sure to leave your name and return phone number.

## 2018-11-19 NOTE — Progress Notes (Signed)
[facility] received cpap order (transfer care from [facility]).  New machine. sy

## 2018-12-01 ENCOUNTER — Telehealth: Payer: Self-pay | Admitting: *Deleted

## 2018-12-01 NOTE — Telephone Encounter (Signed)
Received message form [employer]: Pt is NOT eligible for a new CPAP right now per Medicare. He got his last machine 09/27/2014, and can only get a machine every 5 years. We can take him as a TOC for supplies, but he can't get a new machine until after 09/28/2019.

## 2018-12-01 NOTE — Telephone Encounter (Signed)
I called pt and relayed per [employer] the message relating to new cpap machine and when he can get one per [employer]. They will take care of his supplies.  He stated he had an appt with them in am.  He was good.

## 2018-12-21 DIAGNOSIS — Z23 Encounter for immunization: Secondary | ICD-10-CM | POA: Diagnosis not present

## 2019-03-04 ENCOUNTER — Ambulatory Visit: Payer: Medicare Other | Attending: Internal Medicine

## 2019-03-04 DIAGNOSIS — Z23 Encounter for immunization: Secondary | ICD-10-CM | POA: Insufficient documentation

## 2019-03-04 NOTE — Progress Notes (Signed)
   Covid-19 Vaccination Clinic  Name:  [name]    MRN: [id number] DOB: [birth year]  03/04/2019  [name] was observed post Covid-19 immunization for 15 minutes without incidence. He was provided with Vaccine Information Sheet and instruction to access the V-Safe system.   [name] was instructed to call 911 with any severe reactions post vaccine: Marland Kitchen Difficulty breathing  . Swelling of your face and throat  . A fast heartbeat  . A bad rash all over your body  . Dizziness and weakness    Immunizations Administered    Name Date Dose VIS Date Route   Pfizer COVID-19 Vaccine 03/04/2019  6:23 PM 0.3 mL 01/23/2019 Intramuscular   Manufacturer: [place]   Lot: [id number]   [place]: [id number]

## 2019-03-23 ENCOUNTER — Ambulatory Visit: Payer: Medicare Other | Attending: Internal Medicine

## 2019-03-23 DIAGNOSIS — Z23 Encounter for immunization: Secondary | ICD-10-CM | POA: Insufficient documentation

## 2019-03-23 NOTE — Progress Notes (Signed)
   Covid-19 Vaccination Clinic  Name:  Michael [name]    MRN: [id number] DOB: 1950/07/12  03/23/2019  [name] was observed post Covid-19 immunization for 15 minutes without incidence. He was provided with Vaccine Information Sheet and instruction to access the V-Safe system.   [name] was instructed to call 911 with any severe reactions post vaccine: Marland Kitchen Difficulty breathing  . Swelling of your face and throat  . A fast heartbeat  . A bad rash all over your body  . Dizziness and weakness    Immunizations Administered    Name Date Dose VIS Date Route   Pfizer COVID-19 Vaccine 03/23/2019  8:26 AM 0.3 mL 01/23/2019 Intramuscular   Manufacturer: [place]   Lot: [id number]   [place]: [id number]

## 2019-05-25 DIAGNOSIS — R7301 Impaired fasting glucose: Secondary | ICD-10-CM | POA: Diagnosis not present

## 2019-05-25 DIAGNOSIS — E7849 Other hyperlipidemia: Secondary | ICD-10-CM | POA: Diagnosis not present

## 2019-05-25 DIAGNOSIS — Z125 Encounter for screening for malignant neoplasm of prostate: Secondary | ICD-10-CM | POA: Diagnosis not present

## 2019-06-01 DIAGNOSIS — R030 Elevated blood-pressure reading, without diagnosis of hypertension: Secondary | ICD-10-CM | POA: Diagnosis not present

## 2019-06-01 DIAGNOSIS — R918 Other nonspecific abnormal finding of lung field: Secondary | ICD-10-CM | POA: Diagnosis not present

## 2019-06-01 DIAGNOSIS — R935 Abnormal findings on diagnostic imaging of other abdominal regions, including retroperitoneum: Secondary | ICD-10-CM | POA: Diagnosis not present

## 2019-06-01 DIAGNOSIS — I10 Essential (primary) hypertension: Secondary | ICD-10-CM | POA: Diagnosis not present

## 2019-06-01 DIAGNOSIS — J309 Allergic rhinitis, unspecified: Secondary | ICD-10-CM | POA: Diagnosis not present

## 2019-06-01 DIAGNOSIS — Z0000 Encounter for general adult medical examination without abnormal findings: Secondary | ICD-10-CM | POA: Diagnosis not present

## 2019-06-01 DIAGNOSIS — N1831 Chronic kidney disease, stage 3a: Secondary | ICD-10-CM | POA: Diagnosis not present

## 2019-06-01 DIAGNOSIS — Z1331 Encounter for screening for depression: Secondary | ICD-10-CM | POA: Diagnosis not present

## 2019-06-01 DIAGNOSIS — R82998 Other abnormal findings in urine: Secondary | ICD-10-CM | POA: Diagnosis not present

## 2019-06-01 DIAGNOSIS — K219 Gastro-esophageal reflux disease without esophagitis: Secondary | ICD-10-CM | POA: Diagnosis not present

## 2019-06-01 DIAGNOSIS — I2510 Atherosclerotic heart disease of native coronary artery without angina pectoris: Secondary | ICD-10-CM | POA: Diagnosis not present

## 2019-06-01 DIAGNOSIS — R3121 Asymptomatic microscopic hematuria: Secondary | ICD-10-CM | POA: Diagnosis not present

## 2019-06-01 DIAGNOSIS — E669 Obesity, unspecified: Secondary | ICD-10-CM | POA: Diagnosis not present

## 2019-06-01 DIAGNOSIS — I129 Hypertensive chronic kidney disease with stage 1 through stage 4 chronic kidney disease, or unspecified chronic kidney disease: Secondary | ICD-10-CM | POA: Diagnosis not present

## 2019-06-01 DIAGNOSIS — G4733 Obstructive sleep apnea (adult) (pediatric): Secondary | ICD-10-CM | POA: Diagnosis not present

## 2019-06-02 DIAGNOSIS — Z1212 Encounter for screening for malignant neoplasm of rectum: Secondary | ICD-10-CM | POA: Diagnosis not present

## 2019-06-05 ENCOUNTER — Other Ambulatory Visit: Payer: Self-pay | Admitting: Internal Medicine

## 2019-06-05 ENCOUNTER — Other Ambulatory Visit: Payer: Self-pay | Admitting: Endocrinology

## 2019-06-05 DIAGNOSIS — R935 Abnormal findings on diagnostic imaging of other abdominal regions, including retroperitoneum: Secondary | ICD-10-CM

## 2019-06-05 DIAGNOSIS — R911 Solitary pulmonary nodule: Secondary | ICD-10-CM

## 2019-07-24 ENCOUNTER — Ambulatory Visit
Admission: RE | Admit: 2019-07-24 | Discharge: 2019-07-24 | Disposition: A | Discharge status: Home / Self Care | Payer: Medicare Other | Source: Ambulatory Visit | Attending: Internal Medicine | Admitting: Internal Medicine

## 2019-07-24 DIAGNOSIS — K760 Fatty (change of) liver, not elsewhere classified: Secondary | ICD-10-CM | POA: Diagnosis not present

## 2019-07-24 DIAGNOSIS — R911 Solitary pulmonary nodule: Secondary | ICD-10-CM

## 2019-07-24 DIAGNOSIS — R935 Abnormal findings on diagnostic imaging of other abdominal regions, including retroperitoneum: Secondary | ICD-10-CM

## 2019-07-24 DIAGNOSIS — K7460 Unspecified cirrhosis of liver: Secondary | ICD-10-CM | POA: Diagnosis not present

## 2019-09-04 DIAGNOSIS — H9042 Sensorineural hearing loss, unilateral, left ear, with unrestricted hearing on the contralateral side: Secondary | ICD-10-CM | POA: Diagnosis not present

## 2019-09-04 DIAGNOSIS — H8102 Meniere's disease, left ear: Secondary | ICD-10-CM | POA: Diagnosis not present

## 2019-09-04 DIAGNOSIS — R42 Dizziness and giddiness: Secondary | ICD-10-CM | POA: Diagnosis not present

## 2019-09-30 DIAGNOSIS — R42 Dizziness and giddiness: Secondary | ICD-10-CM | POA: Diagnosis not present

## 2019-10-07 DIAGNOSIS — H9312 Tinnitus, left ear: Secondary | ICD-10-CM | POA: Diagnosis not present

## 2019-10-07 DIAGNOSIS — H9042 Sensorineural hearing loss, unilateral, left ear, with unrestricted hearing on the contralateral side: Secondary | ICD-10-CM | POA: Diagnosis not present

## 2019-10-07 DIAGNOSIS — R42 Dizziness and giddiness: Secondary | ICD-10-CM | POA: Diagnosis not present

## 2019-11-19 ENCOUNTER — Ambulatory Visit (INDEPENDENT_AMBULATORY_CARE_PROVIDER_SITE_OTHER): Payer: Medicare Other | Admitting: Adult Health

## 2019-11-19 ENCOUNTER — Encounter: Payer: Self-pay | Admitting: Adult Health

## 2019-11-19 ENCOUNTER — Other Ambulatory Visit: Payer: Self-pay

## 2019-11-19 VITALS — BP 122/70 | HR 48 | Ht 71.0 in | Wt 234.6 lb

## 2019-11-19 DIAGNOSIS — Z9989 Dependence on other enabling machines and devices: Secondary | ICD-10-CM

## 2019-11-19 DIAGNOSIS — G4733 Obstructive sleep apnea (adult) (pediatric): Secondary | ICD-10-CM | POA: Diagnosis not present

## 2019-11-19 NOTE — Progress Notes (Signed)
PATIENT: Michael [name] DOB: 03/02/1950  REASON FOR VISIT: follow up HISTORY FROM: patient  HISTORY OF PRESENT ILLNESS: Today 11/19/19:  Mr. Haliburton is a 69 year old male with a history of obstructive sleep apnea on CPAP.  His download indicates that he uses machine nightly for compliance of 100%.  He uses machine greater than 4 hours each night.  On average he uses his machine 10 hours and 6 minutes.  His residual AHI is 0.2 on 8 cm of water with EPR of 1.  Overall he reports that the CPAP continues to work well for him.  He does have an old machine and would like to get a new machine if possible.  HISTORY 11/19/18:  Mr. Pietrzak is a 69 year old male with a history of obstructive sleep apnea on CPAP.  His download indicates that he uses machine 89 9090 days for compliance of 99%.  He uses machine greater than 4 hours each night.  On average he uses his machine 10 hours and 36 minutes.  His residual AHI is 0.3 on 8 cm of water with EPR of 1.  He does not have a significant leak.  He reports that the CPAP continues to work well for him.  He denies any new issues.  He would like to switch to a new DME company.  He returns today for an evaluation.  REVIEW OF SYSTEMS: Out of a complete 14 system review of symptoms, the patient complains only of the following symptoms, and all other reviewed systems are negative.  FSS 16 ESS 3  ALLERGIES: No Known Allergies  HOME MEDICATIONS: Outpatient Medications Prior to Visit  Medication Sig Dispense Refill  . aspirin 81 MG tablet Take 81 mg by mouth daily.      . B Complex CAPS Take 1 capsule by mouth daily.     . cholecalciferol (VITAMIN D) 1000 UNITS tablet Take 1,000 Units by mouth daily.      Marland Kitchen FIBER COMPLETE PO Take 1 tablet by mouth daily.     Marland Kitchen loratadine (CLARITIN) 10 MG tablet Take 10 mg by mouth as needed for allergies.     Marland Kitchen losartan (COZAAR) 25 MG tablet Take 50 mg by mouth at bedtime.     . nebivolol (BYSTOLIC) 5 MG tablet Take 1  tablet (5 mg total) by mouth daily. (Patient taking differently: Take 5 mg by mouth at bedtime. ) 30 tablet 5  . pantoprazole (PROTONIX) 40 MG tablet Take 40 mg by mouth daily.    . sertraline (ZOLOFT) 50 MG tablet Take 50 mg by mouth daily.    . simvastatin (ZOCOR) 20 MG tablet Take 20 mg by mouth at bedtime.      . tamsulosin (FLOMAX) 0.4 MG CAPS capsule Take 0.4 mg by mouth daily.     No facility-administered medications prior to visit.    PAST MEDICAL HISTORY: Past Medical History:  Diagnosis Date  . Anal fissure   . Anxiety   . Convulsions ([place])    AS A CHILD  . Diverticulitis   . Heart murmur   . Hiatal hernia with gastroesophageal reflux   . HLD (hyperlipidemia)   . MVP (mitral valve prolapse)    with moderate MR per echo in 2009  . Normal cardiac stress test January 2014  . OSA on CPAP 11/11/2013  . Sleep apnea    wears CPAP  . Tinnitus   . Tobacco abuse   . Tobacco use disorder 03/31/2014  . White coat hypertension  PAST SURGICAL HISTORY: Past Surgical History:  Procedure Laterality Date  . APPENDECTOMY  2006  . HERNIA REPAIR    . ROTATOR CUFF REPAIR Left 2007    FAMILY HISTORY: Family History  Problem Relation Age of Onset  . Cancer Mother   . Cancer Other 86       PANCREATIC CANCER  . Hypertension Other   . Sleep apnea Brother        CPAP    SOCIAL HISTORY: Social History   Socioeconomic History  . Marital status: Married    Spouse name: Not on file  . Number of children: 0  . Years of education: HS  . Highest education level: Not on file  Occupational History  . Not on file  Tobacco Use  . Smoking status: Heavy Tobacco Smoker    Packs/day: 0.50    Years: 40.00    Pack years: 20.00    Types: Cigarettes    Start date: 04/12/1967    Last attempt to quit: 03/06/2012    Years since quitting: 7.7  . Smokeless tobacco: Never Used  Vaping Use  . Vaping Use: Never used  Substance and Sexual Activity  . Alcohol use: No    Alcohol/week: 0.0  standard drinks  . Drug use: No  . Sexual activity: Yes  Other Topics Concern  . Not on file  Social History Narrative   Patient is married, does not have children.   Patient is right handed.   Patient has high school education.   Patient does not drink caffeine.   Social Determinants of Health   Financial Resource Strain:   . Difficulty of Paying Living Expenses: Not on file  Food Insecurity:   . Worried About Charity fundraiser in the Last Year: Not on file  . Ran Out of Food in the Last Year: Not on file  Transportation Needs:   . Lack of Transportation (Medical): Not on file  . Lack of Transportation (Non-Medical): Not on file  Physical Activity:   . Days of Exercise per Week: Not on file  . Minutes of Exercise per Session: Not on file  Stress:   . Feeling of Stress : Not on file  Social Connections:   . Frequency of Communication with Friends and Family: Not on file  . Frequency of Social Gatherings with Friends and Family: Not on file  . Attends Religious Services: Not on file  . Active Member of Clubs or Organizations: Not on file  . Attends Archivist Meetings: Not on file  . Marital Status: Not on file  Intimate Partner Violence:   . Fear of Current or Ex-Partner: Not on file  . Emotionally Abused: Not on file  . Physically Abused: Not on file  . Sexually Abused: Not on file      PHYSICAL EXAM  Vitals:   11/19/19 0816  BP: 122/70  Pulse: (!) 48  Weight: 234 lb 9.6 oz (106.4 kg)  Height: 5\' 11"  (1.803 m)   Body mass index is 32.72 kg/m.  Generalized: Well developed, in no acute distress  Chest: Lungs clear to auscultation bilaterally  Neurological examination  Mentation: Alert oriented to time, place, history taking. Follows all commands speech and language fluent Cranial nerve II-XII: Extraocular movements were full, visual field were full on confrontational test Head turning and shoulder shrug  were normal and symmetric.  Neck  circumference 19-1/2 inches Motor: The motor testing reveals 5 over 5 strength of all 4 extremities. Good symmetric  motor tone is noted throughout.  Sensory: Sensory testing is intact to soft touch on all 4 extremities. No evidence of extinction is noted.  Gait and station: Gait is normal.    DIAGNOSTIC DATA (LABS, IMAGING, TESTING) - I reviewed patient records, labs, notes, testing and imaging myself where available.  Lab Results  Component Value Date   WBC 12.4 (H) 10/01/2018   HGB 16.3 10/01/2018   HCT 48.4 10/01/2018   MCV 96.4 10/01/2018   PLT 200 10/01/2018      Component Value Date/Time   NA 135 10/01/2018 1624   K 4.0 10/01/2018 1624   CL 100 10/01/2018 1624   CO2 25 10/01/2018 1624   GLUCOSE 102 (H) 10/01/2018 1624   BUN 17 10/01/2018 1624   CREATININE 1.47 (H) 10/01/2018 1624   CALCIUM 8.9 10/01/2018 1624   GFRNONAA 48 (L) 10/01/2018 1624   GFRAA 56 (L) 10/01/2018 1624   No results found for: CHOL, HDL, LDLCALC, LDLDIRECT, TRIG, CHOLHDL No results found for: HGBA1C No results found for: VITAMINB12 No results found for: TSH    ASSESSMENT AND PLAN 69 y.o. year old male  has a past medical history of Anal fissure, Anxiety, Convulsions ([address]), Diverticulitis, Heart murmur, Hiatal hernia with gastroesophageal reflux, HLD (hyperlipidemia), MVP (mitral valve prolapse), Normal cardiac stress test (January 2014), OSA on CPAP (11/11/2013), Sleep apnea, Tinnitus, Tobacco abuse, Tobacco use disorder (03/31/2014), and White coat hypertension. here with:  1. OSA on CPAP  - CPAP compliance excellent - Good treatment of AHI  - Encourage patient to use CPAP nightly and > 4 hours each night -Home sleep test ordered--patient would like a new machine - F/U in 1 year or sooner if needed   I spent 20 minutes of face-to-face and non-face-to-face time with patient.  This included previsit chart review, lab review, study review, order entry, electronic health record documentation,  patient education.  Ward Givens, MSN, NP-C 11/19/2019, 9:00 AM [facility] [address] [place],  [address] [phone]

## 2019-11-19 NOTE — Patient Instructions (Signed)
Your Plan:  Home sleep test If your symptoms worsen or you develop new symptoms please let us know.    Thank you for coming to see [place] at [facility]. I hope we have been able to provide you high quality care today.  You may receive a patient satisfaction survey over the next few weeks. We would appreciate your feedback and comments so that we may continue to improve ourselves and the health of our patients.

## 2019-11-25 DIAGNOSIS — H2513 Age-related nuclear cataract, bilateral: Secondary | ICD-10-CM | POA: Diagnosis not present

## 2019-11-30 ENCOUNTER — Other Ambulatory Visit: Payer: Self-pay

## 2019-11-30 ENCOUNTER — Ambulatory Visit: Payer: Medicare Other | Attending: Otolaryngology

## 2019-11-30 DIAGNOSIS — R42 Dizziness and giddiness: Secondary | ICD-10-CM | POA: Insufficient documentation

## 2019-11-30 DIAGNOSIS — R262 Difficulty in walking, not elsewhere classified: Secondary | ICD-10-CM | POA: Diagnosis not present

## 2019-11-30 DIAGNOSIS — R2681 Unsteadiness on feet: Secondary | ICD-10-CM | POA: Diagnosis not present

## 2019-11-30 NOTE — Patient Instructions (Signed)
Gaze Stabilization: Sitting    Keeping eyes on target on wall 3 feet away, tilt head down 15-30 and move head side to side for 60 seconds. Repeat while moving head up and down for 60 seconds. Do 2-3 sessions per day.  Copyright  VHI. All rights reserved.

## 2019-11-30 NOTE — Therapy (Signed)
[facility] [place] [address] [place], [place], [address] Phone: [phone]   Fax:  [phone]  Physical Therapy Evaluation  Patient Details  Name: Michael [name] MRN: [id number] Date of Birth: [birth year] Referring Provider (PT): Dr. Benjamine Mola   Encounter Date: 11/30/2019   PT End of Session - 11/30/19 0930    Visit Number 1    Number of Visits 7    Date for PT Re-Evaluation 01/29/20   POC for 6 weeks, Cert for 60 days   Authorization Type Medicare Part A + B    Progress Note Due on Visit 10    PT Start Time 0846    PT Stop Time 0928    PT Time Calculation (min) 42 min    Equipment Utilized During Treatment Gait belt    Activity Tolerance Patient tolerated treatment well    Behavior During Therapy [facility] for tasks assessed/performed           Past Medical History:  Diagnosis Date   Anal fissure    Anxiety    Convulsions ([place])    AS A CHILD   Diverticulitis    Heart murmur    Hiatal hernia with gastroesophageal reflux    HLD (hyperlipidemia)    MVP (mitral valve prolapse)    with moderate MR per echo in 2009   Normal cardiac stress test January 2014   OSA on CPAP 11/11/2013   Sleep apnea    wears CPAP   Tinnitus    Tobacco abuse    Tobacco use disorder 03/31/2014   White coat hypertension     Past Surgical History:  Procedure Laterality Date   APPENDECTOMY  2006   HERNIA REPAIR     ROTATOR CUFF REPAIR Left 2007    There were no vitals filed for this visit.    Subjective Assessment - 11/30/19 0848    Subjective Patient reports chronic dizziness as well as imbalance. Patient has a history of Meniere's Disease for the past 10 years with L ear affected, initially dizziness was episodic and has transitioned to chronic. Also has asymmetric left ear sensorineural hearing loss. Left ear caloric test was abornmal suggestive of unilateral left ear vestibular hypofunction. Also reports worsening of  tinnutus. Patient also reports that feels imbalanced, especially when eyes are closed. No falls. Patient does report some lightheadedness at times. Patient reports minimal tinnitus in the ears, does report hearing aids in both ears. Patient reports does not play as golf as often, feels imbalanced with this.    Pertinent History HLD, MVP, Sleep Apnea with CPAP, Tinnitus, Diverticulitis, History of Meniere's Disease    Limitations Walking;Standing    Patient Stated Goals Eliminate the Dizziness    Currently in Pain? No/denies              [facility] PT Assessment - 11/30/19 0001      Assessment   Medical Diagnosis Chronic Dizziness/Imbalance    Referring Provider (PT) Dr. Benjamine Mola    Onset Date/Surgical Date 10/21/19   referral date; meniere's (10 years ago)    Hand Dominance Right    Prior Therapy OPPT for Shoulder      Precautions   Precautions Fall      Balance Screen   Has the patient fallen in the past 6 months No    Has the patient had a decrease in activity level because of a fear of falling?  No    Is the patient reluctant to leave their home because  of a fear of falling?  No      Home Environment   Living Environment Private residence    Living Arrangements Spouse/significant other    Available Help at Discharge Family    Type of [place] to enter    Entrance Stairs-Number of Steps 5    Entrance Stairs-Rails Right;Left;Can reach both    [place] None    Additional Comments has 13 stairs going to basement; goes down stairs frequently, has rails.       Prior Function   Level of Independence Independent    Vocation Retired    Leisure Golf; [employer]; Engineering geologist to go to [employer]   Overall Cognitive Status Within Functional Limits for tasks assessed      Observation/Other Assessments   Focus on Therapeutic Outcomes (FOTO)  89/100    Other Surveys  Dizziness Handicap Inventory (DHI)    Dizziness Handicap  Inventory ([place])  16/100      Sensation   Light Touch Appears Intact    Additional Comments for BLE's       Transfers   Transfers Sit to Stand;Stand to Sit    Sit to Stand 6: Modified independent (Device/Increase time)    Stand to Sit 6: Modified independent (Device/Increase time)      Ambulation/Gait   Ambulation/Gait Yes    Ambulation/Gait Assistance 5: Supervision    Ambulation/Gait Assistance Details supervision with ambulation for safety    Gait Pattern Step-through pattern      High Level Balance   High Level Balance Comments Completed M - CTSIB: patient able to hold situation 1 - 30 seconds, situation 2 - 30 seconds (increased sway noted), situation 3 - 30 seconds, situation 4 - 7 seconds (average of trials). Increased difficulty and sway noted with situation 4.       Functional Gait  Assessment   Gait assessed  Yes    Gait Level Surface Walks 20 ft in less than 7 sec but greater than 5.5 sec, uses assistive device, slower speed, mild gait deviations, or deviates 6-10 in outside of the 12 in walkway width.    Change in Gait Speed Able to smoothly change walking speed without loss of balance or gait deviation. Deviate no more than 6 in outside of the 12 in walkway width.    Gait with Horizontal Head Turns Performs head turns with moderate changes in gait velocity, slows down, deviates 10-15 in outside 12 in walkway width but recovers, can continue to walk.    Gait with Vertical Head Turns Performs task with slight change in gait velocity (eg, minor disruption to smooth gait path), deviates 6 - 10 in outside 12 in walkway width or uses assistive device    Gait and Pivot Turn Pivot turns safely within 3 sec and stops quickly with no loss of balance.    Step Over Obstacle Is able to step over 2 stacked shoe boxes taped together (9 in total height) without changing gait speed. No evidence of imbalance.    Gait with Narrow Base of Support Ambulates 7-9 steps.    Gait with Eyes Closed  Walks 20 ft, slow speed, abnormal gait pattern, evidence for imbalance, deviates 10-15 in outside 12 in walkway width. Requires more than 9 sec to ambulate 20 ft.    Ambulating Backwards Walks 20 ft, uses assistive device, slower speed, mild gait deviations, deviates 6-10 in  outside 12 in walkway width.    Steps Alternating feet, no rail.    Total Score 22    FGA comment: 22/30 = Medium Fall Risk                  Vestibular Assessment - 11/30/19 0001      Symptom Behavior   Subjective history of current problem  Patient has a history of Meniere's Disease for the past 10 years with L ear affected, initially dizziness was episodic and has transitioned to chronic. Also has asymmetric left ear sensorineural hearing loss. Left ear caloric test was abornmal suggestive of unilateral left ear vestibular hypofunction. Patient has had no recent attacks of meniere's. Patient currently has bifocals.     Type of Dizziness  Imbalance;Unsteady with head/body turns;Lightheadedness    Frequency of Dizziness "just when I am moving"     Duration of Dizziness duration of movement    Symptom Nature Motion provoked;Intermittent    Aggravating Factors Turning head quickly;Turning body quickly;Turning head sideways;Rolling to right;Rolling to left;Sit to stand    Relieving Factors Head stationary;Rest    Progression of Symptoms No change since onset      Oculomotor Exam   Oculomotor Alignment Normal    Ocular ROM WFL    Spontaneous Absent    Saccades Intact      Vestibulo-Ocular Reflex   VOR 1 Head Only (x 1 viewing) mild increase in symptoms    VOR Cancellation Normal      Visual Acuity   Static 9    Dynamic 8              Objective measurements completed on examination: See above findings.       [facility] Adult PT Treatment/Exercise - 11/30/19 0001      Ambulation/Gait   Ambulation Distance (Feet) 115 Feet    Assistive device None    Ambulation Surface Level;Indoor    Gait velocity  8.9 secs = 3.68 ft/sec    Stairs Yes    Stairs Assistance 6: Modified independent (Device/Increase time)    Stair Management Technique Alternating pattern;Forwards    Number of Stairs 4    Height of Stairs 6                  PT Education - 11/30/19 0924    Education Details Evaluation Findings, POC, and Gaze Stabilization VOR x 1    Person(s) Educated Patient    Methods Explanation;Demonstration;Handout    Comprehension Verbalized understanding;Returned demonstration;Verbal cues required            PT Short Term Goals - 11/30/19 1215      PT SHORT TERM GOAL #1   Title Patient will be independent with initial HEP for balance/vestibular (All STGS due: 12/21/19)    Baseline VOR x 1 initiated    Time 3    Period Weeks    Status New    Target Date 12/21/19      PT SHORT TERM GOAL #2   Title Patient will improve FGA to >/= 24/30 to demonstrate improved balance and reduced fall risk    Baseline 22/30    Time 3    Period Weeks    Status New      PT SHORT TERM GOAL #3   Title Patient will demo ability to hold situation 4 of M-CSTIB for >/= 15 seconds to demonstrate improved balance    Baseline 7 secs    Time 3    Period Weeks  Status New             PT Long Term Goals - 11/30/19 1217      PT LONG TERM GOAL #1   Title Patient will be independent with final HEP for vestibular/balance (ALL LTGS due: 01/11/20)    Baseline VOR x 1 established    Time 6    Period Weeks    Status New    Target Date 01/11/20      PT LONG TERM GOAL #2   Title Patient will improve FGA to >/= 26/30 to demonstrate improved balance and low fall risk    Baseline 22/30    Time 6    Period Weeks    Status New      PT LONG TERM GOAL #3   Title Patient will demo ability to ambulate > 100 ft with horizontal head turns without LOB to demonstrate improved scanning environemnt with community ambulation    Baseline instability with horizontal head turns    Time 6    Period Weeks     Status New      PT LONG TERM GOAL #4   Title Patient will demo ability tol hold situation 4 of M-CSTIB for >/= 25 seconds to demo improved balance and vestibular function    Baseline 7 seconds    Time 6    Period Weeks    Status New      PT LONG TERM GOAL #5   Title Patient will improve DHI to </= 10 to demonstrate reduced dizziness and improved tolerance for tasks    Baseline 16/100    Time 6    Period Weeks    Status New                  Plan - 11/30/19 1221    Clinical Impression Statement Patient is a 69 y.o. male that was referred to Neuro OPPT services for Dizziness and Imbalance. Patient's PMH is significant for the following: HLD, MVP, Sleep Apnea with CPAP, Tinnitus, Diverticulitis, History of Meniere's Disease. Patient has history of Meniere's Disease for the past 10 years with L ear affected. Left ear caloric test was abornmal suggestive of unilateral left ear vestibular hypofunction. Patient presents with following impairments upon evaluations: decreased balance, dizziness, abnormal gait, and increased fall risk. Patient will benefit from skilled PT services to progress toward all goals and maximize functional mobility.    Personal Factors and Comorbidities Comorbidity 3+;Time since onset of injury/illness/exacerbation    Comorbidities HLD, MVP, Sleep Apnea with CPAP, Tinnitus, Diverticulitis, History of Meniere's Disease    Examination-Activity Limitations Stand;Locomotion Level;Transfers;Bed Mobility;Lift    Examination-Participation Restrictions Yard Work;Community Activity    Stability/Clinical Decision Making Evolving/Moderate complexity    Clinical Decision Making Moderate    Rehab Potential Good    PT Frequency 1x / week    PT Duration 6 weeks    PT Treatment/Interventions ADLs/Self Care Home Management;Canalith Repostioning;Electrical Stimulation;Moist Heat;DME Instruction;Gait training;Stair training;Functional mobility training;Therapeutic  activities;Therapeutic exercise;Balance training;Neuromuscular re-education;Patient/family education;Manual techniques;Vestibular;Passive range of motion    PT Next Visit Plan Initiate Balance HEP    Consulted and Agree with Plan of Care Patient           Patient will benefit from skilled therapeutic intervention in order to improve the following deficits and impairments:  Decreased balance, Difficulty walking, Dizziness, Decreased activity tolerance, Abnormal gait  Visit Diagnosis: Dizziness and giddiness  Unsteadiness on feet  Difficulty in walking, not elsewhere classified     Problem  List Patient Active Problem List   Diagnosis Date Noted   Nicotine use disorder 11/09/2015   HTN, goal below 130/80 11/09/2015   Tobacco use disorder 03/31/2014   OSA on CPAP 11/11/2013   Hypertension 06/06/2010   MVP (mitral valve prolapse)    HLD (hyperlipidemia)    Sleep apnea     Jones Bales, PT, DPT 11/30/2019, 12:26 PM  [place] [address] [place], [place], [address] Phone: [phone]   Fax:  [phone]  Name: KAEDIN HICKLIN MRN: [id number] Date of Birth: [birth year]

## 2019-12-09 ENCOUNTER — Ambulatory Visit: Payer: Medicare Other

## 2019-12-09 ENCOUNTER — Other Ambulatory Visit: Payer: Self-pay

## 2019-12-09 DIAGNOSIS — R2681 Unsteadiness on feet: Secondary | ICD-10-CM

## 2019-12-09 DIAGNOSIS — R42 Dizziness and giddiness: Secondary | ICD-10-CM

## 2019-12-09 DIAGNOSIS — R262 Difficulty in walking, not elsewhere classified: Secondary | ICD-10-CM

## 2019-12-09 NOTE — Patient Instructions (Addendum)
Gaze Stabilization: Standing Feet Apart    Feet shoulder width apart, keeping eyes on target on wall 3-4 feet away, tilt head down 15-30 and move head side to side for 60 seconds. Repeat while moving head up and down for 60 seconds. Do 2-3 sessions per day.   Access Code: [email or web address] URL: [email or web address] Date: 12/09/2019 Prepared by: Baldomero Lamy  Exercises Tandem Walking with Counter Support - 1 x daily - 5 x weekly - 3 sets - 10 reps Romberg Stance Eyes Closed on Foam Pad - 1 x daily - 5 x weekly - 1 sets - 3 reps - 30 hold Romberg Stance on Foam Pad with Head Rotation - 1 x daily - 5 x weekly - 2 sets - 10 reps Feet Apart with Head Nods on Foam Pad - 1 x daily - 5 x weekly - 2 sets - 10 reps Tandem Stance - 1 x daily - 5 x weekly - 1 sets - 3 reps - 30 hold Walking with Head Rotation - 1 x daily - 5 x weekly - 2 sets - 10 reps  Copyright  VHI. All rights reserved.

## 2019-12-09 NOTE — Therapy (Signed)
[facility] [address] [place], [place], [address] Phone: [phone]   Fax:  [phone]  Physical Therapy Treatment  Patient Details  Name: Michael [name] MRN: [id number] Date of Birth: Dec 18, 1950 Referring Provider (PT): Dr. Benjamine Mola   Encounter Date: 12/09/2019   PT End of Session - 12/09/19 0804    Visit Number 2    Number of Visits 7    Date for PT Re-Evaluation 01/29/20   POC for 6 weeks, Cert for 60 days   Authorization Type Medicare Part A + B    Progress Note Due on Visit 10    PT Start Time 0801    PT Stop Time 0843    PT Time Calculation (min) 42 min    Equipment Utilized During Treatment Gait belt    Activity Tolerance Patient tolerated treatment well    Behavior During Therapy [facility] for tasks assessed/performed           Past Medical History:  Diagnosis Date  . Anal fissure   . Anxiety   . Convulsions ([place])    AS A CHILD  . Diverticulitis   . Heart murmur   . Hiatal hernia with gastroesophageal reflux   . HLD (hyperlipidemia)   . MVP (mitral valve prolapse)    with moderate MR per echo in 2009  . Normal cardiac stress test January 2014  . OSA on CPAP 11/11/2013  . Sleep apnea    wears CPAP  . Tinnitus   . Tobacco abuse   . Tobacco use disorder 03/31/2014  . White coat hypertension     Past Surgical History:  Procedure Laterality Date  . APPENDECTOMY  2006  . HERNIA REPAIR    . ROTATOR CUFF REPAIR Left 2007    There were no vitals filed for this visit.   Subjective Assessment - 12/09/19 0802    Subjective Patient reports no new changes/complaints since last visit. Patient reports no dizziness this morning. No falls to report. Reports VOR x 1 is going well.    Pertinent History HLD, MVP, Sleep Apnea with CPAP, Tinnitus, Diverticulitis, History of Meniere's Disease    Limitations Walking;Standing    Patient Stated Goals Eliminate the Dizziness    Currently in Pain? No/denies              Completed the following balance exercises as established initial HEP. Educated patient to complete exercises in a corner with chair placed in front for safety, and with tandem walking/walking with head rotation to complete in narrow hallway or at countertop. Educated patient on use of Medbridge app as well.    Access Code: [email or web address] URL: [email or web address] Date: 12/09/2019 Prepared by: Baldomero Lamy  Exercises Tandem Walking with Counter Support - 1 x daily - 5 x weekly - 3 sets - 10 reps Romberg Stance Eyes Closed on Foam Pad - 1 x daily - 5 x weekly - 1 sets - 3 reps - 30 hold Romberg Stance on Foam Pad with Head Rotation - 1 x daily - 5 x weekly - 2 sets - 10 reps Feet Apart with Head Nods on Foam Pad - 1 x daily - 5 x weekly - 2 sets - 10 reps Tandem Stance - 1 x daily - 5 x weekly - 1 sets - 3 reps - 30 hold Walking with Head Rotation - 1 x daily - 5 x weekly - 2 sets - 10 reps      Vestibular Treatment/Exercise - 12/09/19 0001  Vestibular Treatment/Exercise   Vestibular Treatment Provided Gaze    Gaze Exercises X1 Viewing Horizontal;X1 Viewing Vertical      X1 Viewing Horizontal   Foot Position seated, standing feet apart    Reps 2    Comments x 60 secs each      X1 Viewing Vertical   Foot Position seated, standing feet apart    Reps 2    Comments x 60 secs each                 PT Education - 12/09/19 0811    Education Details Initial Balance HEP, VOR x 1 Standing    Person(s) Educated Patient    Methods Explanation;Demonstration;Handout    Comprehension Verbalized understanding;Returned demonstration            PT Short Term Goals - 11/30/19 1215      PT SHORT TERM GOAL #1   Title Patient will be independent with initial HEP for balance/vestibular (All STGS due: 12/21/19)    Baseline VOR x 1 initiated    Time 3    Period Weeks    Status New    Target Date 12/21/19      PT SHORT TERM GOAL #2   Title Patient will  improve FGA to >/= 24/30 to demonstrate improved balance and reduced fall risk    Baseline 22/30    Time 3    Period Weeks    Status New      PT SHORT TERM GOAL #3   Title Patient will demo ability to hold situation 4 of M-CSTIB for >/= 15 seconds to demonstrate improved balance    Baseline 7 secs    Time 3    Period Weeks    Status New             PT Long Term Goals - 11/30/19 1217      PT LONG TERM GOAL #1   Title Patient will be independent with final HEP for vestibular/balance (ALL LTGS due: 01/11/20)    Baseline VOR x 1 established    Time 6    Period Weeks    Status New    Target Date 01/11/20      PT LONG TERM GOAL #2   Title Patient will improve FGA to >/= 26/30 to demonstrate improved balance and low fall risk    Baseline 22/30    Time 6    Period Weeks    Status New      PT LONG TERM GOAL #3   Title Patient will demo ability to ambulate > 100 ft with horizontal head turns without LOB to demonstrate improved scanning environemnt with community ambulation    Baseline instability with horizontal head turns    Time 6    Period Weeks    Status New      PT LONG TERM GOAL #4   Title Patient will demo ability tol hold situation 4 of M-CSTIB for >/= 25 seconds to demo improved balance and vestibular function    Baseline 7 seconds    Time 6    Period Weeks    Status New      PT LONG TERM GOAL #5   Title Patient will improve DHI to </= 10 to demonstrate reduced dizziness and improved tolerance for tasks    Baseline 16/100    Time 6    Period Weeks    Status New  Plan - 12/09/19 0846    Clinical Impression Statement Today's skilled PT session included progression of VOR x 1 in standing as patient completing well in seated position with minimal symptoms. Spent rest of session establishing initial HEP for balance, focus on horizontal/vertical head turns, balance on complaint surfaces, and with narrow BOS. Patient tolerating all exercises  well today. Will continue to progress toward all goals.    Personal Factors and Comorbidities Comorbidity 3+;Time since onset of injury/illness/exacerbation    Comorbidities HLD, MVP, Sleep Apnea with CPAP, Tinnitus, Diverticulitis, History of Meniere's Disease    Examination-Activity Limitations Stand;Locomotion Level;Transfers;Bed Mobility;Lift    Examination-Participation Restrictions Yard Work;Community Activity    Stability/Clinical Decision Making Evolving/Moderate complexity    Rehab Potential Good    PT Frequency 1x / week    PT Duration 6 weeks    PT Treatment/Interventions ADLs/Self Care Home Management;Canalith Repostioning;Electrical Stimulation;Moist Heat;DME Instruction;Gait training;Stair training;Functional mobility training;Therapeutic activities;Therapeutic exercise;Balance training;Neuromuscular re-education;Patient/family education;Manual techniques;Vestibular;Passive range of motion    PT Next Visit Plan How is HEP? Rockerboard, Balance Beam. Balance exercises with vision removed.    Consulted and Agree with Plan of Care Patient           Patient will benefit from skilled therapeutic intervention in order to improve the following deficits and impairments:  Decreased balance, Difficulty walking, Dizziness, Decreased activity tolerance, Abnormal gait  Visit Diagnosis: Dizziness and giddiness  Unsteadiness on feet  Difficulty in walking, not elsewhere classified     Problem List Patient Active Problem List   Diagnosis Date Noted  . Nicotine use disorder 11/09/2015  . HTN, goal below 130/80 11/09/2015  . Tobacco use disorder 03/31/2014  . OSA on CPAP 11/11/2013  . Hypertension 06/06/2010  . MVP (mitral valve prolapse)   . HLD (hyperlipidemia)   . Sleep apnea     Jones Bales, PT, DPT 12/09/2019, 8:49 AM  [facility] [address] [place], [place], [address] Phone: [phone]   Fax:   [phone]  Name: Michael [name] MRN: [id number] Date of Birth: December 16, 1950

## 2019-12-14 ENCOUNTER — Other Ambulatory Visit: Payer: Self-pay

## 2019-12-14 ENCOUNTER — Ambulatory Visit: Payer: Medicare Other | Attending: Otolaryngology

## 2019-12-14 DIAGNOSIS — Z23 Encounter for immunization: Secondary | ICD-10-CM | POA: Diagnosis not present

## 2019-12-14 DIAGNOSIS — R262 Difficulty in walking, not elsewhere classified: Secondary | ICD-10-CM

## 2019-12-14 DIAGNOSIS — R42 Dizziness and giddiness: Secondary | ICD-10-CM | POA: Insufficient documentation

## 2019-12-14 DIAGNOSIS — R2681 Unsteadiness on feet: Secondary | ICD-10-CM | POA: Insufficient documentation

## 2019-12-14 NOTE — Therapy (Signed)
[facility] [address] [place], [place], [address] Phone: [phone]   Fax:  [phone]  Physical Therapy Treatment  Patient Details  Name: Michael [name] MRN: [id number] Date of Birth: 08-19-1950 Referring Provider (PT): Dr. Benjamine Mola   Encounter Date: 12/14/2019   PT End of Session - 12/14/19 0805    Visit Number 3    Number of Visits 7    Date for PT Re-Evaluation 01/29/20   POC for 6 weeks, Cert for 60 days   Authorization Type Medicare Part A + B    Progress Note Due on Visit 10    PT Start Time 0803    PT Stop Time 0844    PT Time Calculation (min) 41 min    Equipment Utilized During Treatment Gait belt    Activity Tolerance Patient tolerated treatment well    Behavior During Therapy [facility] for tasks assessed/performed           Past Medical History:  Diagnosis Date  . Anal fissure   . Anxiety   . Convulsions ([place])    AS A CHILD  . Diverticulitis   . Heart murmur   . Hiatal hernia with gastroesophageal reflux   . HLD (hyperlipidemia)   . MVP (mitral valve prolapse)    with moderate MR per echo in 2009  . Normal cardiac stress test January 2014  . OSA on CPAP 11/11/2013  . Sleep apnea    wears CPAP  . Tinnitus   . Tobacco abuse   . Tobacco use disorder 03/31/2014  . White coat hypertension     Past Surgical History:  Procedure Laterality Date  . APPENDECTOMY  2006  . HERNIA REPAIR    . ROTATOR CUFF REPAIR Left 2007    There were no vitals filed for this visit.   Subjective Assessment - 12/14/19 0805    Subjective patient reports no changes in medications. no falls or pain to report. Reports HEP going well, tandem walking is the hardest but can do it.    Pertinent History HLD, MVP, Sleep Apnea with CPAP, Tinnitus, Diverticulitis, History of Meniere's Disease    Limitations Walking;Standing    Patient Stated Goals Eliminate the Dizziness    Currently in Pain? No/denies                  [facility] Adult PT Treatment/Exercise - 12/14/19 0001      Ambulation/Gait   Ambulation/Gait Yes    Ambulation/Gait Assistance 5: Supervision    Ambulation/Gait Assistance Details throughout therapy with activities and high level balance    Assistive device None    Gait Pattern Step-through pattern    Ambulation Surface Level;Unlevel      High Level Balance   High Level Balance Activities Head turns    High Level Balance Comments Completed ambulation with horizontal/vertical head turns, completed 1 x 200 ft each. increased difficulty with horizontal > vertical head turns noted. intermittent CGA required, slowed pace noted with completion of head turns.               Balance Exercises - 12/14/19 0001      Balance Exercises: Standing   Tandem Stance Eyes open;Eyes closed;3 reps;Limitations    Tandem Stance Time 30 secs; completed x 3 reps with full tandem with EO, then progressed to completing 3/4 tandem with EC. Increased balance challenge with EC noted. intermittent UE support and CGA required.     Stepping Strategy Anterior;Posterior;Foam/compliant surface;Limitations  Stepping Strategy Limitations on blue balance beam, completed anterior/posterior stepping strategy x 15 reps each without UE support. increased balance challenge with stepping back on balance beam, CGA. Patient demo proper use of ankle strategy with completion.     Rockerboard Anterior/posterior;Lateral;EO;EC;Intermittent UE support;Limitations    Rockerboard Limitations standing on rockerboard A/P, completed EO x 1 minute, then EC 3 x 30 seconds. Progressed to completing horizontal/vertical head turns 1 x 15 reps. With board positioned laterally, completed EO X 1 min, then EC 3 x 15-25 secs. Then completed horizontal/vertical head turns 1 x 15 reps each. increased challenge with activites with board positioned laterally, intermittent LOB to L side requiring CGA and UE support from // Bars.     Balance Beam  standing on blue balance beam without UE support, completed holding steady x 2 minutes.     Tandem Gait Forward;Intermittent upper extremity support;3 reps;Limitations    Tandem Gait Limitations in // bars, completed tandem walking x 4 laps down and back with intermittent UE support as needed. progressed to completing with looking ahead vs. visual realiance of looking down.     Sidestepping Foam/compliant support;3 reps;Limitations    Sidestepping Limitations on blue balance beam completed side stepping x 3 laps, down and back with intermittent UE support and CGA from PT. Verbal cues for slowed pace to further promote balance.              PT Education - 12/14/19 0945    Education Details Addition of 3/4th Tandem Stance with EC to HEP    Person(s) Educated Patient    Methods Explanation;Demonstration    Comprehension Verbalized understanding;Returned demonstration            PT Short Term Goals - 11/30/19 1215      PT SHORT TERM GOAL #1   Title Patient will be independent with initial HEP for balance/vestibular (All STGS due: 12/21/19)    Baseline VOR x 1 initiated    Time 3    Period Weeks    Status New    Target Date 12/21/19      PT SHORT TERM GOAL #2   Title Patient will improve FGA to >/= 24/30 to demonstrate improved balance and reduced fall risk    Baseline 22/30    Time 3    Period Weeks    Status New      PT SHORT TERM GOAL #3   Title Patient will demo ability to hold situation 4 of M-CSTIB for >/= 15 seconds to demonstrate improved balance    Baseline 7 secs    Time 3    Period Weeks    Status New             PT Long Term Goals - 11/30/19 1217      PT LONG TERM GOAL #1   Title Patient will be independent with final HEP for vestibular/balance (ALL LTGS due: 01/11/20)    Baseline VOR x 1 established    Time 6    Period Weeks    Status New    Target Date 01/11/20      PT LONG TERM GOAL #2   Title Patient will improve FGA to >/= 26/30 to demonstrate  improved balance and low fall risk    Baseline 22/30    Time 6    Period Weeks    Status New      PT LONG TERM GOAL #3   Title Patient will demo ability to ambulate > 100 ft  with horizontal head turns without LOB to demonstrate improved scanning environemnt with community ambulation    Baseline instability with horizontal head turns    Time 6    Period Weeks    Status New      PT LONG TERM GOAL #4   Title Patient will demo ability tol hold situation 4 of M-CSTIB for >/= 25 seconds to demo improved balance and vestibular function    Baseline 7 seconds    Time 6    Period Weeks    Status New      PT LONG TERM GOAL #5   Title Patient will improve DHI to </= 10 to demonstrate reduced dizziness and improved tolerance for tasks    Baseline 16/100    Time 6    Period Weeks    Status New                 Plan - 12/14/19 0946    Clinical Impression Statement Continued progression of balance activities on complaint surfaces as well as progressing with narrow BOS and vision removed as tolerated by patient. Patient continues to demo increased balance challenge with EC and often has increased sway or LOB to L side. Will continue to progress toward all goals.    Personal Factors and Comorbidities Comorbidity 3+;Time since onset of injury/illness/exacerbation    Comorbidities HLD, MVP, Sleep Apnea with CPAP, Tinnitus, Diverticulitis, History of Meniere's Disease    Examination-Activity Limitations Stand;Locomotion Level;Transfers;Bed Mobility;Lift    Examination-Participation Restrictions Yard Work;Community Activity    Stability/Clinical Decision Making Evolving/Moderate complexity    Rehab Potential Good    PT Frequency 1x / week    PT Duration 6 weeks    PT Treatment/Interventions ADLs/Self Care Home Management;Canalith Repostioning;Electrical Stimulation;Moist Heat;DME Instruction;Gait training;Stair training;Functional mobility training;Therapeutic activities;Therapeutic  exercise;Balance training;Neuromuscular re-education;Patient/family education;Manual techniques;Vestibular;Passive range of motion    PT Next Visit Plan Check Goals. Exercises targeting vestibular system. Walking with ball toss + visual tracking. Rockerboard, Balance Beam. Balance exercises with vision removed.    Consulted and Agree with Plan of Care Patient           Patient will benefit from skilled therapeutic intervention in order to improve the following deficits and impairments:  Decreased balance, Difficulty walking, Dizziness, Decreased activity tolerance, Abnormal gait  Visit Diagnosis: Dizziness and giddiness  Unsteadiness on feet  Difficulty in walking, not elsewhere classified     Problem List Patient Active Problem List   Diagnosis Date Noted  . Nicotine use disorder 11/09/2015  . HTN, goal below 130/80 11/09/2015  . Tobacco use disorder 03/31/2014  . OSA on CPAP 11/11/2013  . Hypertension 06/06/2010  . MVP (mitral valve prolapse)   . HLD (hyperlipidemia)   . Sleep apnea     Jones Bales, PT, DPT 12/14/2019, 9:47 AM  [facility] [address] [place], [place], [address] Phone: [phone]   Fax:  [phone]  Name: Michael [name] MRN: [id number] Date of Birth: 05-Aug-1950

## 2019-12-21 ENCOUNTER — Ambulatory Visit: Payer: Medicare Other

## 2019-12-21 ENCOUNTER — Other Ambulatory Visit: Payer: Self-pay

## 2019-12-21 DIAGNOSIS — R42 Dizziness and giddiness: Secondary | ICD-10-CM

## 2019-12-21 DIAGNOSIS — R2681 Unsteadiness on feet: Secondary | ICD-10-CM

## 2019-12-21 DIAGNOSIS — R262 Difficulty in walking, not elsewhere classified: Secondary | ICD-10-CM

## 2019-12-21 NOTE — Therapy (Signed)
[facility] [address] [place], [place], [address] Phone: [phone]   Fax:  [phone]  Physical Therapy Treatment  Patient Details  Name: Michael [name] MRN: [id number] Date of Birth: [birth year] Referring Provider (PT): Dr. Benjamine Mola   Encounter Date: 12/21/2019   PT End of Session - 12/21/19 0800    Visit Number 4    Number of Visits 7    Date for PT Re-Evaluation 01/29/20   POC for 6 weeks, Cert for 60 days   Authorization Type Medicare Part A + B    Progress Note Due on Visit 10    PT Start Time 0800    PT Stop Time 0844    PT Time Calculation (min) 44 min    Equipment Utilized During Treatment Gait belt    Activity Tolerance Patient tolerated treatment well    Behavior During Therapy [facility] for tasks assessed/performed           Past Medical History:  Diagnosis Date   Anal fissure    Anxiety    Convulsions ([place])    AS A CHILD   Diverticulitis    Heart murmur    Hiatal hernia with gastroesophageal reflux    HLD (hyperlipidemia)    MVP (mitral valve prolapse)    with moderate MR per echo in 2009   Normal cardiac stress test January 2014   OSA on CPAP 11/11/2013   Sleep apnea    wears CPAP   Tinnitus    Tobacco abuse    Tobacco use disorder 03/31/2014   White coat hypertension     Past Surgical History:  Procedure Laterality Date   APPENDECTOMY  2006   HERNIA REPAIR     ROTATOR CUFF REPAIR Left 2007    There were no vitals filed for this visit.   Subjective Assessment - 12/21/19 0801    Subjective Patient reports no changes since last visit. No falls since last visit to report. Continues to report HEP is going well.    Pertinent History HLD, MVP, Sleep Apnea with CPAP, Tinnitus, Diverticulitis, History of Meniere's Disease    Limitations Walking;Standing    Patient Stated Goals Eliminate the Dizziness    Currently in Pain? No/denies              [facility] PT Assessment -  12/21/19 0810      High Level Balance   High Level Balance Activities Other (comment)    High Level Balance Comments Completed M-CSTIB, patient able to hold siutation 1-3 for full 30 seconds, situatio 4: 27 seconds on average.       Functional Gait  Assessment   Gait assessed  Yes    Gait Level Surface Walks 20 ft in less than 7 sec but greater than 5.5 sec, uses assistive device, slower speed, mild gait deviations, or deviates 6-10 in outside of the 12 in walkway width.    Change in Gait Speed Able to smoothly change walking speed without loss of balance or gait deviation. Deviate no more than 6 in outside of the 12 in walkway width.    Gait with Horizontal Head Turns Performs head turns smoothly with slight change in gait velocity (eg, minor disruption to smooth gait path), deviates 6-10 in outside 12 in walkway width, or uses an assistive device.    Gait with Vertical Head Turns Performs task with slight change in gait velocity (eg, minor disruption to smooth gait path), deviates 6 - 10 in outside 12  in walkway width or uses assistive device    Gait and Pivot Turn Pivot turns safely within 3 sec and stops quickly with no loss of balance.    Step Over Obstacle Is able to step over 2 stacked shoe boxes taped together (9 in total height) without changing gait speed. No evidence of imbalance.    Gait with Narrow Base of Support Ambulates 7-9 steps.    Gait with Eyes Closed Walks 20 ft, slow speed, abnormal gait pattern, evidence for imbalance, deviates 10-15 in outside 12 in walkway width. Requires more than 9 sec to ambulate 20 ft.    Ambulating Backwards Walks 20 ft, no assistive devices, good speed, no evidence for imbalance, normal gait    Steps Alternating feet, no rail.    Total Score 24    FGA comment: 24/30 = Medium Fall Risk                         [facility] Adult PT Treatment/Exercise - 12/21/19 0001      Ambulation/Gait   Ambulation/Gait Yes    Ambulation/Gait  Assistance 5: Supervision    Ambulation/Gait Assistance Details througohut therapy with high level balance     Assistive device None    Gait Pattern Step-through pattern    Ambulation Surface Level;Indoor      High Level Balance   High Level Balance Activities Other (comment);Head turns    High Level Balance Comments Completed ambulation with visual tracking, including diagonal 2 x 45', CW/CCW 2 x 45' each, increased challenge with CW > CCW. Complete dambulation with ball toss and visual tracking, 3 x 45' feet. Completed ambulation with horizontal/vertical head turns 1 x 60' each, continue to demo icnreased balance challenge with horizontal > vertical.                Balance Exercises - 12/21/19 0823      Balance Exercises: Standing   Standing Eyes Opened Wide (BOA);Head turns;Foam/compliant surface;Limitations    Standing Eyes Opened Limitations completed standding with wide BOS completed horiozntal/vertical head turns 1 x 15 reps with EO.     Standing Eyes Closed Wide (BOA);Head turns;Foam/compliant surface;Limitations    Standing Eyes Closed Limitations progressing to completing EC 1 x 10 reps each. Increased challenge with head turns with vision removed    Tandem Stance Eyes open;Eyes closed;Intermittent upper extremity support;Limitations    Tandem Stance Time completed stance with 30 secs each foot with EO; progresed to addition of horizontal/vertical head turns x 10 reps each alternating foot position    Tandem Gait Forward;Foam/compliant surface;4 reps;Limitations    Tandem Gait Limitations in // bars completed x 1 lap down and back on firm surface, then progressed to completing down and back x 3 laps on blue mat. increased balance challenge with completion on blue mat               PT Short Term Goals - 12/21/19 0803      PT SHORT TERM GOAL #1   Title Patient will be independent with initial HEP for balance/vestibular (All STGS due: 12/21/19)    Baseline Patient  reports independence    Time 3    Period Weeks    Status Achieved    Target Date 12/21/19      PT SHORT TERM GOAL #2   Title Patient will improve FGA to >/= 24/30 to demonstrate improved balance and reduced fall risk    Baseline 22/30, 24/30  Time 3    Period Weeks    Status Achieved      PT SHORT TERM GOAL #3   Title Patient will demo ability to hold situation 4 of M-CSTIB for >/= 15 seconds to demonstrate improved balance    Baseline 7 secs, avg. 27 secs    Time 3    Period Weeks    Status Achieved             PT Long Term Goals - 11/30/19 1217      PT LONG TERM GOAL #1   Title Patient will be independent with final HEP for vestibular/balance (ALL LTGS due: 01/11/20)    Baseline VOR x 1 established    Time 6    Period Weeks    Status New    Target Date 01/11/20      PT LONG TERM GOAL #2   Title Patient will improve FGA to >/= 26/30 to demonstrate improved balance and low fall risk    Baseline 22/30    Time 6    Period Weeks    Status New      PT LONG TERM GOAL #3   Title Patient will demo ability to ambulate > 100 ft with horizontal head turns without LOB to demonstrate improved scanning environemnt with community ambulation    Baseline instability with horizontal head turns    Time 6    Period Weeks    Status New      PT LONG TERM GOAL #4   Title Patient will demo ability tol hold situation 4 of M-CSTIB for >/= 25 seconds to demo improved balance and vestibular function    Baseline 7 seconds    Time 6    Period Weeks    Status New      PT LONG TERM GOAL #5   Title Patient will improve DHI to </= 10 to demonstrate reduced dizziness and improved tolerance for tasks    Baseline 16/100    Time 6    Period Weeks    Status New                 Plan - 12/21/19 1152    Clinical Impression Statement Todays skilled PT session included assessment of patients progress toward STGs. Patient able to meet STG #1, 2 and 3 during todays session. Patient  scored 24/30 on FGA demonstrating improved balance and reduced fall risk. Patient also able to hold situation 4 of M-CTSIB for 27 secs demonstrating improved vestibular input with balance. Continued rest of session on high level balance exercises as tolerated by patient. Patient will continue to benefit from skilled PT services to continue to progress toward LTG and further reduce risk for falls.    Personal Factors and Comorbidities Comorbidity 3+;Time since onset of injury/illness/exacerbation    Comorbidities HLD, MVP, Sleep Apnea with CPAP, Tinnitus, Diverticulitis, History of Meniere's Disease    Examination-Activity Limitations Stand;Locomotion Level;Transfers;Bed Mobility;Lift    Examination-Participation Restrictions Yard Work;Community Activity    Stability/Clinical Decision Making Evolving/Moderate complexity    Rehab Potential Good    PT Frequency 1x / week    PT Duration 6 weeks    PT Treatment/Interventions ADLs/Self Care Home Management;Canalith Repostioning;Electrical Stimulation;Moist Heat;DME Instruction;Gait training;Stair training;Functional mobility training;Therapeutic activities;Therapeutic exercise;Balance training;Neuromuscular re-education;Patient/family education;Manual techniques;Vestibular;Passive range of motion    PT Next Visit Plan Exercises targeting vestibular system. Walking with ball toss + visual tracking. Rockerboard, Balance Beam. Balance exercises with vision removed.    Consulted and Agree with  Plan of Care Patient           Patient will benefit from skilled therapeutic intervention in order to improve the following deficits and impairments:  Decreased balance, Difficulty walking, Dizziness, Decreased activity tolerance, Abnormal gait  Visit Diagnosis: Dizziness and giddiness  Unsteadiness on feet  Difficulty in walking, not elsewhere classified     Problem List Patient Active Problem List   Diagnosis Date Noted   Nicotine use disorder  11/09/2015   HTN, goal below 130/80 11/09/2015   Tobacco use disorder 03/31/2014   OSA on CPAP 11/11/2013   Hypertension 06/06/2010   MVP (mitral valve prolapse)    HLD (hyperlipidemia)    Sleep apnea     Jones Bales, PT, DPT 12/21/2019, 11:52 AM  [facility] [address] [place], [place], [address] Phone: [phone]   Fax:  [phone]  Name: Michael [name] MRN: [id number] Date of Birth: 12-13-1950

## 2019-12-28 ENCOUNTER — Other Ambulatory Visit: Payer: Self-pay

## 2019-12-28 ENCOUNTER — Ambulatory Visit: Payer: Medicare Other

## 2019-12-28 DIAGNOSIS — R42 Dizziness and giddiness: Secondary | ICD-10-CM

## 2019-12-28 DIAGNOSIS — R2681 Unsteadiness on feet: Secondary | ICD-10-CM | POA: Diagnosis not present

## 2019-12-28 DIAGNOSIS — R262 Difficulty in walking, not elsewhere classified: Secondary | ICD-10-CM

## 2019-12-28 NOTE — Patient Instructions (Signed)
Gaze Stabilization: Standing Feet Together (Compliant Surface)    Feet together on pillow, keeping eyes on target on wall 3-4  feet away, tilt head down 15-30 and move head side to side for 60 seconds. Repeat while moving head up and down for 60 seconds. Do 2-3 sessions per day.  Copyright  VHI. All rights reserved.

## 2019-12-28 NOTE — Therapy (Signed)
[facility] [address] [place], [place], [address] Phone: [phone]   Fax:  [phone]  Physical Therapy Treatment  Patient Details  Name: Michael [name] MRN: [id number] Date of Birth: January 27, 1951 Referring Provider (PT): Dr. Benjamine Mola   Encounter Date: 12/28/2019   PT End of Session - 12/28/19 0804    Visit Number 5    Number of Visits 7    Date for PT Re-Evaluation 01/29/20   POC for 6 weeks, Cert for 60 days   Authorization Type Medicare Part A + B    Progress Note Due on Visit 10    PT Start Time 0802    PT Stop Time 0845    PT Time Calculation (min) 43 min    Equipment Utilized During Treatment Gait belt    Activity Tolerance Patient tolerated treatment well    Behavior During Therapy [facility] for tasks assessed/performed           Past Medical History:  Diagnosis Date  . Anal fissure   . Anxiety   . Convulsions ([place])    AS A CHILD  . Diverticulitis   . Heart murmur   . Hiatal hernia with gastroesophageal reflux   . HLD (hyperlipidemia)   . MVP (mitral valve prolapse)    with moderate MR per echo in 2009  . Normal cardiac stress test January 2014  . OSA on CPAP 11/11/2013  . Sleep apnea    wears CPAP  . Tinnitus   . Tobacco abuse   . Tobacco use disorder 03/31/2014  . White coat hypertension     Past Surgical History:  Procedure Laterality Date  . APPENDECTOMY  2006  . HERNIA REPAIR    . ROTATOR CUFF REPAIR Left 2007    There were no vitals filed for this visit.   Subjective Assessment - 12/28/19 0804    Subjective Patient reports no new complaints/changes since last visit. No pain.    Pertinent History HLD, MVP, Sleep Apnea with CPAP, Tinnitus, Diverticulitis, History of Meniere's Disease    Limitations Walking;Standing    Patient Stated Goals Eliminate the Dizziness    Currently in Pain? No/denies                             [facility] Adult PT Treatment/Exercise -  12/28/19 0001      Transfers   Transfers Sit to Stand;Stand to Sit    Sit to Stand 5: Supervision    Stand to Sit 5: Supervision    Number of Reps 10 reps;Other sets (comment)    Comments Completed 1 x 10 reps on airex without UE support, progressed to completed x 10 reps on red balance beam. Increased balance challenge on red balance beam.       Ambulation/Gait   Ambulation/Gait Yes    Ambulation/Gait Assistance 5: Supervision    Ambulation/Gait Assistance Details with high level balance    Ambulation Distance (Feet) --   230 x 2    Assistive device None    Gait Pattern Step-through pattern    Ambulation Surface Level;Indoor      High Level Balance   High Level Balance Activities Head turns    High Level Balance Comments Completed horizontal and vertical head turns with gait, 2 x 115' each. increased sway noted with horizontal > vertical            Vestibular Treatment/Exercise - 12/28/19 0001  Vestibular Treatment/Exercise   Vestibular Treatment Provided Gaze    Gaze Exercises X1 Viewing Horizontal;X1 Viewing Vertical      X1 Viewing Horizontal   Foot Position standing feet together (firm), standing feet together on complaint surfaces    Reps 3    Comments 1 x 60 secs on firm, 2 x 60 secs on foam. increased balance challenge on complaint surface      X1 Viewing Vertical   Foot Position standing feet together (firm), standing feet together on complaint surfaces    Reps 3    Comments 1 x 60 secs on firm, 2 x 60 secs on foam.               Balance Exercises - 12/28/19 0001      Balance Exercises: Standing   Standing Eyes Opened Head turns;Foam/compliant surface;Narrow base of support (BOS);Limitations    Standing Eyes Opened Limitations completed head turns with narrow BOS on complaint surface with EO 1 x 10 reps of horizontal/vertical    Standing Eyes Closed Wide (BOA);Narrow base of support (BOS);Head turns;Foam/compliant surface;Limitations    Standing Eyes  Closed Limitations completed initially with wide BOS 1 x10 reps of horizontal/vertical ehad turns, progressed to narrow BOS and completed 1 x 10 reps of horizontal/vertical. Increased CGA required with narrow BOS and vision removed    Tandem Stance Eyes open;Eyes closed;Foam/compliant surface;3 reps;Limitations    Tandem Stance Time completed 1 x 30 secs with EO, progressed to EC 2 x 30 seconds each on complaint surface,     Tandem Gait Forward;Retro;Foam/compliant surface;Limitations    Tandem Gait Limitations completed x 3 laps down and back on complaint surfaces. Completed retro tandem gait x 2 laps down and back on firm surface, completed forward tandem x 2 laps down and back on firm with horizotnal head turns.              PT Education - 12/28/19 0812    Education Details VOR x 1 Progression (Complaint Surfaces)    Person(s) Educated Patient    Methods Explanation;Demonstration;Handout    Comprehension Verbalized understanding;Returned demonstration            PT Short Term Goals - 12/21/19 0803      PT SHORT TERM GOAL #1   Title Patient will be independent with initial HEP for balance/vestibular (All STGS due: 12/21/19)    Baseline Patient reports independence    Time 3    Period Weeks    Status Achieved    Target Date 12/21/19      PT SHORT TERM GOAL #2   Title Patient will improve FGA to >/= 24/30 to demonstrate improved balance and reduced fall risk    Baseline 22/30, 24/30    Time 3    Period Weeks    Status Achieved      PT SHORT TERM GOAL #3   Title Patient will demo ability to hold situation 4 of M-CSTIB for >/= 15 seconds to demonstrate improved balance    Baseline 7 secs, avg. 27 secs    Time 3    Period Weeks    Status Achieved             PT Long Term Goals - 11/30/19 1217      PT LONG TERM GOAL #1   Title Patient will be independent with final HEP for vestibular/balance (ALL LTGS due: 01/11/20)    Baseline VOR x 1 established    Time 6     Period  Weeks    Status New    Target Date 01/11/20      PT LONG TERM GOAL #2   Title Patient will improve FGA to >/= 26/30 to demonstrate improved balance and low fall risk    Baseline 22/30    Time 6    Period Weeks    Status New      PT LONG TERM GOAL #3   Title Patient will demo ability to ambulate > 100 ft with horizontal head turns without LOB to demonstrate improved scanning environemnt with community ambulation    Baseline instability with horizontal head turns    Time 6    Period Weeks    Status New      PT LONG TERM GOAL #4   Title Patient will demo ability tol hold situation 4 of M-CSTIB for >/= 25 seconds to demo improved balance and vestibular function    Baseline 7 seconds    Time 6    Period Weeks    Status New      PT LONG TERM GOAL #5   Title Patient will improve DHI to </= 10 to demonstrate reduced dizziness and improved tolerance for tasks    Baseline 16/100    Time 6    Period Weeks    Status New                 Plan - 12/28/19 0847    Clinical Impression Statement Today's skilled PT session included focus on progression of balance exercises as tolerated by patient. Patient continues to demon increased balance challenge with vision removed on complaint surfaces and horizontal head turns > vertical. Continue to remain CGA with high level balance at this time. Progressed VOR x 1 to complaint surfaces and narrow BOS, patient tolerating well. Will continue per POC.    Personal Factors and Comorbidities Comorbidity 3+;Time since onset of injury/illness/exacerbation    Comorbidities HLD, MVP, Sleep Apnea with CPAP, Tinnitus, Diverticulitis, History of Meniere's Disease    Examination-Activity Limitations Stand;Locomotion Level;Transfers;Bed Mobility;Lift    Examination-Participation Restrictions Yard Work;Community Activity    Stability/Clinical Decision Making Evolving/Moderate complexity    Rehab Potential Good    PT Frequency 1x / week    PT Duration  6 weeks    PT Treatment/Interventions ADLs/Self Care Home Management;Canalith Repostioning;Electrical Stimulation;Moist Heat;DME Instruction;Gait training;Stair training;Functional mobility training;Therapeutic activities;Therapeutic exercise;Balance training;Neuromuscular re-education;Patient/family education;Manual techniques;Vestibular;Passive range of motion    PT Next Visit Plan How was VOR x 1 progression? Exercises targeting vestibular system. Walking with ball toss + visual tracking. Rockerboard, Balance Beam. Balance exercises with vision removed.    Consulted and Agree with Plan of Care Patient           Patient will benefit from skilled therapeutic intervention in order to improve the following deficits and impairments:  Decreased balance, Difficulty walking, Dizziness, Decreased activity tolerance, Abnormal gait  Visit Diagnosis: Dizziness and giddiness  Unsteadiness on feet  Difficulty in walking, not elsewhere classified     Problem List Patient Active Problem List   Diagnosis Date Noted  . Nicotine use disorder 11/09/2015  . HTN, goal below 130/80 11/09/2015  . Tobacco use disorder 03/31/2014  . OSA on CPAP 11/11/2013  . Hypertension 06/06/2010  . MVP (mitral valve prolapse)   . HLD (hyperlipidemia)   . Sleep apnea     Jones Bales, PT, DPT 12/28/2019, 8:49 AM  [facility] [address] [place], [place], [address] Phone: [phone]  Fax:  [phone]  Name: Michael [name] MRN: [id number] Date of Birth: August 27, 1950

## 2019-12-31 ENCOUNTER — Telehealth: Payer: Self-pay

## 2019-12-31 NOTE — Telephone Encounter (Signed)
LVM for pt to call me back to schedule sleep study  

## 2020-01-04 ENCOUNTER — Ambulatory Visit: Payer: Medicare Other

## 2020-01-04 ENCOUNTER — Other Ambulatory Visit: Payer: Self-pay

## 2020-01-04 DIAGNOSIS — R42 Dizziness and giddiness: Secondary | ICD-10-CM | POA: Diagnosis not present

## 2020-01-04 DIAGNOSIS — R2681 Unsteadiness on feet: Secondary | ICD-10-CM | POA: Diagnosis not present

## 2020-01-04 DIAGNOSIS — R262 Difficulty in walking, not elsewhere classified: Secondary | ICD-10-CM | POA: Diagnosis not present

## 2020-01-04 NOTE — Patient Instructions (Signed)
Access Code: [email or web address] URL: [email or web address] Date: 01/04/2020 Prepared by: Baldomero Lamy  Exercises Tandem Walking with Counter Support - 1 x daily - 5 x weekly - 3 sets - 10 reps Backward Tandem Walking with Counter Support - 1 x daily - 5 x weekly - 3 sets - 10 reps Romberg Stance Eyes Closed on Foam Pad - 1 x daily - 5 x weekly - 1 sets - 3 reps - 30 hold Romberg Stance on Foam Pad with Head Rotation - 1 x daily - 5 x weekly - 2 sets - 10 reps Romberg Stance with Head Nods on Foam Pad - 1 x daily - 5 x weekly - 2 sets - 10 reps Tandem Stance with Eyes Closed in Corner - 1 x daily - 5 x weekly - 1 sets - 3 reps - 30 hold Walking with Head Rotation - 1 x daily - 5 x weekly - 2 sets - 10 reps Backwards Walking - 1 x daily - 5 x weekly - 3 sets - 10 reps

## 2020-01-04 NOTE — Therapy (Signed)
[facility] [address] [place], [place], [address] Phone: [phone]   Fax:  [phone]  Physical Therapy Treatment  Patient Details  Name: Michael [name] MRN: [id number] Date of Birth: [birth year] Referring Provider (PT): Dr. Benjamine Mola   Encounter Date: 01/04/2020   PT End of Session - 01/04/20 0803    Visit Number 6    Number of Visits 7    Date for PT Re-Evaluation 01/29/20   POC for 6 weeks, Cert for 60 days   Authorization Type Medicare Part A + B    Progress Note Due on Visit 10    PT Start Time 0803    PT Stop Time 0845    PT Time Calculation (min) 42 min    Equipment Utilized During Treatment Gait belt    Activity Tolerance Patient tolerated treatment well    Behavior During Therapy [facility] for tasks assessed/performed           Past Medical History:  Diagnosis Date  . Anal fissure   . Anxiety   . Convulsions ([place])    AS A CHILD  . Diverticulitis   . Heart murmur   . Hiatal hernia with gastroesophageal reflux   . HLD (hyperlipidemia)   . MVP (mitral valve prolapse)    with moderate MR per echo in 2009  . Normal cardiac stress test January 2014  . OSA on CPAP 11/11/2013  . Sleep apnea    wears CPAP  . Tinnitus   . Tobacco abuse   . Tobacco use disorder 03/31/2014  . White coat hypertension     Past Surgical History:  Procedure Laterality Date  . APPENDECTOMY  2006  . HERNIA REPAIR    . ROTATOR CUFF REPAIR Left 2007    There were no vitals filed for this visit.   Subjective Assessment - 01/04/20 0804    Subjective Patient reports that the balance has been feeling better. No falls. No pain.    Pertinent History HLD, MVP, Sleep Apnea with CPAP, Tinnitus, Diverticulitis, History of Meniere's Disease    Limitations Walking;Standing    Patient Stated Goals Eliminate the Dizziness    Currently in Pain? No/denies             [facility] Adult PT Treatment/Exercise - 01/04/20 0001       Ambulation/Gait   Ambulation/Gait Yes    Ambulation/Gait Assistance 5: Supervision    Ambulation/Gait Assistance Details with high level balance activites     Assistive device None    Gait Pattern Step-through pattern    Ambulation Surface Level;Indoor      High Level Balance   High Level Balance Activities Head turns    High Level Balance Comments Ambulation with horizontal head turns x 200 ft.             Balance Exercises - 01/04/20 0001      Balance Exercises: Standing   Tandem Stance Eyes open;Limitations    Tandem Stance Time completed partial tandem stance on incline, completing horizontal/vertical head turns 1 x 10 reps. atlernating foot position. CGA required     Rockerboard Anterior/posterior;Lateral;EO;Head turns;EC    Rockerboard Limitations standing on rockerboard positioned A/P completed stance with EC 2 x 30 seconds,  Progressed to completing horizontal/vertical head turns 1 x 15 reps. With board positioned laterally, EC 3 x 30 secs. Then completed horizontal/vertical head turns 1 x 15 reps each. increased challenge with vision removed and EC    Retro Gait 3 reps;Limitations  Retro Gait Limitations completed x 3 laps backwards walking, progressed to completing x 3 reps with additional horizontal head turns.     Other Standing Exercises completed standing on airex pad with feet apart completing forward steps over orange hurdle x 10 reps bilaterally. intermittent UE support required during completion. On upward incline completing marching with eyes open, progressing to marching with eyes closed x 2 reps. increased balance challenge with vision removed.           Completed all of the following exercises as review of current HEP and progressed as tolerated by patient. Patient demo improved balance and tolerating progression of tandem stance, tandem walking, and backwards walking.   Access Code: [email or web address] URL: [email or web address] Date:  01/04/2020 Prepared by: Baldomero Lamy  Exercises Tandem Walking with Counter Support - 1 x daily - 5 x weekly - 3 sets - 10 reps Backward Tandem Walking with Counter Support - 1 x daily - 5 x weekly - 3 sets - 10 reps Romberg Stance Eyes Closed on Foam Pad - 1 x daily - 5 x weekly - 1 sets - 3 reps - 30 hold Romberg Stance on Foam Pad with Head Rotation - 1 x daily - 5 x weekly - 2 sets - 10 reps Romberg Stance with Head Nods on Foam Pad - 1 x daily - 5 x weekly - 2 sets - 10 reps Tandem Stance with Eyes Closed in Corner - 1 x daily - 5 x weekly - 1 sets - 3 reps - 30 hold Walking with Head Rotation - 1 x daily - 5 x weekly - 2 sets - 10 reps Backwards Walking - 1 x daily - 5 x weekly - 3 sets - 10 reps    PT Education - 01/04/20 0844    Education Details HEP Update    Person(s) Educated Patient    Methods Explanation;Demonstration;Handout    Comprehension Verbalized understanding;Returned demonstration            PT Short Term Goals - 12/21/19 0803      PT SHORT TERM GOAL #1   Title Patient will be independent with initial HEP for balance/vestibular (All STGS due: 12/21/19)    Baseline Patient reports independence    Time 3    Period Weeks    Status Achieved    Target Date 12/21/19      PT SHORT TERM GOAL #2   Title Patient will improve FGA to >/= 24/30 to demonstrate improved balance and reduced fall risk    Baseline 22/30, 24/30    Time 3    Period Weeks    Status Achieved      PT SHORT TERM GOAL #3   Title Patient will demo ability to hold situation 4 of M-CSTIB for >/= 15 seconds to demonstrate improved balance    Baseline 7 secs, avg. 27 secs    Time 3    Period Weeks    Status Achieved             PT Long Term Goals - 11/30/19 1217      PT LONG TERM GOAL #1   Title Patient will be independent with final HEP for vestibular/balance (ALL LTGS due: 01/11/20)    Baseline VOR x 1 established    Time 6    Period Weeks    Status New    Target Date 01/11/20       PT LONG TERM GOAL #2   Title Patient will improve FGA to >/=  26/30 to demonstrate improved balance and low fall risk    Baseline 22/30    Time 6    Period Weeks    Status New      PT LONG TERM GOAL #3   Title Patient will demo ability to ambulate > 100 ft with horizontal head turns without LOB to demonstrate improved scanning environemnt with community ambulation    Baseline instability with horizontal head turns    Time 6    Period Weeks    Status New      PT LONG TERM GOAL #4   Title Patient will demo ability tol hold situation 4 of M-CSTIB for >/= 25 seconds to demo improved balance and vestibular function    Baseline 7 seconds    Time 6    Period Weeks    Status New      PT LONG TERM GOAL #5   Title Patient will improve DHI to </= 10 to demonstrate reduced dizziness and improved tolerance for tasks    Baseline 16/100    Time 6    Period Weeks    Status New                 Plan - 01/04/20 1028    Clinical Impression Statement Reviewed current HEP and progressed as tolerated by patient, patient demo improvemetns with tandem stance and progressed to eyes closed today. Continued high level balance activities throughout session. Patient continue to have increased challenge with horizontal head turns, and vision removed on complaint surfaces. Will continue per POC.    Personal Factors and Comorbidities Comorbidity 3+;Time since onset of injury/illness/exacerbation    Comorbidities HLD, MVP, Sleep Apnea with CPAP, Tinnitus, Diverticulitis, History of Meniere's Disease    Examination-Activity Limitations Stand;Locomotion Level;Transfers;Bed Mobility;Lift    Examination-Participation Restrictions Yard Work;Community Activity    Stability/Clinical Decision Making Evolving/Moderate complexity    Rehab Potential Good    PT Frequency 1x / week    PT Duration 6 weeks    PT Treatment/Interventions ADLs/Self Care Home Management;Canalith Repostioning;Electrical  Stimulation;Moist Heat;DME Instruction;Gait training;Stair training;Functional mobility training;Therapeutic activities;Therapeutic exercise;Balance training;Neuromuscular re-education;Patient/family education;Manual techniques;Vestibular;Passive range of motion    PT Next Visit Plan Check Goals + Discharge    Consulted and Agree with Plan of Care Patient           Patient will benefit from skilled therapeutic intervention in order to improve the following deficits and impairments:  Decreased balance, Difficulty walking, Dizziness, Decreased activity tolerance, Abnormal gait  Visit Diagnosis: Dizziness and giddiness  Unsteadiness on feet  Difficulty in walking, not elsewhere classified     Problem List Patient Active Problem List   Diagnosis Date Noted  . Nicotine use disorder 11/09/2015  . HTN, goal below 130/80 11/09/2015  . Tobacco use disorder 03/31/2014  . OSA on CPAP 11/11/2013  . Hypertension 06/06/2010  . MVP (mitral valve prolapse)   . HLD (hyperlipidemia)   . Sleep apnea     Jones Bales, PT, DPT 01/04/2020, 10:31 AM  [facility] [address] [place], [place], [address] Phone: [phone]   Fax:  [phone]  Name: Michael [name] MRN: [id number] Date of Birth: 07/05/1950

## 2020-01-11 ENCOUNTER — Ambulatory Visit: Payer: Medicare Other

## 2020-01-11 ENCOUNTER — Other Ambulatory Visit: Payer: Self-pay

## 2020-01-11 DIAGNOSIS — R42 Dizziness and giddiness: Secondary | ICD-10-CM | POA: Diagnosis not present

## 2020-01-11 DIAGNOSIS — R262 Difficulty in walking, not elsewhere classified: Secondary | ICD-10-CM

## 2020-01-11 DIAGNOSIS — R2681 Unsteadiness on feet: Secondary | ICD-10-CM | POA: Diagnosis not present

## 2020-01-11 NOTE — Therapy (Signed)
[facility] [address] [place], [place], [address] Phone: [phone]   Fax:  [phone]  Physical Therapy Treatment/Discharge Summary  Patient Details  Name: Michael [name] MRN: [id number] Date of Birth: [birth year] Referring Provider (PT): Dr. Benjamine Mola  PHYSICAL THERAPY DISCHARGE SUMMARY  Visits from Start of Care: 7  Current functional level related to goals / functional outcomes: See Clinical Impression Statement for Details   Remaining deficits: Low Fall Risk   Education / Equipment: Educated on [facility]: Patient agrees to discharge.  Patient goals were met. Patient is being discharged due to meeting the stated rehab goals.  ?????       Encounter Date: 01/11/2020   PT End of Session - 01/11/20 0805    Visit Number 7    Number of Visits 7    Date for PT Re-Evaluation 01/29/20   POC for 6 weeks, Cert for 60 days   Authorization Type Medicare Part A + B    Progress Note Due on Visit 10    PT Start Time 0802    PT Stop Time 0835    PT Time Calculation (min) 33 min    Equipment Utilized During Treatment Gait belt    Activity Tolerance Patient tolerated treatment well    Behavior During Therapy WFL for tasks assessed/performed           Past Medical History:  Diagnosis Date  . Anal fissure   . Anxiety   . Convulsions ([place])    AS A CHILD  . Diverticulitis   . Heart murmur   . Hiatal hernia with gastroesophageal reflux   . HLD (hyperlipidemia)   . MVP (mitral valve prolapse)    with moderate MR per echo in 2009  . Normal cardiac stress test January 2014  . OSA on CPAP 11/11/2013  . Sleep apnea    wears CPAP  . Tinnitus   . Tobacco abuse   . Tobacco use disorder 03/31/2014  . White coat hypertension     Past Surgical History:  Procedure Laterality Date  . APPENDECTOMY  2006  . HERNIA REPAIR    . ROTATOR CUFF REPAIR Left 2007    There were no vitals filed for this  visit.   Subjective Assessment - 01/11/20 0804    Subjective Patient reports no new changes. Doing well. No falls.    Pertinent History HLD, MVP, Sleep Apnea with CPAP, Tinnitus, Diverticulitis, History of Meniere's Disease    Limitations Walking;Standing    Patient Stated Goals Eliminate the Dizziness    Currently in Pain? No/denies              [facility] PT Assessment - 01/11/20 0806      Observation/Other Assessments   Focus on Therapeutic Outcomes (FOTO)  93/100    Other Surveys  Dizziness Handicap Inventory [facility])    Dizziness Handicap Inventory [facility])  8/100      Functional Gait  Assessment   Gait assessed  Yes    Gait Level Surface Walks 20 ft in less than 7 sec but greater than 5.5 sec, uses assistive device, slower speed, mild gait deviations, or deviates 6-10 in outside of the 12 in walkway width.    Change in Gait Speed Able to smoothly change walking speed without loss of balance or gait deviation. Deviate no more than 6 in outside of the 12 in walkway width.    Gait with Horizontal Head Turns Performs head turns smoothly with no  change in gait. Deviates no more than 6 in outside 12 in walkway width    Gait with Vertical Head Turns Performs head turns with no change in gait. Deviates no more than 6 in outside 12 in walkway width.    Gait and Pivot Turn Pivot turns safely within 3 sec and stops quickly with no loss of balance.    Step Over Obstacle Is able to step over 2 stacked shoe boxes taped together (9 in total height) without changing gait speed. No evidence of imbalance.    Gait with Narrow Base of Support Is able to ambulate for 10 steps heel to toe with no staggering.    Gait with Eyes Closed Walks 20 ft, slow speed, abnormal gait pattern, evidence for imbalance, deviates 10-15 in outside 12 in walkway width. Requires more than 9 sec to ambulate 20 ft.    Ambulating Backwards Walks 20 ft, no assistive devices, good speed, no evidence for imbalance, normal gait    Steps  Alternating feet, no rail.    Total Score 27    FGA comment: 27/30 = Low Fall Risk               [facility] Adult PT Treatment/Exercise - 01/11/20 0001      Ambulation/Gait   Ambulation/Gait Yes    Ambulation/Gait Assistance 6: Modified independent (Device/Increase time)    Ambulation/Gait Assistance Details Completed ambulation x 200 ft with horizontal head turns to demo scanning of enviroment. No instances of imbalance or LOB noted. Significant improvements noted.     Ambulation Distance (Feet) 200 Feet    Assistive device None    Gait Pattern Step-through pattern    Ambulation Surface Level;Indoor      Neuro Re-ed    Neuro Re-ed Details  Completed M-CTSIB: patient able to hold situation 1 -4 for full 30 seconds. Increased sway noted on situation 4 but no LOB or UE support required. Completed verbal review of current HEP and educating on progressions upon discharge.            Access Code: [email or web address] URL: [email or web address] Date: 01/04/2020 Prepared by: Baldomero Lamy  Exercises Tandem Walking with Counter Support - 1 x daily - 5 x weekly - 3 sets - 10 reps Backward Tandem Walking with Counter Support - 1 x daily - 5 x weekly - 3 sets - 10 reps Romberg Stance Eyes Closed on Foam Pad - 1 x daily - 5 x weekly - 1 sets - 3 reps - 30 hold Romberg Stance on Foam Pad with Head Rotation - 1 x daily - 5 x weekly - 2 sets - 10 reps Romberg Stance with Head Nods on Foam Pad - 1 x daily - 5 x weekly - 2 sets - 10 reps Tandem Stance with Eyes Closed in Corner - 1 x daily - 5 x weekly - 1 sets - 3 reps - 30 hold Walking with Head Rotation - 1 x daily - 5 x weekly - 2 sets - 10 reps Backwards Walking - 1 x daily - 5 x weekly - 3 sets - 10 reps       PT Education - 01/11/20 0846    Education Details HEP review; Progress toward LTGs    Person(s) Educated Patient    Methods Explanation    Comprehension Verbalized understanding            PT Short Term Goals -  12/21/19 0803      PT SHORT TERM GOAL #1  Title Patient will be independent with initial HEP for balance/vestibular (All STGS due: 12/21/19)    Baseline Patient reports independence    Time 3    Period Weeks    Status Achieved    Target Date 12/21/19      PT SHORT TERM GOAL #2   Title Patient will improve FGA to >/= 24/30 to demonstrate improved balance and reduced fall risk    Baseline 22/30, 24/30    Time 3    Period Weeks    Status Achieved      PT SHORT TERM GOAL #3   Title Patient will demo ability to hold situation 4 of M-CSTIB for >/= 15 seconds to demonstrate improved balance    Baseline 7 secs, avg. 27 secs    Time 3    Period Weeks    Status Achieved             PT Long Term Goals - 01/11/20 0815      PT LONG TERM GOAL #1   Title Patient will be independent with final HEP for vestibular/balance (ALL LTGS due: 01/11/20)    Baseline Independent with Vestibular/Balance HEP    Time 6    Period Weeks    Status Achieved      PT LONG TERM GOAL #2   Title Patient will improve FGA to >/= 26/30 to demonstrate improved balance and low fall risk    Baseline 22/30; 27/30    Time 6    Period Weeks    Status Achieved      PT LONG TERM GOAL #3   Title Patient will demo ability to ambulate > 100 ft with horizontal head turns without LOB to demonstrate improved scanning environemnt with community ambulation    Baseline 200 ft with horizontal head turns no imbalance noted    Time 6    Period Weeks    Status Achieved      PT LONG TERM GOAL #4   Title Patient will demo ability tol hold situation 4 of M-CSTIB for >/= 25 seconds to demo improved balance and vestibular function    Baseline 7 seconds; 30 seconds on 11/29    Time 6    Period Weeks    Status Achieved      PT LONG TERM GOAL #5   Title Patient will improve DHI to </= 10 to demonstrate reduced dizziness and improved tolerance for tasks    Baseline 16/100; 11/29 8/100    Time 6    Period Weeks    Status  Achieved                 Plan - 01/11/20 0847    Clinical Impression Statement Today's skilled PT session included assessment of patient's progress toward all LTGs. Patient able to meet all LTG today during session demonstrating significant improvements in balance and reduced fall risk with PT services. Patient scored 27/30 today on FGA demo low fall risk. PT educating on compliance of HEP to maintain gains achieved with PT services. PT stating readiness for discharge, with patient verbalizing agreement.    Personal Factors and Comorbidities Comorbidity 3+;Time since onset of injury/illness/exacerbation    Comorbidities HLD, MVP, Sleep Apnea with CPAP, Tinnitus, Diverticulitis, History of Meniere's Disease    Examination-Activity Limitations Stand;Locomotion Level;Transfers;Bed Mobility;Lift    Examination-Participation Restrictions Yard Work;Community Activity    Stability/Clinical Decision Making Evolving/Moderate complexity    Rehab Potential Good    PT Frequency 1x / week    PT  Duration 6 weeks    PT Treatment/Interventions ADLs/Self Care Home Management;Canalith Repostioning;Electrical Stimulation;Moist Heat;DME Instruction;Gait training;Stair training;Functional mobility training;Therapeutic activities;Therapeutic exercise;Balance training;Neuromuscular re-education;Patient/family education;Manual techniques;Vestibular;Passive range of motion    Consulted and Agree with Plan of Care Patient           Patient will benefit from skilled therapeutic intervention in order to improve the following deficits and impairments:  Decreased balance, Difficulty walking, Dizziness, Decreased activity tolerance, Abnormal gait  Visit Diagnosis: Dizziness and giddiness  Unsteadiness on feet  Difficulty in walking, not elsewhere classified     Problem List Patient Active Problem List   Diagnosis Date Noted  . Nicotine use disorder 11/09/2015  . HTN, goal below 130/80 11/09/2015  .  Tobacco use disorder 03/31/2014  . OSA on CPAP 11/11/2013  . Hypertension 06/06/2010  . MVP (mitral valve prolapse)   . HLD (hyperlipidemia)   . Sleep apnea     Jones Bales, PT, DPT 01/11/2020, 8:49 AM  [facility] [address] [place], [place], [address] Phone: [phone]   Fax:  [phone]  Name: Michael [name] MRN: [id number] Date of Birth: September 04, 1950

## 2020-01-20 ENCOUNTER — Ambulatory Visit (INDEPENDENT_AMBULATORY_CARE_PROVIDER_SITE_OTHER): Payer: Medicare Other | Admitting: Neurology

## 2020-01-20 DIAGNOSIS — G4733 Obstructive sleep apnea (adult) (pediatric): Secondary | ICD-10-CM | POA: Diagnosis not present

## 2020-01-20 DIAGNOSIS — E669 Obesity, unspecified: Secondary | ICD-10-CM

## 2020-01-20 DIAGNOSIS — Z9989 Dependence on other enabling machines and devices: Secondary | ICD-10-CM

## 2020-01-26 NOTE — Progress Notes (Signed)
   [facility]  HOME SLEEP TEST (Watch PAT)  STUDY DOWNLOAD DATE: 01/26/20  DOB: 1951/02/06  MRN: [id number]  ORDERING CLINICIAN: Ward Givens, NP- Larey Seat, MD   REFERRING CLINICIAN: [name], MD   CLINICAL INFORMATION/HISTORY:11-19-19: [name] is a 69 year old male with a history of obstructive sleep apnea on CPAP.  His download indicates that he uses machine nightly for compliance of 100%.  On average he uses his machine 10 hours and 6 minutes.  His residual AHI is 0.2/h on 8 cm of water pressure with EPR of 1cm.  Overall he reports that the CPAP continues to work well for him.  He would like to get a new machine if possible.  Epworth sleepiness score: Was 3/24 on CPAP.   BMI: 32.7 kg/m  FINDINGS:   Total Record Time (hours, min): 9 h 40 min  Total Sleep Time (hours, min):  8 h 57 min   Percent REM (%):    8.83 %   Calculated pAHI (per hour):  15.2       REM pAHI:    22.0     NREM pAHI: 14.5 Supine AHI: N/A   Oxygen Saturation (%) Mean: 94  Minimum oxygen saturation (%):        89   O2 Saturation Range (%): 89-98  O2Saturation (minutes) <=88%: 0 min  Pulse Mean (bpm):    42  Pulse Range (42-74)   IMPRESSION: This HST indicated the presence of mild- moderate OSA (obstructive sleep apnea) without associated hypoxemia, and with bradycardia.    RECOMMENDATION:  It is advised to continue CPAP and use an autotitration device with a setting of 5-12 cm water, 2 cm EPR , heated humidity and interface of patient's choice.    INTERPRETING PHYSICIAN:  Larey Seat, MD [facility] [address] [place], [place] [address] [phone]

## 2020-01-29 ENCOUNTER — Telehealth: Payer: Self-pay | Admitting: Neurology

## 2020-01-29 DIAGNOSIS — G4733 Obstructive sleep apnea (adult) (pediatric): Secondary | ICD-10-CM

## 2020-01-29 DIAGNOSIS — E669 Obesity, unspecified: Secondary | ICD-10-CM | POA: Insufficient documentation

## 2020-01-29 NOTE — Telephone Encounter (Signed)
[facility]  HOME SLEEP TEST (Watch PAT)  STUDY DOWNLOAD DATE: 01/26/20  DOB: 04/21/1950  MRN: [id number]  ORDERING CLINICIAN: Ward Givens, NP- Larey Seat, MD   REFERRING CLINICIAN: [name], MD   CLINICAL INFORMATION/HISTORY:11-19-19: [name] is a 69 year old male with a history of obstructive sleep apnea on CPAP.  His download indicates that he uses machine nightly for compliance of 100%.  On average he uses his machine 10 hours and 6 minutes.  His residual AHI is 0.2/h on 8 cm of water pressure with EPR of 1cm.  Overall he reports that the CPAP continues to work well for him.  He would like to get a new machine if possible.  Epworth sleepiness score: Was 3/24 on CPAP.   BMI: 32.7 kg/m  FINDINGS:   Total Record Time (hours, min): 9 h 40 min  Total Sleep Time (hours, min):  8 h 57 min   Percent REM (%):    8.83 %   Calculated pAHI (per hour):  15.2       REM pAHI:    22.0     NREM pAHI: 14.5 Supine AHI: N/A   Oxygen Saturation (%) Mean: 94  Minimum oxygen saturation (%):        89   O2 Saturation Range (%): 89-98  O2Saturation (minutes) <=88%: 0 min  Pulse Mean (bpm):    42  Pulse Range (42-74)   IMPRESSION: This HST indicated the presence of mild- moderate OSA (obstructive sleep apnea) without associated hypoxemia, and with bradycardia.    RECOMMENDATION:  It is advised to continue CPAP and use an autotitration device with a setting of 5-12 cm water, 2 cm EPR , heated humidity and interface of patient's choice.   Result will go directly to M.Clabe Seal, NP    INTERPRETING PHYSICIAN:  Larey Seat, MD [facility] [address], [place], [place] [address] [phone]

## 2020-01-29 NOTE — Procedures (Signed)
[facility]  HOME SLEEP TEST (Watch PAT)  STUDY DOWNLOAD DATE: 01/26/20  DOB: [birth year]  MRN: [id number]  ORDERING CLINICIAN: Ward Givens, NP- Larey Seat, MD   REFERRING CLINICIAN: [name], MD   CLINICAL INFORMATION/HISTORY:11-19-19: [name] is a 69 year old male with a history of obstructive sleep apnea on CPAP.  His download indicates that he uses machine nightly for compliance of 100%.  On average he uses his machine 10 hours and 6 minutes.  His residual AHI is 0.2/h on 8 cm of water pressure with EPR of 1cm.  Overall he reports that the CPAP continues to work well for him.  He would like to get a new machine if possible.  Epworth sleepiness score: Was 3/24 on CPAP.   BMI: 32.7 kg/m  FINDINGS:   Total Record Time (hours, min): 9 h 40 min  Total Sleep Time (hours, min):  8 h 57 min   Percent REM (%):    8.83 %   Calculated pAHI (per hour):  15.2       REM pAHI:    22.0     NREM pAHI: 14.5 Supine AHI: N/A   Oxygen Saturation (%) Mean: 94  Minimum oxygen saturation (%):        89   O2 Saturation Range (%): 89-98  O2Saturation (minutes) <=88%: 0 min  Pulse Mean (bpm):    42  Pulse Range (42-74)   IMPRESSION: This HST indicated the presence of mild- moderate OSA (obstructive sleep apnea) without associated hypoxemia, and with bradycardia.    RECOMMENDATION:  It is advised to continue CPAP and use an autotitration device with a setting of 5-12 cm water, 2 cm EPR , heated humidity and interface of patient's choice.    INTERPRETING PHYSICIAN:  Larey Seat, MD [facility] [address], [place], [place] [address] [phone]

## 2020-02-01 NOTE — Telephone Encounter (Signed)
I called pt. I advised pt that Dr. Brett Fairy reviewed their sleep study results and found that pt has sleep apnea. Dr. Brett Fairy recommends that pt starts auto CPAP. I reviewed PAP compliance expectations with the pt. Pt is agreeable to starting a CPAP. I advised pt that an order will be sent to a DME, [facility] and [facility])  will call the pt within about one week after they file with the pt's insurance. [facility]) will show the pt how to use the machine, fit for masks, and troubleshoot the CPAP if needed. A follow up appt will need to be made for insurance purposes with Dr. Brett Fairy or the NP within 31-90 days from getting the new machine. Pt verbalized understanding to call our office to schedule this apt. Pt verbalized understanding of results. Pt had no questions at this time but was encouraged to call back if questions arise. I have sent the order to [facility])  and have received confirmation that they have received the order.

## 2020-02-13 DIAGNOSIS — C801 Malignant (primary) neoplasm, unspecified: Secondary | ICD-10-CM

## 2020-02-13 HISTORY — DX: Malignant (primary) neoplasm, unspecified: C80.1

## 2020-06-21 ENCOUNTER — Ambulatory Visit: Payer: 59 | Attending: Internal Medicine

## 2020-06-21 ENCOUNTER — Other Ambulatory Visit (HOSPITAL_BASED_OUTPATIENT_CLINIC_OR_DEPARTMENT_OTHER): Payer: Self-pay

## 2020-06-21 ENCOUNTER — Other Ambulatory Visit: Payer: Self-pay

## 2020-06-21 DIAGNOSIS — Z23 Encounter for immunization: Secondary | ICD-10-CM

## 2020-06-21 MED ORDER — PFIZER-BIONT COVID-19 VAC-TRIS 30 MCG/0.3ML IM SUSP
INTRAMUSCULAR | 0 refills | Status: DC
  Filled 2020-06-21: qty 0.3, 1d supply, fill #0

## 2020-06-21 NOTE — Progress Notes (Signed)
   Covid-19 Vaccination Clinic  Name:  Michael [name]    MRN: [id number] DOB: Feb 20, 1950  06/21/2020  Michael [name] was observed post Covid-19 immunization for 15 minutes without incident. He was provided with Vaccine Information Sheet and instruction to access the V-Safe system.   Michael [name] was instructed to call 911 with any severe reactions post vaccine: Marland Kitchen Difficulty breathing  . Swelling of face and throat  . A fast heartbeat  . A bad rash all over body  . Dizziness and weakness   Immunizations Administered    Name Date Dose VIS Date Route   PFIZER Comrnaty(Gray TOP) Covid-19 Vaccine 06/21/2020 10:42 AM 0.3 mL 01/21/2020 Intramuscular   Manufacturer: [employer], [employer]   Lot: [id number]   NDC: [phone]

## 2020-07-04 NOTE — Progress Notes (Signed)
Electrophysiology Office Note:    Date:  07/05/2020   ID:  Michael [name], DOB 1950/06/21, MRN [id number]  PCP:  [name], MD  [facility] Cardiologist:  None  [facility] Electrophysiologist:  Vickie Epley, MD   Referring MD: [name], MD   Chief Complaint: Bradycardia  History of Present Illness:    Michael [name] is a 70 y.o. male who presents for an evaluation of bradycardia at the request of Dr. Joylene Draft. Their medical history includes mitral valve prolapse, obstructive sleep apnea, tobacco abuse, obesity.  The patient feels better after stopping his nebivolol.  He tells me when he was taking nebivolol his heart rates were in the 40s and now they have increased to the 50s.  No syncope or presyncope.  His blood pressures have remained in goal range off the nebivolol.  He also has a history of mitral valve prolapse.  In the past he was following with Dr. Acie Fredrickson and having annual echoes.  He thinks he was lost to follow-up back in 2014.  Past Medical History:  Diagnosis Date  . Anal fissure   . Anxiety   . Convulsions ([place])    AS A CHILD  . Diverticulitis   . Heart murmur   . Hiatal hernia with gastroesophageal reflux   . HLD (hyperlipidemia)   . MVP (mitral valve prolapse)    with moderate MR per echo in 2009  . Normal cardiac stress test January 2014  . OSA on CPAP 11/11/2013  . Sleep apnea    wears CPAP  . Tinnitus   . Tobacco abuse   . Tobacco use disorder 03/31/2014  . White coat hypertension     Past Surgical History:  Procedure Laterality Date  . APPENDECTOMY  2006  . HERNIA REPAIR    . ROTATOR CUFF REPAIR Left 2007    Current Medications: Current Meds  Medication Sig  . aspirin 81 MG tablet Take 81 mg by mouth daily.  . B Complex CAPS Take 1 capsule by mouth daily.  . cholecalciferol (VITAMIN D) 1000 UNITS tablet Take 1,000 Units by mouth daily.  Marland Kitchen ezetimibe (ZETIA) 10 MG tablet Take 10 mg by mouth daily.  Marland Kitchen FIBER COMPLETE PO Take 1  tablet by mouth daily.   . fluticasone (FLONASE) 50 MCG/ACT nasal spray two sprays per nostril daily prn  . loratadine (CLARITIN) 10 MG tablet Take 10 mg by mouth as needed for allergies.  Marland Kitchen losartan (COZAAR) 25 MG tablet Take 50 mg by mouth at bedtime.   . pantoprazole (PROTONIX) 40 MG tablet Take 40 mg by mouth daily.  . sertraline (ZOLOFT) 50 MG tablet Take 50 mg by mouth daily.  . simvastatin (ZOCOR) 20 MG tablet Take 20 mg by mouth at bedtime.  . tamsulosin (FLOMAX) 0.4 MG CAPS capsule Take 0.4 mg by mouth daily.  . [DISCONTINUED] COVID-19 mRNA Vac-TriS, Pfizer, (PFIZER-BIONT COVID-19 VAC-TRIS) SUSP injection Inject into the muscle.     Allergies:   Patient has no known allergies.   Social History   Socioeconomic History  . Marital status: Married    Spouse name: Not on file  . Number of children: 0  . Years of education: HS  . Highest education level: Not on file  Occupational History  . Not on file  Tobacco Use  . Smoking status: Heavy Tobacco Smoker    Packs/day: 0.50    Years: 40.00    Pack years: 20.00    Types: Cigarettes    Start  date: 04/12/1967    Last attempt to quit: 03/06/2012    Years since quitting: 8.3  . Smokeless tobacco: Never Used  Vaping Use  . Vaping Use: Never used  Substance and Sexual Activity  . Alcohol use: No    Alcohol/week: 0.0 standard drinks  . Drug use: No  . Sexual activity: Yes  Other Topics Concern  . Not on file  Social History Narrative   Patient is married, does not have children.   Patient is right handed.   Patient has high school education.   Patient does not drink caffeine.   Social Determinants of Health   Financial Resource Strain: Not on file  Food Insecurity: Not on file  Transportation Needs: Not on file  Physical Activity: Not on file  Stress: Not on file  Social Connections: Not on file     Family History: The patient's family history includes Cancer in his mother; Cancer (age of onset: 94) in an other  family member; Hypertension in an other family member; Sleep apnea in his brother.  ROS:   Please see the history of present illness.    All other systems reviewed and are negative.  EKGs/Labs/Other Studies Reviewed:    The following studies were reviewed today:  Outside records from Dr. Joylene Draft  EKG:  The ekg ordered today demonstrates sinus bradycardia.  Left axis deviation.  Recent Labs: No results found for requested labs within last 8760 hours.  Recent Lipid Panel No results found for: CHOL, TRIG, HDL, CHOLHDL, VLDL, LDLCALC, LDLDIRECT  Physical Exam:    VS:  BP 112/72   Pulse (!) 45   Ht 5\' 11"  (1.803 m)   Wt 231 lb (104.8 kg)   SpO2 98%   BMI 32.22 kg/m     Wt Readings from Last 3 Encounters:  07/05/20 231 lb (104.8 kg)  11/19/19 234 lb 9.6 oz (106.4 kg)  11/19/18 226 lb 3.2 oz (102.6 kg)    Manual check of his pulse 48.  With ambulation around the clinic it increased to 72 bpm.  GEN:  Well nourished, well developed in no acute distress HEENT: Normal NECK: No JVD; No carotid bruits LYMPHATICS: No lymphadenopathy CARDIAC: RRR, no murmurs, rubs, gallops RESPIRATORY:  Clear to auscultation without rales, wheezing or rhonchi  ABDOMEN: Soft, non-tender, non-distended MUSCULOSKELETAL:  No edema; No deformity  SKIN: Warm and dry NEUROLOGIC:  Alert and oriented x 3 PSYCHIATRIC:  Normal affect   ASSESSMENT:    1. Bradycardia   2. Mitral valve prolapse   3. Nonrheumatic mitral valve regurgitation    PLAN:    In order of problems listed above:  1. Sinus bradycardia Improved since stopping his nebivolol.  It augments appropriately with ambulation around the clinic (48->72).  At this point, I do not think there is an indication for permanent pacemaker implant.  No syncope or presyncope.  I would recommend annual follow-up.  I would recommend he not restart his nebivolol.  2.  Mitral valve prolapse/mild mitral regurgitation No signs of heart failure on today's  exam. Would like to check an echo since the last one we have is from 2014 to confirm no change in LV dimensions or function.  Follow-up in 1 year or sooner as needed.  Medication Adjustments/Labs and Tests Ordered: Current medicines are reviewed at length with the patient today.  Concerns regarding medicines are outlined above.  Orders Placed This Encounter  Procedures  . EKG 12-Lead  . ECHOCARDIOGRAM COMPLETE   No orders of  the defined types were placed in this encounter.    Signed, Hilton Cork. Quentin Ore, MD, [facility], [facility] 07/05/2020 9:08 AM    Electrophysiology [facility]

## 2020-07-05 ENCOUNTER — Encounter: Payer: Self-pay | Admitting: Cardiology

## 2020-07-05 ENCOUNTER — Ambulatory Visit: Payer: Medicare Other | Admitting: Cardiology

## 2020-07-05 ENCOUNTER — Other Ambulatory Visit: Payer: Self-pay

## 2020-07-05 VITALS — BP 112/72 | HR 45 | Ht 71.0 in | Wt 231.0 lb

## 2020-07-05 DIAGNOSIS — I340 Nonrheumatic mitral (valve) insufficiency: Secondary | ICD-10-CM

## 2020-07-05 DIAGNOSIS — I341 Nonrheumatic mitral (valve) prolapse: Secondary | ICD-10-CM

## 2020-07-05 DIAGNOSIS — R001 Bradycardia, unspecified: Secondary | ICD-10-CM

## 2020-07-05 NOTE — Patient Instructions (Addendum)
Medication Instructions:  Your physician recommends that you continue on your current medications as directed. Please refer to the Current Medication list given to you today.  Labwork: None ordered.  Testing/Procedures: Your physician has requested that you have an echocardiogram. Echocardiography is a painless test that uses sound waves to create images of your heart. It provides your doctor with information about the size and shape of your heart and how well your heart's chambers and valves are working. This procedure takes approximately one hour. There are no restrictions for this procedure.   Follow-Up: Your physician wants you to follow-up in: 12 months with Lars Mage, MD or one of the following Advanced Practice Providers on your designated Care Team:    Chanetta Marshall, NP  Tommye Standard, PA-C  Legrand Como "[place]" [place], [place]   You will receive a reminder letter in the mail two months in advance. If you don't receive a letter, please call our office to schedule the follow-up appointment.   Any Other Special Instructions Will Be Listed Below (If Applicable).  If you need a refill on your cardiac medications before your next appointment, please call your pharmacy.

## 2020-08-02 ENCOUNTER — Ambulatory Visit (HOSPITAL_COMMUNITY): Payer: Medicare Other | Attending: Cardiovascular Disease

## 2020-08-02 ENCOUNTER — Other Ambulatory Visit: Payer: Self-pay

## 2020-08-02 DIAGNOSIS — R001 Bradycardia, unspecified: Secondary | ICD-10-CM

## 2020-08-02 LAB — ECHOCARDIOGRAM COMPLETE
Area-P 1/2: 2.94 cm2
MV M vel: 4.82 m/s
MV Peak grad: 92.9 mmHg
Radius: 0.6 cm
S' Lateral: 3.8 cm

## 2020-08-25 ENCOUNTER — Telehealth: Payer: Self-pay | Admitting: *Deleted

## 2020-08-25 NOTE — Telephone Encounter (Signed)
Called and spoke w/ wife. Pt at [facility] appt getting set up w/ new machine. Advised we received fax letting us know this as well. He needs follow up at our office between 09/25/20-11/23/20. She does not know his schedule. She will have him call back to set up this appt. Provided office phone#.  Please schedule appt if he calls with either MM,NP or Dr. Brett Fairy, thank

## 2020-10-05 ENCOUNTER — Encounter: Payer: Self-pay | Admitting: Cardiovascular Disease

## 2020-10-05 NOTE — Progress Notes (Signed)
Cardiology Office Note:    Date:  10/05/2020   ID:  Michael [name], DOB 02-Jan-1951, MRN [id number]  PCP:  [name], MD   [facility] Providers Cardiologist:   Gar Glance  Electrophysiologist:  Vickie Epley, MD     Referring MD: [name], MD   Chief Complaint  Patient presents with   Mitral Valve Prolapse     Aug. 25, 2022    Michael [name] is a 70 y.o. male with a hx of MVP , OSA, and bradycardia. He has seen Dr. Quentin Ore for follow up of his bradycardia. Is active,  Exercises regularly Retired from [employer]   Has MVP wit mod - severe MR now  Was mild MR on his last echo in 2014 Has mild LAE  Encourage weight loss , BP control  Was on bystolic but this caused bradycardia  Dr. Joylene Draft tried Zetia - did not tolerate it  Is on simva I think he may do better on rosuvastatin  since Simvastatin has so many drug interactions .   Will defer to Dr. Joylene Draft on this issue for now .  No worsening dyspnea Drinks lots of regular soda .  We discussed weight loss   Past Medical History:  Diagnosis Date   Anal fissure    Anxiety    Convulsions ([place])    AS A CHILD   Diverticulitis    Heart murmur    Hiatal hernia with gastroesophageal reflux    HLD (hyperlipidemia)    MVP (mitral valve prolapse)    with moderate MR per echo in 2009   Normal cardiac stress test January 2014   OSA on CPAP 11/11/2013   Sleep apnea    wears CPAP   Tinnitus    Tobacco abuse    Tobacco use disorder 03/31/2014   White coat hypertension     Past Surgical History:  Procedure Laterality Date   APPENDECTOMY  2006   HERNIA REPAIR     ROTATOR CUFF REPAIR Left 2007    Current Medications: No outpatient medications have been marked as taking for the 10/06/20 encounter (Office Visit) with Odies Desa, Wonda Cheng, MD.     Allergies:   Patient has no known allergies.   Social History   Socioeconomic History   Marital status: Married    Spouse name: Not on file   Number of  children: 0   Years of education: HS   Highest education level: Not on file  Occupational History   Not on file  Tobacco Use   Smoking status: Heavy Smoker    Packs/day: 0.50    Years: 40.00    Pack years: 20.00    Types: Cigarettes    Start date: 04/12/1967    Last attempt to quit: 03/06/2012    Years since quitting: 8.5   Smokeless tobacco: Never  Vaping Use   Vaping Use: Never used  Substance and Sexual Activity   Alcohol use: No    Alcohol/week: 0.0 standard drinks   Drug use: No   Sexual activity: Yes  Other Topics Concern   Not on file  Social History Narrative   Patient is married, does not have children.   Patient is right handed.   Patient has high school education.   Patient does not drink caffeine.   Social Determinants of Health   Financial Resource Strain: Not on file  Food Insecurity: Not on file  Transportation Needs: Not on file  Physical Activity: Not on file  Stress: Not  on file  Social Connections: Not on file     Family History: The patient's family history includes Cancer in his mother; Cancer (age of onset: 74) in an other family member; Hypertension in an other family member; Sleep apnea in his brother.  ROS:   Please see the history of present illness.     All other systems reviewed and are negative.  EKGs/Labs/Other Studies Reviewed:    The following studies were reviewed today:   EKG:    Recent Labs: No results found for requested labs within last 8760 hours.  Recent Lipid Panel No results found for: CHOL, TRIG, HDL, CHOLHDL, VLDL, LDLCALC, LDLDIRECT   Risk Assessment/Calculations:           Physical Exam:    VS:  There were no vitals taken for this visit.    Wt Readings from Last 3 Encounters:  07/05/20 231 lb (104.8 kg)  11/19/19 234 lb 9.6 oz (106.4 kg)  11/19/18 226 lb 3.2 oz (102.6 kg)     GEN:  Well nourished, well developed in no acute distress. Moderately obese  HEENT: Normal NECK: No JVD; No carotid  bruits LYMPHATICS: No lymphadenopathy CARDIAC: RRR,, [id number] systolic murmur radiating to axilla  RESPIRATORY:  Clear to auscultation without rales, wheezing or rhonchi  ABDOMEN: Soft, non-tender, non-distended MUSCULOSKELETAL:  No edema; No deformity  SKIN: Warm and dry NEUROLOGIC:  Alert and oriented x 3 PSYCHIATRIC:  Normal affect   ECG Jul 05, 2020   Sinus bradycardia at 45.  No ST or T wave change   ASSESSMENT:    No diagnosis found. PLAN:      MVP with mod- severe MR.   There is seen today after a 8-year absence.  He previously had mild mitral vegetation.  He now has moderate to severe mitral rotation associated with mitral regurgitation.  He still asymptomatic.  His left atrium is only mildly enlarged.  At this point I think we can continue with medical therapy.  I encouraged him to lose some weight.  His blood pressure is well controlled.  We will have him see an APP in 6 months to make sure he is doing okay.  I will anticipate seeing him again in 1 year.  We will repeat his echocardiogram next year to check for any progression of his mitral regurgitation or enlargement of his left atrium.  2.  Hyperlipidemia: He is currently on simvastatin.  He did not tolerate ezetimibe.  I think that he might do better with rosuvastatin since there are so many drug to drug interactions with simvastatin.  I will defer to Dr. Haynes Kerns on this issue for now.        Medication Adjustments/Labs and Tests Ordered: Current medicines are reviewed at length with the patient today.  Concerns regarding medicines are outlined above.  No orders of the defined types were placed in this encounter.  No orders of the defined types were placed in this encounter.   There are no Patient Instructions on file for this visit.   Signed, Mertie Moores, MD  10/05/2020 4:50 PM    [facility]

## 2020-10-06 ENCOUNTER — Encounter: Payer: Self-pay | Admitting: Cardiovascular Disease

## 2020-10-06 ENCOUNTER — Ambulatory Visit: Payer: Medicare Other | Admitting: Cardiovascular Disease

## 2020-10-06 ENCOUNTER — Other Ambulatory Visit: Payer: Self-pay

## 2020-10-06 VITALS — BP 124/78 | HR 59 | Ht 71.0 in | Wt 233.0 lb

## 2020-10-06 DIAGNOSIS — I340 Nonrheumatic mitral (valve) insufficiency: Secondary | ICD-10-CM

## 2020-10-06 DIAGNOSIS — I341 Nonrheumatic mitral (valve) prolapse: Secondary | ICD-10-CM

## 2020-10-06 HISTORY — DX: Nonrheumatic mitral (valve) insufficiency: I34.0

## 2020-10-06 NOTE — Patient Instructions (Signed)
Medication Instructions:  Your physician recommends that you continue on your current medications as directed. Please refer to the Current Medication list given to you today.  *If you need a refill on your cardiac medications before your next appointment, please call your pharmacy*   Lab Work: None If you have labs (blood work) drawn today and your tests are completely normal, you will receive your results only by: [place] (if you have MyChart) OR A paper copy in the mail If you have any lab test that is abnormal or we need to change your treatment, we will call you to review the results.   Testing/Procedures: None   Follow-Up: At [facility], you and your health needs are our priority.  As part of our continuing mission to provide you with exceptional heart care, we have created designated Provider Care Teams.  These Care Teams include your primary Cardiologist (physician) and Advanced Practice Providers (APPs -  Physician Assistants and Nurse Practitioners) who all work together to provide you with the care you need, when you need it.  We recommend signing up for the patient portal called "MyChart".  Sign up information is provided on this After Visit Summary.  MyChart is used to connect with patients for Virtual Visits (Telemedicine).  Patients are able to view lab/test results, encounter notes, upcoming appointments, etc.  Non-urgent messages can be sent to your provider as well.   To learn more about what you can do with MyChart, go to [email or web address].    Your next appointment:   6 month(s)  The format for your next appointment:   In Person  Provider:   You will see one of the following Advanced Practice Providers on your designated Care Team:   Richardson Dopp, PA-C Robbie Lis, PA-C  Then, Mertie Moores, MD will plan to see you again in 1 year(s).   Other Instructions

## 2020-10-26 ENCOUNTER — Ambulatory Visit
Admission: RE | Admit: 2020-10-26 | Discharge: 2020-10-26 | Disposition: A | Discharge status: Home / Self Care | Payer: Medicare Other | Source: Ambulatory Visit | Attending: Internal Medicine | Admitting: Internal Medicine

## 2020-10-26 ENCOUNTER — Other Ambulatory Visit: Payer: Self-pay | Admitting: Internal Medicine

## 2020-10-26 ENCOUNTER — Other Ambulatory Visit: Payer: Self-pay

## 2020-10-26 DIAGNOSIS — R1011 Right upper quadrant pain: Secondary | ICD-10-CM

## 2020-10-26 DIAGNOSIS — K439 Ventral hernia without obstruction or gangrene: Secondary | ICD-10-CM

## 2020-10-26 MED ORDER — IOPAMIDOL (ISOVUE-300) INJECTION 61%
100.0000 mL | Freq: Once | INTRAVENOUS | Status: AC | PRN
  Administered 2020-10-26: 100 mL via INTRAVENOUS

## 2020-11-14 ENCOUNTER — Encounter: Payer: Self-pay | Admitting: Neurology

## 2020-11-14 ENCOUNTER — Ambulatory Visit: Payer: Medicare Other | Admitting: Neurology

## 2020-11-14 VITALS — BP 123/74 | HR 67 | Ht 71.0 in | Wt 231.0 lb

## 2020-11-14 DIAGNOSIS — Z9989 Dependence on other enabling machines and devices: Secondary | ICD-10-CM

## 2020-11-14 DIAGNOSIS — G4733 Obstructive sleep apnea (adult) (pediatric): Secondary | ICD-10-CM

## 2020-11-14 DIAGNOSIS — E669 Obesity, unspecified: Secondary | ICD-10-CM

## 2020-11-14 NOTE — Patient Instructions (Signed)

## 2020-11-14 NOTE — Progress Notes (Signed)
SLEEP MEDICINE CLINIC   Provider:  Larey Seat, M D  Referring Provider: [name], MD Primary Care Physician:  [name], MD  Chief Complaint  Patient presents with   Obstructive Sleep Apnea    RM 10, alone. Here for initial CPAP f/u on his new CPAP. Pt reports doing well.      HPI:  NEERAJ HOUSAND is a 69 y.o. male  , who is seen here as a referral from Dr. Joylene Draft for a sleep evaluation. 11-14-2020.  On Mr. Boomer underwent a repeat home sleep test in order to get a new machine and had been seen in his last visit by Vaughan Browner who ordered the home sleep test.  His home sleep test was positive for sleep apnea he was able to sleep 8 hours 57 minutes 9% REM time, calculated AHI was 15.2 and no significant oxygen desaturation was noted he was bradycardic test that was notable.  He endorsed today the Epworth sleepiness score at 2 points, the geriatric depression score is 0 out of 15 and he is using a 5 through 12 cm water pressure auto titration device by [employer] this to centimeter EPR.  His old machine started at 7 cm and he asked me today to start this machine also at 7 cm water.  His residual AHI was 0.3 which is excellent he does have some air leakage but it does not wake him up and his 95th percentile pressure is 11 cmH2O.  His BMI has not significantly changed.  He has quit drinking soft drinks.  I hope that will help him in terms of his medication list I reviewed that and see nothing that would contribute or worsen sleepiness.   CD:Mr. Deetz was first diagnosed with obstructive sleep apnea approximately 20 years ago in [place].  7 years ago he was retested at [facility] sleep, but couldn't sleep. The patient continued to use CPAP but had no supplier for parts. He is compliant and feels that CPAP works well for him, but had no follow up by sleep MD for many years.The patient's study was from 2010 and was performed a year after he was diagnosed with a heart murmur and and  abnormal echo 2009.  The patient also has a history of hiatal hernia with acid reflux, convulsions as a child possible febrile seizures, tinnitus, Mnire's disease, diverticulitis, hypertension. The patient is retired and used to work for [employer]. 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    Sleep habits are as follows: The patient will to bed around 10:00, usually watches TV and at that until he 'dozes off" shares the bedroom with his wife. Takes some nights ambien. This may take an hour.  Recently he begun waking up spontaneously at 5 AM and is not sure why. It is not nocturia but wakes him he's not in pain or any discomfort he is just wide-awake. His average hours of sleep at night are 6 hours. He has always been spontaneously waking  and does not rely on an alarm.  He feels refreshed and restored when first rising in the morning. He drinks decaffeinated coffee. He drinks soft drinks with caffeine. Around 1 or 2 PM he feels like he would like and now. If he is a little to now he will nap for about 30 minutes duration and feels refreshed. He does not wake up with headaches or discomfort. He does not recall dreaming during his short naps.  His  wife reported no sleep talking, walking. No snoring on CPAP and no apneas witnessed on CPAP.  03-31-14 Mr. Rathbone underwent a polysomnography on 12-16-2013 at [facility]. The patient was diagnosed with obstructive sleep apnea at an AHI of 23.9/hr.  and an RDI of 34.2 per hour-  in addition there were several so-called respiratory event related arousals indicating upper airway resistance syndrome.  The patient also had some periodic limb movements and " kicked himself awake"  several times at night. The arousal index was very high at 40.5 per hour the PLM arousal index was 9.9. His heart rate was slow normal sinus rhythm. The patient was titrated to 8 cm water pressure which resolved his apnea a ResMed nasal pillow mask  a so-called air-fluid P 10 in medium size was used. He regained a normal sleep architecture under CPAP use. The significant decrease in arousals and periodic limb movements as well. There was significant REM rebound noted 1 CPAP was initiated. Today's compliance report shows an AHI of 1.4 the set pressure of 8 cm water was 1 cm EPR. Average user time is 7 hours and 16 minutes the patient alternates between to CPAP of which one is more trouble friendly. He used this machine 22 out of 22 days with 100% compliance over 4 hours. He used his alternative machines in the week prior with similar download results. His Epworth sleepiness score today is endorsed at 2 points and his fatigue at 26 points. He has cut down on tobacco use, he has not gained weight.  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   11-09-14 Mr. Ostlund is here on his first visit since entering Medicare. [facility] his primary medical equipment company asked him to have a follow-up with his sleep physician to gain coverage through the Medicare system. He is here to document again his compliance. A download was obtained dated 9-20 5-16 which was 100% compliance was 30 out of 30 days all those days was over 4 hours or fumes. Average user time is 9 hours and 41 minutes. The machine is set at 8 cm water was 1 cm EPR and a residual AHI of 1.2, which is close to an ideal result. He does not have significant air leaks on the on rare occasions. His Epworth sleepiness score is endorsed at 3 points fatigue severity at 25 points and his geriatric depression inventory at 0 points.  Interval history from 11/09/2015. Mr. Coiner has retired and enjoys the last summer very much. Continues to smoke. Continues to use CPAP and states that he no longer can sleep without it. Compliance is 90% 27 out of 30 days over 4 hours of consecutive use was an average of 8 hours and 40 minutes. CPAP is set at 8 cm water pressure was 1 cm EPR and  an AHI of 1.0. He uses his second  CPAP at his second home, a [place].  11-13-2016,  Mr. Kolker is seen year today for a routine yearly CPAP compliance visit he has been using his CPAP 100% compliant with a residual AHI of 0.4 per hour, CPAP is set at 8 cm water was 17 m EPR. He is followed by [facility]. Average user time is 10 hours, he has moderate air leaks. No change in settings as needed. He does not need any supplies at this time. Epworth sleepiness score is endorsed at 2 points fatigue severity at 17 points, geriatric depression score at  0 out of 15. He no longer uses Ambien !      Review of Systems: Out of a complete 14 system review, the patient complains of only the following symptoms, and all other reviewed systems are negative. Daytime naps, fatigue after lunch. No nocturia. His Epworth sleepiness score is endorsed at 2 points fatigue severity at 17 points and his geriatric depression inventory at 0/ 15  points.   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 [place], he is Public house manager, likes to fish.    Social History   Socioeconomic History   Marital status: Married    Spouse name: Not on file   Number of children: 0   Years of education: HS   Highest education level: Not on file  Occupational History   Not on file  Tobacco Use   Smoking status: Heavy Smoker    Packs/day: 0.50    Years: 40.00    Pack years: 20.00    Types: Cigarettes    Start date: 04/12/1967    Last attempt to quit: 03/06/2012    Years since quitting: 8.6   Smokeless tobacco: Never  Vaping Use   Vaping Use: Never used  Substance and Sexual Activity   Alcohol use: No    Alcohol/week: 0.0 standard drinks   Drug use: No   Sexual activity: Yes  Other Topics Concern   Not on file  Social History Narrative   Patient is married, does not have children.   Patient is right handed.   Patient has high school education.    Patient does not drink caffeine.   Social Determinants of Health   Financial Resource Strain: Not on file  Food Insecurity: Not on file  Transportation Needs: Not on file  Physical Activity: Not on file  Stress: Not on file  Social Connections: Not on file  Intimate Partner Violence: Not on file    Family History  Problem Relation Age of Onset   Cancer Mother    Cancer Other 22       PANCREATIC CANCER   Hypertension Other    Sleep apnea Brother        CPAP    Past Medical History:  Diagnosis Date   Anal fissure    Anxiety    Convulsions ([place])    AS A CHILD   Diverticulitis    Heart murmur    Hiatal hernia with gastroesophageal reflux    HLD (hyperlipidemia)    MVP (mitral valve prolapse)    with moderate MR per echo in 2009   Normal cardiac stress test January 2014   OSA on CPAP 11/11/2013   Sleep apnea    wears CPAP   Tinnitus    Tobacco abuse    Tobacco use disorder 03/31/2014   White coat hypertension     Past Surgical History:  Procedure Laterality Date   APPENDECTOMY  2006   HERNIA REPAIR     ROTATOR CUFF REPAIR Left 2007    Current Outpatient Medications  Medication Sig Dispense Refill   aspirin 81 MG tablet Take 81 mg by mouth daily.     B Complex CAPS Take 1 capsule by mouth daily.     cholecalciferol (VITAMIN D) 1000 UNITS tablet Take 1,000 Units by mouth daily.     FIBER COMPLETE PO Take 1 tablet by mouth daily.      fluticasone (FLONASE) 50 MCG/ACT nasal spray two sprays per nostril  daily prn     loratadine (CLARITIN) 10 MG tablet Take 10 mg by mouth as needed for allergies.     losartan (COZAAR) 25 MG tablet Take 50 mg by mouth at bedtime.      pantoprazole (PROTONIX) 40 MG tablet Take 40 mg by mouth daily.     sertraline (ZOLOFT) 50 MG tablet Take 50 mg by mouth daily.     simvastatin (ZOCOR) 20 MG tablet Take 20 mg by mouth at bedtime.     tamsulosin (FLOMAX) 0.4 MG CAPS capsule Take 0.4 mg by mouth daily.     No current  facility-administered medications for this visit.    Allergies as of 11/14/2020   (No Known Allergies)    Vitals: BP 123/74   Pulse 67   Ht 5\' 11"  (1.803 m)   Wt 231 lb (104.8 kg)   BMI 32.22 kg/m  Last Weight:  Wt Readings from Last 1 Encounters:  11/14/20 231 lb (104.8 kg)       Last Height:   Ht Readings from Last 1 Encounters:  11/14/20 5\' 11"  (1.803 m)    Physical exam:  General: The patient is awake, alert and appears not in acute distress.  The patient is well groomed. Head: Normocephalic, atraumatic. Neck is supple. Mallampati 3 ,  neck circumference: 19.0 inches . Nasal airflow congested. Cardiovascular:  Regular rate and rhythm , without distended neck veins. Respiratory: Lungs are clear to auscultation. Skin:  Without evidence of edema, or rash Trunk: BMI is  elevated and patient  has normal posture.  Neurologic exam : Cranial nerves: Pupils are equal and briskly reactive to light. Hearing to finger rub intact.  Facial sensation intact to fine touch. Facial motor strength is symmetric and tongue and uvula move midline.  ASSESSMENT AND PLAN 70 y.o. year old male  has a past medical history of Anal fissure, Anxiety, Convulsions ([place]), Diverticulitis, Heart murmur, Hiatal hernia with gastroesophageal reflux, HLD (hyperlipidemia), MVP (mitral valve prolapse), Normal cardiac stress test (January 2014), OSA on CPAP (11/11/2013), Sleep apnea, Tinnitus, Tobacco abuse, Tobacco use disorder (03/31/2014), and White coat hypertension. here with:  OSA on CPAP  - CPAP compliance excellent- he got a new CPAP with modem.  [employer]- 100% compliance.  Residual 0.3/h.   He considers himself CPAP dependent.  - Good treatment of AHI  - Encourage patient to use CPAP nightly and > 4 hours each night- F/U in 1 year or sooner if needed   I spent 20 minutes of face-to-face and non-face-to-face time with patient.  This included previsit chart review, lab review, study review, order entry,  electronic health record documentation, patient education.  Asencion Partridge Jaliza Seifried, MD 11/14/2020, 9:48 AM [facility] [address] [place], [place] [address] [phone]     OSA on CPAP, - HST : Now mild  OSA, 100% compliance, residual AHI w very low.  Patient considers himself CPAP dependent.   Highly compliant by CMS criteria 100%. Has a second CPAP without WiFi capability at [facility].  Rv in 12  Month with NP.     Asencion Partridge Naksh Radi MD  11/14/2020   Cc PCP is Dr Joylene Draft.

## 2021-04-03 ENCOUNTER — Encounter: Payer: Self-pay | Admitting: Physician Assistant

## 2021-04-03 DIAGNOSIS — I700 Atherosclerosis of aorta: Secondary | ICD-10-CM | POA: Insufficient documentation

## 2021-04-03 DIAGNOSIS — I2510 Atherosclerotic heart disease of native coronary artery without angina pectoris: Secondary | ICD-10-CM

## 2021-04-03 HISTORY — DX: Atherosclerotic heart disease of native coronary artery without angina pectoris: I25.10

## 2021-04-03 HISTORY — DX: Atherosclerosis of aorta: I70.0

## 2021-04-03 NOTE — Progress Notes (Signed)
Cardiology Office Note:    Date:  04/04/2021   ID:  Michael [name], DOB October 07, 1950, MRN [id number]  PCP:  Michael Infante, MD  [facility] Providers Cardiologist:  Michael Moores, MD Electrophysiologist:  Michael Epley, MD    Referring MD: Michael Infante, MD   Chief Complaint:  F/u on MR    Patient Profile: MVP w mod to severe MR Coronary artery Ca2+ (CT in 6/21) Bradycardia Improved w stopping beta-blocker  Hypertension  Hyperlipidemia  Intol of Zetia OSA  +Cigs Obesity  Aortic atherosclerosis   Prior CV Studies: Echocardiogram 08/02/20 EF 60-65, no RWMA, Gr 2 DD, normal RVSF, normal PASP, mild BAE, myxomatous MV with mild posterior MVP and mod to severe MR  Echocardiogram 02/28/12 Mild LVH, EF 65-70, no RMWA, mild MR, mod LAE, mild to mod RAE  Myoview 02/21/12 Apical thinning, no ischemia, EF 62   History of Present Illness:   Michael [name] is a 71 y.o. male with the above problem list.  He was last seen in 8/22.  He returns for f/u.  He is here alone.  Since last seen, he has done well.  He has not had significant shortness of breath, orthopnea, leg edema, syncope, near syncope.  He has not had PND.  He has not had chest discomfort.        Past Medical History:  Diagnosis Date   Anal fissure    Anxiety    Aortic atherosclerosis ([place]) 04/03/2021   CT in 6/21   Convulsions ([place])    AS A CHILD   Coronary artery calcification seen on CT scan 04/03/2021   Chest CT in 6/21   Diverticulitis    Heart murmur    Hiatal hernia with gastroesophageal reflux    HLD (hyperlipidemia)    MVP (mitral valve prolapse)    with moderate MR per echo in 2009   Normal cardiac stress test January 2014   OSA on CPAP 11/11/2013   Sleep apnea    wears CPAP   Tinnitus    Tobacco abuse    Tobacco use disorder 03/31/2014   White coat hypertension    Current Medications: Current Meds  Medication Sig   aspirin 81 MG tablet Take 81 mg by mouth daily.   B Complex CAPS Take 1 capsule  by mouth daily.   cholecalciferol (VITAMIN D) 1000 UNITS tablet Take 1,000 Units by mouth daily.   FIBER COMPLETE PO Take 1 tablet by mouth daily.    fluticasone (FLONASE) 50 MCG/ACT nasal spray two sprays per nostril daily prn   loratadine (CLARITIN) 10 MG tablet Take 10 mg by mouth as needed for allergies.   losartan (COZAAR) 25 MG tablet Take 50 mg by mouth at bedtime.    pantoprazole (PROTONIX) 40 MG tablet Take 40 mg by mouth daily.   Probiotic Product (DAILY PROBIOTIC PO) Take by mouth.   sertraline (ZOLOFT) 50 MG tablet Take 50 mg by mouth daily.   simvastatin (ZOCOR) 20 MG tablet Take 20 mg by mouth at bedtime.   tamsulosin (FLOMAX) 0.4 MG CAPS capsule Take 0.4 mg by mouth daily.    Allergies:   Patient has no active allergies.   Social History   Tobacco Use   Smoking status: Heavy Smoker    Packs/day: 0.50    Years: 40.00    Pack years: 20.00    Types: Cigarettes    Start date: 04/12/1967    Last attempt to quit: 03/06/2012    Years since quitting:  9.0   Smokeless tobacco: Never  Vaping Use   Vaping Use: Never used  Substance Use Topics   Alcohol use: No    Alcohol/week: 0.0 standard drinks   Drug use: No    Family Hx: The patient's family history includes Cancer in his mother; Cancer (age of onset: 72) in an other family member; Hypertension in an other family member; Sleep apnea in his brother.  Review of Systems  Cardiovascular:  Negative for claudication.    EKGs/Labs/Other Test Reviewed:    EKG:  EKG is not ordered today.  The ekg ordered today demonstrates n/a  Recent Labs: No results found for requested labs within last 8760 hours.   Recent Lipid Panel No results for input(s): CHOL, TRIG, HDL, VLDL, LDLCALC, LDLDIRECT in the last 8760 hours.   Risk Assessment/Calculations:         Physical Exam:    VS:  BP 128/82    Pulse 74    Ht 5\' 11"  (1.803 m)    Wt 229 lb (103.9 kg)    SpO2 97%    BMI 31.94 kg/m     Wt Readings from Last 3 Encounters:   04/04/21 229 lb (103.9 kg)  11/14/20 231 lb (104.8 kg)  10/06/20 233 lb (105.7 kg)    Constitutional:      Appearance: Healthy appearance. Not in distress.  Neck:     Vascular: No JVR. JVD normal.  Pulmonary:     Effort: Pulmonary effort is normal.     Breath sounds: No wheezing. No rales.  Cardiovascular:     Normal rate. Regular rhythm. Normal S1. Normal S2.      Murmurs: There is a grade 2/6 holosystolic murmur at the LLSB and apex.  Edema:    Peripheral edema absent.  Abdominal:     Palpations: Abdomen is soft.  Skin:    General: Skin is warm and dry.  Neurological:     General: No focal deficit present.     Mental Status: Alert and oriented to person, place and time.     Cranial Nerves: Cranial nerves are intact.        ASSESSMENT & PLAN:   Moderate to severe mitral regurgitation No change in clinical symptoms.  Arrange follow-up echocardiogram prior to next visit with Dr. Acie Fredrickson in 6 months  Coronary artery calcification seen on CT scan Chronic calcification noted on prior CT scan.  He is not describing any symptoms consistent with angina.  Continue aspirin 81 mg daily, simvastatin 20 mg daily  Aortic atherosclerosis (HCC) Continue aspirin, statin therapy.  Hypertension Blood pressure well controlled.  Continue losartan 50 mg daily  HLD (hyperlipidemia) Managed by primary care.  He remains on simvastatin.  Tobacco use disorder We discussed the importance of quitting.           Dispo:  Return in about 6 months (around 10/02/2021) for Routine Follow Up, w/ Dr. Acie Fredrickson.   Medication Adjustments/Labs and Tests Ordered: Current medicines are reviewed at length with the patient today.  Concerns regarding medicines are outlined above.  Tests Ordered: Orders Placed This Encounter  Procedures   ECHOCARDIOGRAM COMPLETE   Medication Changes: No orders of the defined types were placed in this encounter.  Signed, Richardson Dopp, PA-C  04/04/2021 9:09 AM    [facility] [place], [place], [place]  [address] Phone: [phone]; Fax: [phone]

## 2021-04-04 ENCOUNTER — Ambulatory Visit: Payer: Medicare Other | Admitting: Physician Assistant

## 2021-04-04 ENCOUNTER — Other Ambulatory Visit: Payer: Self-pay

## 2021-04-04 ENCOUNTER — Encounter: Payer: Self-pay | Admitting: Physician Assistant

## 2021-04-04 VITALS — BP 128/82 | HR 74 | Ht 71.0 in | Wt 229.0 lb

## 2021-04-04 DIAGNOSIS — I10 Essential (primary) hypertension: Secondary | ICD-10-CM | POA: Diagnosis not present

## 2021-04-04 DIAGNOSIS — I340 Nonrheumatic mitral (valve) insufficiency: Secondary | ICD-10-CM

## 2021-04-04 DIAGNOSIS — E7800 Pure hypercholesterolemia, unspecified: Secondary | ICD-10-CM | POA: Diagnosis not present

## 2021-04-04 DIAGNOSIS — I341 Nonrheumatic mitral (valve) prolapse: Secondary | ICD-10-CM

## 2021-04-04 DIAGNOSIS — F17200 Nicotine dependence, unspecified, uncomplicated: Secondary | ICD-10-CM

## 2021-04-04 DIAGNOSIS — I2510 Atherosclerotic heart disease of native coronary artery without angina pectoris: Secondary | ICD-10-CM

## 2021-04-04 DIAGNOSIS — I700 Atherosclerosis of aorta: Secondary | ICD-10-CM

## 2021-04-04 NOTE — Assessment & Plan Note (Signed)
Continue aspirin, statin therapy 

## 2021-04-04 NOTE — Assessment & Plan Note (Signed)
Chronic calcification noted on prior CT scan.  He is not describing any symptoms consistent with angina.  Continue aspirin 81 mg daily, simvastatin 20 mg daily

## 2021-04-04 NOTE — Assessment & Plan Note (Signed)
Blood pressure well-controlled.  Continue losartan 50 mg daily. 

## 2021-04-04 NOTE — Assessment & Plan Note (Signed)
No change in clinical symptoms.  Arrange follow-up echocardiogram prior to next visit with Dr. Acie Fredrickson in 6 months

## 2021-04-04 NOTE — Assessment & Plan Note (Signed)
Managed by primary care.  He remains on simvastatin.

## 2021-04-04 NOTE — Patient Instructions (Signed)
Medication Instructions:   Your physician recommends that you continue on your current medications as directed. Please refer to the Current Medication list given to you today.   *If you need a refill on your cardiac medications before your next appointment, please call your pharmacy*  Testing/Procedures:  Your physician has requested that you have an echocardiogram. Echocardiography is a painless test that uses sound waves to create images of your heart. It provides your doctor with information about the size and shape of your heart and how well your hearts chambers and valves are working. This procedure takes approximately one hour. There are no restrictions for this procedure.    Follow-Up: At [facility], you and your health needs are our priority.  As part of our continuing mission to provide you with exceptional heart care, we have created designated Provider Care Teams.  These Care Teams include your primary Cardiologist (physician) and Advanced Practice Providers (APPs -  Physician Assistants and Nurse Practitioners) who all work together to provide you with the care you need, when you need it.  We recommend signing up for the patient portal called "MyChart".  Sign up information is provided on this After Visit Summary.  MyChart is used to connect with patients for Virtual Visits (Telemedicine).  Patients are able to view lab/test results, encounter notes, upcoming appointments, etc.  Non-urgent messages can be sent to your provider as well.   To learn more about what you can do with MyChart, go to [email or web address].    Your next appointment:   6 month(s)  The format for your next appointment:   In Person  Provider:   Mertie Moores, MD

## 2021-04-04 NOTE — Assessment & Plan Note (Signed)
We discussed the importance of quitting. 

## 2021-05-25 ENCOUNTER — Other Ambulatory Visit: Payer: Self-pay | Admitting: Internal Medicine

## 2021-05-25 DIAGNOSIS — F17200 Nicotine dependence, unspecified, uncomplicated: Secondary | ICD-10-CM

## 2021-06-20 ENCOUNTER — Ambulatory Visit
Admission: RE | Admit: 2021-06-20 | Discharge: 2021-06-20 | Disposition: A | Discharge status: Home / Self Care | Payer: Medicare Other | Source: Ambulatory Visit | Attending: Internal Medicine | Admitting: Internal Medicine

## 2021-06-20 DIAGNOSIS — F17200 Nicotine dependence, unspecified, uncomplicated: Secondary | ICD-10-CM

## 2021-09-12 ENCOUNTER — Ambulatory Visit (HOSPITAL_COMMUNITY): Payer: Medicare Other | Attending: Cardiovascular Disease

## 2021-09-12 DIAGNOSIS — I341 Nonrheumatic mitral (valve) prolapse: Secondary | ICD-10-CM | POA: Diagnosis not present

## 2021-09-12 DIAGNOSIS — I700 Atherosclerosis of aorta: Secondary | ICD-10-CM | POA: Diagnosis present

## 2021-09-12 DIAGNOSIS — I2510 Atherosclerotic heart disease of native coronary artery without angina pectoris: Secondary | ICD-10-CM

## 2021-09-12 DIAGNOSIS — E7800 Pure hypercholesterolemia, unspecified: Secondary | ICD-10-CM | POA: Diagnosis not present

## 2021-09-12 DIAGNOSIS — I10 Essential (primary) hypertension: Secondary | ICD-10-CM

## 2021-09-12 DIAGNOSIS — I340 Nonrheumatic mitral (valve) insufficiency: Secondary | ICD-10-CM

## 2021-09-12 DIAGNOSIS — F17200 Nicotine dependence, unspecified, uncomplicated: Secondary | ICD-10-CM

## 2021-09-12 LAB — ECHOCARDIOGRAM COMPLETE
Area-P 1/2: 3.74 cm2
S' Lateral: 4.1 cm

## 2021-09-17 ENCOUNTER — Encounter: Payer: Self-pay | Admitting: Physician Assistant

## 2021-09-25 ENCOUNTER — Encounter: Payer: Self-pay | Admitting: Cardiovascular Disease

## 2021-09-25 NOTE — Progress Notes (Unsigned)
Cardiology Office Note:    Date:  09/26/2021   ID:  Michael [name], DOB 1950-12-26, MRN [id number]  PCP:  [name], MD   [facility] Providers Cardiologist:   Livvy Spilman   Electrophysiologist:  Vickie Epley, MD     Referring MD: [name], MD   Chief Complaint  Patient presents with   Mitral Valve Prolapse     Aug. 25, 2022    Michael [name] is a 71 y.o. male with a hx of MVP , OSA, and bradycardia. He has seen Dr. Quentin Ore for follow up of his bradycardia. Is active,  Exercises regularly Retired from [employer]   Has MVP wit mod - severe MR now  Was mild MR on his last echo in 2014 Has mild LAE  Encourage weight loss , BP control  Was on bystolic but this caused bradycardia  Dr. Joylene Draft tried Zetia - did not tolerate it  Is on simva I think he may do better on rosuvastatin  since Simvastatin has so many drug interactions .   Will defer to Dr. Joylene Draft on this issue for now .  No worsening dyspnea Drinks lots of regular soda .  We discussed weight loss  September 26, 2021:  There is seen today for follow-up of his moderate to severe mitral regurgitation.  He has a history of endocarditis in the past.  He has a history of hyperlipidemia and has been on simvastatin.  He has tried Zetia but did not tolerate it.  No dysnea Has stopped smoking 3 months ago   Lipid levels from his primary medical doctor in April, 2023 reveals a total cholesterol of 122 HDL is 35 LDL is 66 Triglyceride level is 104 Hemoglobin A1c is 5.5 Hemoglobin is 17.6.    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   B Complex CAPS Take 1 capsule by mouth daily.   cholecalciferol (VITAMIN D) 1000 UNITS tablet Take 1,000 Units by mouth daily.   FIBER COMPLETE PO Take 1 tablet by mouth daily.    fluticasone (FLONASE) 50 MCG/ACT nasal spray two sprays per nostril daily prn   loratadine (CLARITIN) 10 MG tablet Take 10 mg by mouth as needed for allergies.  losartan (COZAAR) 25 MG tablet Take 50 mg by mouth at bedtime.    pantoprazole (PROTONIX) 40 MG tablet Take 40 mg by mouth daily.   Probiotic Product (DAILY PROBIOTIC PO) Take by mouth.   sertraline (ZOLOFT) 50 MG tablet Take 50 mg by mouth daily.   simvastatin (ZOCOR) 20 MG tablet Take 20 mg by mouth at bedtime.   tamsulosin (FLOMAX) 0.4 MG CAPS capsule Take 0.4 mg by mouth daily.     Allergies:   Patient has no active allergies.   Social History   Socioeconomic History   Marital status: Married    Spouse name: Not on file   Number of children: 0   Years of education: HS   Highest education level: Not on file  Occupational History   Not on file  Tobacco Use   Smoking status: Heavy Smoker    Packs/day: 0.50    Years: 40.00    Total pack years: 20.00    Types: Cigarettes    Start date: 04/12/1967    Last attempt to quit: 03/06/2012    Years since quitting: 9.5   Smokeless  tobacco: Never  Vaping Use   Vaping Use: Never used  Substance and Sexual Activity   Alcohol use: No    Alcohol/week: 0.0 standard drinks of alcohol   Drug use: No   Sexual activity: Yes  Other Topics Concern   Not on file  Social History Narrative   Patient is married, does not have children.   Patient is right handed.   Patient has high school education.   Patient does not drink caffeine.   Social Determinants of Health   Financial Resource Strain: Not on file  Food Insecurity: Not on file  Transportation Needs: Not on file  Physical Activity: Not on file  Stress: Not on file  Social Connections: Not on file     Family History: The patient's family history includes Cancer in his mother; Cancer (age of onset: 94) in an other family member; Hypertension in an other family member; Sleep apnea in his brother.  ROS:   Please see the history of present illness.     All other systems reviewed and are negative.  EKGs/Labs/Other Studies Reviewed:    The following studies were reviewed today:     Recent Labs: No results found for requested labs within last 365 days.  Recent Lipid Panel No results found for: "CHOL", "TRIG", "HDL", "CHOLHDL", "VLDL", "LDLCALC", "LDLDIRECT"   Risk Assessment/Calculations:           Physical Exam:    Physical Exam: Blood pressure 122/70, pulse (!) 41, height '5\' 11"'$  (1.803 m), weight 226 lb 9.6 oz (102.8 kg), SpO2 94 %.  GEN:  Well nourished, well developed in no acute distress HEENT: Normal NECK: No JVD; No carotid bruits LYMPHATICS: No lymphadenopathy CARDIAC: RRR , bradycardia,   2/6 systolic murmur radiating to L ax line  RESPIRATORY:  Clear to auscultation without rales, wheezing or rhonchi  ABDOMEN: Soft, non-tender, non-distended MUSCULOSKELETAL:  No edema; No deformity  SKIN: Warm and dry NEUROLOGIC:  Alert and oriented x 3   EKG: September 26, 2021: Sinus bradycardia at 41.  Left anterior fascicular block.   ASSESSMENT:     1. Mitral valve insufficiency, unspecified etiology   2. Mitral valve prolapse    PLAN:      MVP with mod- severe MR.    -  clinically stable .   We discussed the need for possible valve surgery at  some point in the future.  Clinically does not need to be considered for valve repair at this time.  2.  Hyperlipidemia: Lipid levels were checked by Dr. Joylene Draft.  Levels look good.  LDL is 66.  3.  Moderate obesity: Encouraged him to work on diet, exercise, weight loss.        Medication Adjustments/Labs and Tests Ordered: Current medicines are reviewed at length with the patient today.  Concerns regarding medicines are outlined above.  Orders Placed This Encounter  Procedures   EKG 12-Lead   No orders of the defined types were placed in this encounter.   Patient Instructions  Medication Instructions:  Your physician recommends that you continue on your current medications as directed. Please refer to the Current Medication list given to you today.  *If you need a refill on your cardiac medications before your next appointment, please call your pharmacy*   Lab Work: NONE If you have labs (blood work) drawn today and your tests are completely normal, you will receive your results only by: [place] (if you have MyChart) OR A paper copy in the mail If you have any lab test that is abnormal or we need to change your treatment, we will call you to review the results.   Testing/Procedures: NONE   Follow-Up: At [facility], you and your health needs are our priority.  As part of our continuing mission to provide you with exceptional heart care, we have created designated Provider Care Teams.  These Care Teams include your primary Cardiologist (physician) and Advanced Practice Providers (APPs -  Physician Assistants and Nurse Practitioners) who all work together to provide you with the care you need, when you need it.  We recommend signing up for the patient portal  called "MyChart".  Sign up information is provided on this After Visit Summary.  MyChart is used to connect with patients for Virtual Visits (Telemedicine).  Patients are able to view lab/test results, encounter notes, upcoming appointments, etc.  Non-urgent messages can be sent to your provider as well.   To learn more about what you can do with MyChart, go to [email or web address].    Your next appointment:   1 year(s)  The format for your next appointment:   In Person  Provider:   Mertie Moores, MD       Important Information About Sugar         Signed, Mertie Moores, MD  09/26/2021 5:45 PM    [place]

## 2021-09-26 ENCOUNTER — Encounter: Payer: Self-pay | Admitting: Cardiovascular Disease

## 2021-09-26 ENCOUNTER — Ambulatory Visit: Payer: Medicare Other | Admitting: Cardiovascular Disease

## 2021-09-26 VITALS — BP 122/70 | HR 41 | Ht 71.0 in | Wt 226.6 lb

## 2021-09-26 DIAGNOSIS — I340 Nonrheumatic mitral (valve) insufficiency: Secondary | ICD-10-CM

## 2021-09-26 DIAGNOSIS — I341 Nonrheumatic mitral (valve) prolapse: Secondary | ICD-10-CM | POA: Diagnosis not present

## 2021-09-26 NOTE — Patient Instructions (Signed)

## 2021-11-13 NOTE — Patient Instructions (Signed)

## 2021-11-13 NOTE — Progress Notes (Unsigned)
PATIENT: Michael Michael [name] DOB: 1950/05/01  REASON FOR VISIT: follow up HISTORY FROM: patient  No chief complaint on file.    HISTORY OF PRESENT ILLNESS:  11/13/21 ALL:  Michael Michael [name] is a 71 y.o. male here today for follow up for OSA on CPAP.      HISTORY: (copied from Michael Michael [name]'s previous note)  HPI:  Michael Michael [name] is a 71 y.o. male  , who is seen here as a referral from Michael. Joylene [name] for a sleep evaluation. 11-14-2020.   On Michael Michael [name] underwent a repeat home sleep test in order to get a new machine and had been seen in his last visit by Michael Michael [name] who ordered the home sleep test.  His home sleep test was positive for sleep apnea he was able to sleep 8 hours 57 minutes 9% REM time, calculated AHI was 15.2 and no significant oxygen desaturation was noted he was bradycardic test that was notable.  He endorsed today the Epworth sleepiness score at 2 points, the geriatric depression score is 0 out of 15 and he is using a 5 through 12 cm water pressure auto titration device by [employer] this to centimeter EPR.  His old machine started at 7 cm and he asked me today to start this machine also at 7 cm water.  His residual AHI was 0.3 which is excellent he does have some air leakage but it does not wake him up and his 95th percentile pressure is 11 cmH2O.  His BMI has not significantly changed.  He has quit drinking soft drinks.  I hope that will help him in terms of his medication list I reviewed that and see nothing that would contribute or worsen sleepiness.   REVIEW OF SYSTEMS: Out of a complete 14 system review of symptoms, the patient complains only of the following symptoms, and all other reviewed systems are negative.  ESS:  Michael Michael [name]  HOME MEDICATIONS: Outpatient Medications Prior to Visit  Medication Sig Dispense Refill   aspirin 81 MG tablet Take 81 mg by mouth daily.     B Complex CAPS Take 1 capsule by mouth daily.     cholecalciferol (VITAMIN D)  1000 UNITS tablet Take 1,000 Units by mouth daily.     FIBER COMPLETE PO Take 1 tablet by mouth daily.      fluticasone (FLONASE) 50 MCG/ACT nasal spray two sprays per nostril daily prn     loratadine (CLARITIN) 10 MG tablet Take 10 mg by mouth as needed for Michael [name].     losartan (COZAAR) 25 MG tablet Take 50 mg by mouth at bedtime.      pantoprazole (PROTONIX) 40 MG tablet Take 40 mg by mouth daily.     Probiotic Product (DAILY PROBIOTIC PO) Take by mouth.     sertraline (ZOLOFT) 50 MG tablet Take 50 mg by mouth daily.     simvastatin (ZOCOR) 20 MG tablet Take 20 mg by mouth at bedtime.     tamsulosin (FLOMAX) 0.4 MG CAPS capsule Take 0.4 mg by mouth daily.     No facility-administered medications prior to visit.    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 and moderate to severe mitral regurgitation   MVP (mitral valve prolapse)    with moderate MR per echo in 2009   Normal cardiac stress test January 2014   OSA on CPAP 11/11/2013   Sleep apnea    wears CPAP   Tinnitus    Tobacco abuse    Tobacco use disorder 03/31/2014   White coat hypertension     PAST SURGICAL HISTORY: Past Surgical History:  Procedure Laterality Date   APPENDECTOMY  2006   HERNIA REPAIR     ROTATOR CUFF REPAIR Left 2007    FAMILY HISTORY: Family History  Problem Relation Age of Onset   Cancer Mother    Cancer Other 65       PANCREATIC CANCER   Hypertension Other     Sleep apnea Brother        CPAP    SOCIAL HISTORY: Social History   Socioeconomic History   Marital status: Married    Spouse name: Not on file   Number of children: 0   Years of education: HS   Highest education level: Not on file  Occupational History   Not on file  Tobacco Use   Smoking status: Heavy Smoker    Packs/day: 0.50    Years: 40.00    Total pack years: 20.00    Types: Cigarettes    Start date: 04/12/1967    Last attempt to quit: 03/06/2012    Years since quitting: 9.6   Smokeless tobacco: Never  Vaping Use   Vaping Use: Never used  Substance and Sexual Activity   Alcohol use: No    Alcohol/week: 0.0 standard drinks of alcohol   Drug use: No   Sexual activity: Yes  Other Topics Concern   Not on file  Social History Narrative   Patient is married, does not have children.   Patient is right handed.   Patient has high school education.   Patient does not drink caffeine.   Social Determinants of Health   Financial Resource Strain: Not on file  Food Insecurity: Not on file  Transportation Needs: Not on file  Physical Activity: Not on file  Stress: Not on file  Social Connections: Not on file  Intimate Partner Violence: Not on file     PHYSICAL EXAM  There were no vitals filed for this visit. There is no height or weight on file to calculate BMI.  Generalized: Well developed, in no acute distress  Cardiology: normal rate and rhythm, no murmur noted Respiratory: clear to auscultation bilaterally  Neurological examination  Mentation: Alert oriented to time, place, history taking. Follows all commands speech and language fluent Cranial nerve II-XII: Pupils were equal round reactive to light. Extraocular movements were full, visual field were full  Motor: The motor testing reveals 5 over 5 strength of all 4 extremities. Good symmetric motor tone is noted throughout.  Gait and station: Gait is normal.    DIAGNOSTIC DATA (LABS, IMAGING, TESTING) - I  reviewed patient records, labs, notes, testing and imaging myself where available.      No data to display           Lab Results  Component Value Date   WBC  12.4 (H) 10/01/2018   HGB 16.3 10/01/2018   HCT 48.4 10/01/2018   MCV 96.4 10/01/2018   PLT 200 10/01/2018      Component Value Date/Time   NA 135 10/01/2018 1624   K 4.0 10/01/2018 1624   CL 100 10/01/2018 1624   CO2 25 10/01/2018 1624   GLUCOSE 102 (H) 10/01/2018 1624   BUN 17 10/01/2018 1624   CREATININE 1.47 (H) 10/01/2018 1624   CALCIUM 8.9 10/01/2018 1624   GFRNONAA 48 (L) 10/01/2018 1624   GFRAA 56 (L) 10/01/2018 1624   No results found for: "CHOL", "HDL", "LDLCALC", "LDLDIRECT", "TRIG", "CHOLHDL" No results found for: "HGBA1C" No results found for: "VITAMINB12" No results found for: "TSH"   ASSESSMENT AND PLAN 71 y.o. year old male  has a past medical history of Anal fissure, Anxiety, Aortic atherosclerosis ([place]) (04/03/2021), Convulsions ([place]), Coronary artery calcification seen on CT scan (04/03/2021), Diverticulitis, Heart murmur, Hiatal hernia with gastroesophageal reflux, HLD (hyperlipidemia), Moderate to severe mitral regurgitation (10/06/2020), MVP (mitral valve prolapse), Normal cardiac stress test (January 2014), OSA on CPAP (11/11/2013), Sleep apnea, Tinnitus, Tobacco abuse, Tobacco use disorder (03/31/2014), and White coat hypertension. here with     ICD-10-CM   1. OSA on CPAP  G47.33         Michael Michael [name] is doing well on CPAP therapy. Compliance report reveals excellent compliance. He was encouraged to continue using CPAP nightly and for greater than 4 hours each night. He will monitor for leak at home. May consider mask refitting if needed. We will update supply orders as indicated. Risks of untreated sleep apnea review and education materials provided. Healthy lifestyle habits encouraged. He will follow up in 1 year, sooner if needed. He verbalizes understanding and agreement with this plan.     No orders of the defined types were placed in this encounter.    No orders of the defined types were placed in this encounter.     Debbora Presto, FNP-C 11/13/2021, 4:25 PM [facility] [address], [place], [place] [address] [phone]

## 2021-11-15 ENCOUNTER — Ambulatory Visit: Payer: Medicare Other | Admitting: Family Medicine

## 2021-11-15 ENCOUNTER — Encounter: Payer: Self-pay | Admitting: Family Medicine

## 2021-11-15 VITALS — BP 137/72 | HR 48 | Ht 71.0 in | Wt 226.5 lb

## 2021-11-15 DIAGNOSIS — G4733 Obstructive sleep apnea (adult) (pediatric): Secondary | ICD-10-CM | POA: Diagnosis not present

## 2021-11-16 ENCOUNTER — Telehealth: Payer: Self-pay

## 2022-06-21 ENCOUNTER — Other Ambulatory Visit: Payer: Self-pay | Admitting: Internal Medicine

## 2022-06-21 DIAGNOSIS — F17200 Nicotine dependence, unspecified, uncomplicated: Secondary | ICD-10-CM

## 2022-07-25 ENCOUNTER — Ambulatory Visit
Admission: RE | Admit: 2022-07-25 | Discharge: 2022-07-25 | Disposition: A | Discharge status: Home / Self Care | Payer: Medicare Other | Source: Ambulatory Visit | Attending: Internal Medicine | Admitting: Internal Medicine

## 2022-07-25 DIAGNOSIS — F17200 Nicotine dependence, unspecified, uncomplicated: Secondary | ICD-10-CM

## 2022-08-01 ENCOUNTER — Telehealth: Payer: Self-pay | Admitting: *Deleted

## 2022-08-01 NOTE — Telephone Encounter (Signed)
   Pre-operative Risk Assessment    Patient Name: Michael [name]  DOB: August 25, 1950 MRN: [id number]      Request for Surgical Clearance    Procedure:   Colonoscopy  Date of Surgery:  Clearance 08/28/22                                 Surgeon:  Dr. Vida Rigger Surgeon's Group or Practice Name:  [facility] Phone number:  [phone] Fax number:  [phone]   Type of Clearance Requested:   - Medical  - Pharmacy:  Hold Aspirin Not Indicated   Type of Anesthesia:   Propofol   Additional requests/questions:  Pt has upcoming appt with Otilio Saber, PA on June 26. Pre op added to appt notes.   Signed, Emmit Pomfret   08/01/2022, 10:42 AM

## 2022-08-01 NOTE — Telephone Encounter (Signed)
   Name: Michael [name]  DOB: 29-Jul-1950  MRN: [id number]  Primary Cardiologist: Kristeen Miss, MD  Chart reviewed as part of pre-operative protocol coverage. The patient has an upcoming visit scheduled with Otilio Saber, PA on 6/26/2024at which time clearance can be addressed in case there are any issues that would impact surgical recommendations.  Colonoscopy is not scheduled until 08/28/2022 as below. I added preop FYI to appointment note so that provider is aware to address at time of outpatient visit.  Per office protocol the cardiology provider should forward their finalized clearance decision and recommendations regarding antiplatelet therapy to the requesting party below.     I will route this message as FYI to requesting party and remove this message from the preop box as separate preop APP input not needed at this time.   Please call with any questions.  Joylene Grapes, NP  08/01/2022, 11:58 AM

## 2022-08-08 ENCOUNTER — Encounter: Payer: Self-pay | Admitting: Student

## 2022-08-08 ENCOUNTER — Ambulatory Visit: Payer: Medicare Other | Attending: Student | Admitting: Student

## 2022-08-08 VITALS — BP 126/64 | HR 48 | Ht 71.0 in | Wt 231.2 lb

## 2022-08-08 DIAGNOSIS — Z01810 Encounter for preprocedural cardiovascular examination: Secondary | ICD-10-CM

## 2022-08-08 DIAGNOSIS — I340 Nonrheumatic mitral (valve) insufficiency: Secondary | ICD-10-CM | POA: Diagnosis not present

## 2022-08-08 DIAGNOSIS — R001 Bradycardia, unspecified: Secondary | ICD-10-CM | POA: Diagnosis not present

## 2022-08-08 NOTE — Progress Notes (Signed)
  Electrophysiology Office Note:   Date:  08/08/2022  ID:  GOERGE [name], DOB January 01, 1951, MRN [id number]  Primary Cardiologist: Kristeen Miss, MD Electrophysiologist: Lanier Prude, MD      History of Present Illness:   Michael [name] is a 72 y.o. male with h/o sinus bradycardia and mitral valve disease seen today for cardiac clearance for colonoscopy   Since last being seen in our clinic the patient reports doing well. Denies any issues with exercise. States HR "comes up" but that he hasn't actually measured. Denies any exercise intolerance.  he denies chest pain, palpitations, dyspnea, PND, orthopnea, nausea, vomiting, dizziness, syncope, edema, weight gain, or early satiety.  Review of systems complete and found to be negative unless listed in HPI.   Studies Reviewed:    EKG is ordered today. Personal review as below.  EKG Interpretation  Date/Time:  Wednesday August 08 2022 09:07:53 EDT Ventricular Rate:  43 PR Interval:  186 QRS Duration: 116 QT Interval:  516 QTC Calculation: 436 R Axis:   -44 Text Interpretation: Marked sinus bradycardia Left axis deviation  Stable from previous. Confirmed by Maxine Glenn [phone]) on 08/08/2022 9:19:42 AM       Physical Exam:   VS:  BP 126/64   Pulse (!) 48   Ht 5\' 11"  (1.803 m)   Wt 231 lb 3.2 oz (104.9 kg)   SpO2 95%   BMI 32.25 kg/m    Wt Readings from Last 3 Encounters:  08/08/22 231 lb 3.2 oz (104.9 kg)  11/15/21 226 lb 8 oz (102.7 kg)  09/26/21 226 lb 9.6 oz (102.8 kg)     GEN: Well nourished, well developed in no acute distress NECK: No JVD; No carotid bruits CARDIAC: Slow but regular rate and rhythm, 2/6 MR  RESPIRATORY:  Clear to auscultation without rales, wheezing or rhonchi  ABDOMEN: Soft, non-tender, non-distended EXTREMITIES:  No edema; No deformity   ASSESSMENT AND PLAN:    Sinus bradycardia Chronic, stable and asymptomatic No s/s of hemodynamically unstable bradycardia Denies exercise  intolerance  Mitral valve prolapse Mod to Severe MR Echo 09/2021 LVEF 60-65%, grade 2 DD, Mod/Severe MR with moderate prolapse of the posterior leaflet  Cardiac clearance for Colonoscopy The patient is cleared for surgery from a cardiac perspective with an estimated Low Risk of perioperative cardiac complications by the Revised Cardiac Risk Index Nedra Hai Criteria). The patient may proceed without further cardiac work up.   The patient does not have an implanted cardiac device.   Follow up with Dr. Lalla Brothers in 12 months  Signed, Graciella Freer, PA-C

## 2022-08-08 NOTE — Patient Instructions (Signed)
Medication Instructions:  Your physician recommends that you continue on your current medications as directed. Please refer to the Current Medication list given to you today.  *If you need a refill on your cardiac medications before your next appointment, please call your pharmacy*   Lab Work: None ordered If you have labs (blood work) drawn today and your tests are completely normal, you will receive your results only by: MyChart Message (if you have MyChart) OR A paper copy in the mail If you have any lab test that is abnormal or we need to change your treatment, we will call you to review the results   Follow-Up: At [facility], you and your health needs are our priority.  As part of our continuing mission to provide you with exceptional heart care, we have created designated Provider Care Teams.  These Care Teams include your primary Cardiologist (physician) and Advanced Practice Providers (APPs -  Physician Assistants and Nurse Practitioners) who all work together to provide you with the care you need, when you need it.  Your next appointment:   1 year(s)  Provider:   Cameron Lambert, MD  

## 2022-09-23 NOTE — Progress Notes (Addendum)
Cardiology Office Note:    Date:  09/24/2022   ID:  Michael [name], DOB 28-Aug-1950, MRN [id number]  PCP:  [name], MD   [facility] Providers Cardiologist:   Janaya Broy   Electrophysiologist:  Lanier Prude, MD     Referring MD: [name], MD   Chief Complaint  Patient presents with   Mitral Regurgitation     Aug. 25, 2022    Michael [name] is a 72 y.o. male with a hx of MVP , OSA, and bradycardia. He has seen Dr. Lalla Brothers for follow up of his bradycardia. Is active,  Exercises regularly Retired from [employer]   Has MVP wit mod - severe MR now  Was mild MR on his last echo in 2014 Has mild LAE  Encourage weight loss , BP control  Was on bystolic but this caused bradycardia  Dr. Waynard Edwards tried Zetia - did not tolerate it  Is on simva I think he may do better on rosuvastatin  since Simvastatin has so many drug interactions .   Will defer to Dr. Waynard Edwards on this issue for now .  No worsening dyspnea Drinks lots of regular soda .  We discussed weight loss  September 26, 2021:  There is seen today for follow-up of his moderate to severe mitral regurgitation.   Hx of MVP   He has a history of hyperlipidemia and has been on simvastatin.  He has tried Zetia but did not tolerate it.  No dysnea Has stopped smoking 3 months ago   Lipid levels from his primary medical doctor in April, 2023 reveals a total cholesterol of 122 HDL is 35 LDL is 66 Triglyceride level is 104 Hemoglobin A1c is 5.5 Hemoglobin is 17.6.    Aug. 12, 2024 Michael [name] is seen for follow up of his moderate - severe MR , MVP  Hx of endocarditis in the past  Last echo was 1 year Walks regularly ,  no additional dyspnea    Past Medical History:  Diagnosis Date   Anal fissure    Anxiety    Aortic atherosclerosis (HCC) 04/03/2021   CT in 6/21   Convulsions (HCC)    AS A CHILD   Coronary artery calcification seen on CT scan 04/03/2021   Chest CT in 6/21   Diverticulitis    Heart  murmur    Hiatal hernia with gastroesophageal reflux    HLD (hyperlipidemia)    Moderate to severe mitral regurgitation 10/06/2020   Echo 09/2021: EF 60-65, no RWMA, mild LVH, GRII DD, elevated LVEDP, normal RVSF, normal PASP, severe LAE, 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   B Complex CAPS Take 1 capsule by mouth daily.   cholecalciferol (VITAMIN D) 1000 UNITS tablet Take 1,000 Units by mouth daily.   FIBER COMPLETE PO Take 1 tablet  by mouth daily.    fluticasone (FLONASE) 50 MCG/ACT nasal spray two sprays per nostril daily prn   loratadine (CLARITIN) 10 MG tablet Take 10 mg by mouth as needed for allergies.   losartan (COZAAR) 25 MG tablet Take 50 mg by mouth at bedtime.    pantoprazole (PROTONIX) 40 MG tablet Take 40 mg by mouth daily.   Probiotic Product (DAILY PROBIOTIC PO) Take by mouth.   sertraline (ZOLOFT) 50 MG tablet Take 50 mg by mouth daily.   simvastatin (ZOCOR) 20 MG tablet Take 20 mg by mouth at bedtime.   tamsulosin (FLOMAX) 0.4 MG CAPS capsule Take 0.4 mg by mouth daily.     Allergies:   Patient has no known allergies.   Social History   Socioeconomic History   Marital status: Married    Spouse name: Not on file   Number of children: 0   Years of education: HS   Highest education level: Not on file  Occupational History   Not on file  Tobacco Use   Smoking status: Heavy Smoker    Current packs/day: 0.00    Average  packs/day: 0.5 packs/day for 44.9 years (22.5 ttl pk-yrs)    Types: Cigarettes    Start date: 04/12/1967    Last attempt to quit: 03/06/2012    Years since quitting: 10.5   Smokeless tobacco: Never  Vaping Use   Vaping status: Never Used  Substance and Sexual Activity   Alcohol use: No    Alcohol/week: 0.0 standard drinks of alcohol   Drug use: No   Sexual activity: Yes  Other Topics Concern   Not on file  Social History Narrative   Patient is married, does not have children.   Patient is right handed.   Patient has high school education.   Patient does not drink caffeine.   Social Determinants of Health   Financial Resource Strain: Not on file  Food Insecurity: Not on file  Transportation Needs: Not on file  Physical Activity: Not on file  Stress: Not on file  Social Connections: Not on file     Family History: The patient's family history includes Cancer in his mother; Cancer (age of onset: 51) in an other family member; Hypertension in an other family member; Sleep apnea in his brother.  ROS:   Please see the history of present illness.     All other systems reviewed and are negative.  EKGs/Labs/Other Studies Reviewed:    The following studies were reviewed today:     Recent Labs: No results found for requested labs within last 365 days.  Recent Lipid Panel No results found for: "CHOL", "TRIG", "HDL", "CHOLHDL", "VLDL", "LDLCALC", "LDLDIRECT"   Risk Assessment/Calculations:           Physical Exam:     Physical Exam: Blood pressure 130/60, pulse (!) 45, height 5\' 11"  (1.803 m), weight 228 lb (103.4 kg), SpO2 97%.       GEN:  Well nourished, well developed in no acute distress HEENT: Normal NECK: No JVD; No carotid bruits LYMPHATICS: No lymphadenopathy CARDIAC: RRR2-3/6 systolic murmur at L ax line .    RESPIRATORY:  Clear to auscultation without rales, wheezing or rhonchi  ABDOMEN: Soft, non-tender, non-distended MUSCULOSKELETAL:  No edema; No  deformity  SKIN: Warm and dry NEUROLOGIC:  Alert and oriented x 3   EKG:    August 08, 2022: Sinus bradycardia 43.  No ST or T wave changes.    ASSESSMENT:    1. HTN,  goal below 130/80   2. Moderate to severe mitral regurgitation   3. MVP (mitral valve prolapse)   4. Pure hypercholesterolemia     PLAN:      MVP with mod- severe MR.    -   His valve sounds about the same.  Will repeat his echocardiogram.  Will need to keep a close eye on his valve to monitor his mitral regurgitation as well as his left ventricular function and size..  Fortunately, he is not having any symptoms of shortness of breath.  2.  Hyperlipidemia:  labs managed by Dr. Waynard Edwards   3.  Moderate obesity:  encouraged weight loss         Medication Adjustments/Labs and Tests Ordered: Current medicines are reviewed at length with the patient today.  Concerns regarding medicines are outlined above.  Orders Placed This Encounter  Procedures   ECHOCARDIOGRAM COMPLETE   No orders of the defined types were placed in this encounter.   Patient Instructions  Medication Instructions:  Your physician recommends that you continue on your current medications as directed. Please refer to the Current Medication list given to you today.  *If you need a refill on your cardiac medications before your next appointment, please call your pharmacy*   Lab Work: NONE If you have labs (blood work) drawn today and your tests are completely normal, you will receive your results only by: MyChart Message (if you have MyChart) OR A paper copy in the mail If you have any lab test that is abnormal or we need to change your treatment, we will call you to review the results.   Testing/Procedures: ECHO Your physician has requested that you have an echocardiogram. Echocardiography is a painless test that uses sound waves to create images of your heart. It provides your doctor with information about the size and shape of your heart  and how well your heart's chambers and valves are working. This procedure takes approximately one hour. There are no restrictions for this procedure. Please do NOT wear cologne, perfume, aftershave, or lotions (deodorant is allowed). Please arrive 15 minutes prior to your appointment time. ch  Follow-Up: At [facility], you and your health needs are our priority.  As part of our continuing mission to provide you with exceptional heart care, we have created designated Provider Care Teams.  These Care Teams include your primary Cardiologist (physician) and Advanced Practice Providers (APPs -  Physician Assistants and Nurse Practitioners) who all work together to provide you with the care you need, when you need it.  Your next appointment:   1 year(s)  Provider:   Kristeen Miss, MD        Signed, Kristeen Miss, MD  09/24/2022 6:54 PM    [facility]   Addendum 10/31/22  Echo reveals severe MR with flail leaflet. Dr. Jacques Navy read the echo and recommends a TEE   We have scheduled Michael [name] for a TEE . We have discussed risks, benefits, He understands and agrees to proceed    Kristeen Miss, MD  10/31/2022 12:26 PM    [facility] [address] [place],  [address] [place], [place]  [address] Phone: [phone]; Fax: [phone]

## 2022-09-24 ENCOUNTER — Ambulatory Visit: Payer: Medicare Other | Admitting: Cardiovascular Disease

## 2022-09-24 ENCOUNTER — Encounter: Payer: Self-pay | Admitting: Cardiovascular Disease

## 2022-09-24 VITALS — BP 130/60 | HR 45 | Ht 71.0 in | Wt 228.0 lb

## 2022-09-24 DIAGNOSIS — I340 Nonrheumatic mitral (valve) insufficiency: Secondary | ICD-10-CM | POA: Diagnosis not present

## 2022-09-24 DIAGNOSIS — I10 Essential (primary) hypertension: Secondary | ICD-10-CM | POA: Diagnosis not present

## 2022-09-24 DIAGNOSIS — E7800 Pure hypercholesterolemia, unspecified: Secondary | ICD-10-CM

## 2022-09-24 DIAGNOSIS — I341 Nonrheumatic mitral (valve) prolapse: Secondary | ICD-10-CM

## 2022-09-24 NOTE — Patient Instructions (Signed)
Medication Instructions:  Your physician recommends that you continue on your current medications as directed. Please refer to the Current Medication list given to you today.  *If you need a refill on your cardiac medications before your next appointment, please call your pharmacy*   Lab Work: NONE If you have labs (blood work) drawn today and your tests are completely normal, you will receive your results only by: MyChart Message (if you have MyChart) OR A paper copy in the mail If you have any lab test that is abnormal or we need to change your treatment, we will call you to review the results.   Testing/Procedures: ECHO Your physician has requested that you have an echocardiogram. Echocardiography is a painless test that uses sound waves to create images of your heart. It provides your doctor with information about the size and shape of your heart and how well your heart's chambers and valves are working. This procedure takes approximately one hour. There are no restrictions for this procedure. Please do NOT wear cologne, perfume, aftershave, or lotions (deodorant is allowed). Please arrive 15 minutes prior to your appointment time. ch  Follow-Up: At [facility], you and your health needs are our priority.  As part of our continuing mission to provide you with exceptional heart care, we have created designated Provider Care Teams.  These Care Teams include your primary Cardiologist (physician) and Advanced Practice Providers (APPs -  Physician Assistants and Nurse Practitioners) who all work together to provide you with the care you need, when you need it.  Your next appointment:   1 year(s)  Provider:   Kristeen Miss, MD

## 2022-10-09 ENCOUNTER — Ambulatory Visit (HOSPITAL_COMMUNITY): Payer: Medicare Other

## 2022-10-25 ENCOUNTER — Ambulatory Visit (HOSPITAL_COMMUNITY): Payer: Medicare Other | Attending: Internal Medicine

## 2022-10-25 DIAGNOSIS — I341 Nonrheumatic mitral (valve) prolapse: Secondary | ICD-10-CM | POA: Diagnosis not present

## 2022-10-25 DIAGNOSIS — I10 Essential (primary) hypertension: Secondary | ICD-10-CM | POA: Insufficient documentation

## 2022-10-25 DIAGNOSIS — E7800 Pure hypercholesterolemia, unspecified: Secondary | ICD-10-CM | POA: Diagnosis not present

## 2022-10-25 DIAGNOSIS — I340 Nonrheumatic mitral (valve) insufficiency: Secondary | ICD-10-CM | POA: Insufficient documentation

## 2022-10-25 LAB — ECHOCARDIOGRAM COMPLETE
Area-P 1/2: 2.28 cm2
MV M vel: 5.12 m/s
MV Peak grad: 104.9 mmHg
Radius: 0.8 cm
S' Lateral: 3 cm

## 2022-10-29 ENCOUNTER — Telehealth: Payer: Self-pay

## 2022-10-29 ENCOUNTER — Telehealth: Payer: Self-pay | Admitting: Cardiovascular Disease

## 2022-10-29 DIAGNOSIS — Z01812 Encounter for preprocedural laboratory examination: Secondary | ICD-10-CM

## 2022-10-29 NOTE — Telephone Encounter (Signed)
Called and spoke with patient who understands and agrees to TEE procedure. Scheduled with Jacques Navy on 11/14/22. Pt scheduled for labs and EKG to be completed as a nurse visit on 11/02/22. Dr Elease Hashimoto to update H & P prior to procedure.

## 2022-10-29 NOTE — Telephone Encounter (Signed)
-----   Message from Kristeen Miss sent at 10/27/2022  8:05 AM EDT ----- Normal LV function with EF 55-60%  MR is now severe .  Suspect P2 prolapse There is reversal of flow in the pulm veins Please schedule for TEE with Dr. Jacques Navy.   I  have spoke to her and her next TEE date appears to be Oct. 2.

## 2022-10-29 NOTE — Telephone Encounter (Signed)
Returned call to patient- states all questions were addressed in our earlier conversation (TEE scheduled)

## 2022-10-29 NOTE — Telephone Encounter (Signed)
Patient is requesting call back to discuss results. Please advise.

## 2022-11-02 ENCOUNTER — Ambulatory Visit: Payer: Medicare Other

## 2022-11-02 ENCOUNTER — Other Ambulatory Visit: Payer: Self-pay

## 2022-11-02 ENCOUNTER — Ambulatory Visit: Payer: Medicare Other | Attending: Cardiovascular Disease | Admitting: *Deleted

## 2022-11-02 VITALS — BP 150/80 | HR 42 | Ht 71.0 in | Wt 228.0 lb

## 2022-11-02 DIAGNOSIS — I340 Nonrheumatic mitral (valve) insufficiency: Secondary | ICD-10-CM | POA: Diagnosis not present

## 2022-11-02 DIAGNOSIS — Z01810 Encounter for preprocedural cardiovascular examination: Secondary | ICD-10-CM | POA: Diagnosis not present

## 2022-11-02 DIAGNOSIS — Z01812 Encounter for preprocedural laboratory examination: Secondary | ICD-10-CM

## 2022-11-02 NOTE — Progress Notes (Signed)
Nurse Visit   Date of Encounter: 11/02/2022 ID: TRACY PASLEY, DOB 04-07-1950, MRN [id number]  PCP:  [name], MD    HeartCare Providers Cardiologist:  Kristeen Miss, MD Electrophysiologist:  Lanier Prude, MD      Visit Details   VS:  BP (!) 150/80   Pulse (!) 42   Ht 5\' 11"  (1.803 m)   Wt 228 lb (103.4 kg)   BMI 31.80 kg/m  , BMI Body mass index is 31.8 kg/m.  Wt Readings from Last 3 Encounters:  11/02/22 228 lb (103.4 kg)  09/24/22 228 lb (103.4 kg)  08/08/22 231 lb 3.2 oz (104.9 kg)     Reason for visit: EKG nurse visit for upcoming TEE on 11/14/22 pt also went to the lab today for pre-procedure labs (BMET/CBC)  nurse visit requested by Dr. Alisa Graff Performed today: Vitals, EKG, Provider consulted:[facility] Dr. Lynnette Caffey, and Education Changes (medications, testing, etc.) : no changes made.  Pt to proceed with scheduled TEE on 11/14/22 at 10 am with Dr. Jacques Navy to do, ordered by Dr. Elease Hashimoto Length of Visit: 30 minutes    Medications Adjustments/Labs and Tests Ordered: Orders Placed This Encounter  Procedures   EKG 12-Lead   No orders of the defined types were placed in this encounter.    Achilles Dunk, LPN  5/36/6440 3:47 PM

## 2022-11-02 NOTE — Patient Instructions (Addendum)
Medication Instructions:   Your physician recommends that you continue on your current medications as directed. Please refer to the Current Medication list given to you today.  *If you need a refill on your cardiac medications before your next appointment, please call your pharmacy*     Testing/Procedures:  Your physician has requested that you have a TEE. During a TEE, sound waves are used to create images of your heart. It provides your doctor with information about the size and shape of your heart and how well your heart's chambers and valves are working. In this test, a transducer is attached to the end of a flexible tube that's guided down your throat and into your esophagus (the tube leading from you mouth to your stomach) to get a more detailed image of your heart. You are not awake for the procedure. Please see the instruction sheet given to you today. For further information please visit [email or web address]   You are scheduled for a TEE on Wednesday, November 14, 2022 with Dr. Jacques Navy.  Please arrive at the [facility] (Main Entrance A) at [facility]: [address] [place], [place] [address] at 10:00am. (1 hour prior to procedure)  DIET: Nothing to eat or drink after midnight except a sip of water with medications   Labs: to be completed 11/02/22 @ [facility] office Come to the lab at [facility] between the hours of 7:15 am and 4:30 pm. You do not have to be fasting.  You must have a responsible person to drive you home and stay in the waiting area during your procedure. Failure to do so could result in cancellation.  Bring your insurance cards.  *Special Note: Every effort is made to have your procedure done on time. Occasionally there are emergencies that occur at the hospital that may cause delays. Please be patient if a delay does occur.       Last read by Greggory Keen Vullo at  7:25 AM on 11/02/2022.    Follow-Up:   As scheduled with Dr. Elease Hashimoto

## 2022-11-03 LAB — CBC
Hematocrit: 47.5 % (ref 37.5–51.0)
Hemoglobin: 15.6 g/dL (ref 13.0–17.7)
MCH: 31.6 pg (ref 26.6–33.0)
MCHC: 32.8 g/dL (ref 31.5–35.7)
MCV: 96 fL (ref 79–97)
Platelets: 235 10*3/uL (ref 150–450)
RBC: 4.94 x10E6/uL (ref 4.14–5.80)
RDW: 12.3 % (ref 11.6–15.4)
WBC: 9.7 10*3/uL (ref 3.4–10.8)

## 2022-11-03 LAB — BASIC METABOLIC PANEL
BUN/Creatinine Ratio: 10 (ref 10–24)
BUN: 14 mg/dL (ref 8–27)
CO2: 24 mmol/L (ref 20–29)
Calcium: 9.5 mg/dL (ref 8.6–10.2)
Chloride: 106 mmol/L (ref 96–106)
Creatinine, Ser: 1.41 mg/dL — ABNORMAL HIGH (ref 0.76–1.27)
Glucose: 83 mg/dL (ref 70–99)
Potassium: 4.3 mmol/L (ref 3.5–5.2)
Sodium: 143 mmol/L (ref 134–144)
eGFR: 53 mL/min/{1.73_m2} — ABNORMAL LOW (ref >59)

## 2022-11-13 NOTE — Progress Notes (Signed)
Spoke to patient on phone regarding procedure tomorrow.  Instructed to arrive at 0900 am , NPO after midnight.  Confirmed patient has a ride home and responsible person to stay with patient for 24 hours after the procedure.  Take morning medications with sip of water.

## 2022-11-14 ENCOUNTER — Ambulatory Visit (HOSPITAL_COMMUNITY): Payer: Medicare Other | Admitting: Anesthesiology

## 2022-11-14 ENCOUNTER — Encounter (HOSPITAL_COMMUNITY)
Admission: RE | Disposition: A | Discharge status: Home / Self Care | Payer: Self-pay | Source: Home / Self Care | Attending: Internal Medicine

## 2022-11-14 ENCOUNTER — Ambulatory Visit (HOSPITAL_COMMUNITY)
Admission: RE | Admit: 2022-11-14 | Discharge: 2022-11-14 | Disposition: A | Discharge status: Home / Self Care | Payer: Medicare Other | Source: Ambulatory Visit | Attending: Internal Medicine | Admitting: Internal Medicine

## 2022-11-14 ENCOUNTER — Ambulatory Visit (HOSPITAL_COMMUNITY)
Admission: RE | Admit: 2022-11-14 | Discharge: 2022-11-14 | Disposition: A | Discharge status: Home / Self Care | Payer: Medicare Other | Attending: Internal Medicine | Admitting: Internal Medicine

## 2022-11-14 DIAGNOSIS — Z87891 Personal history of nicotine dependence: Secondary | ICD-10-CM | POA: Insufficient documentation

## 2022-11-14 DIAGNOSIS — G4733 Obstructive sleep apnea (adult) (pediatric): Secondary | ICD-10-CM | POA: Diagnosis not present

## 2022-11-14 DIAGNOSIS — E669 Obesity, unspecified: Secondary | ICD-10-CM | POA: Diagnosis not present

## 2022-11-14 DIAGNOSIS — I340 Nonrheumatic mitral (valve) insufficiency: Secondary | ICD-10-CM | POA: Diagnosis not present

## 2022-11-14 DIAGNOSIS — I059 Rheumatic mitral valve disease, unspecified: Secondary | ICD-10-CM

## 2022-11-14 DIAGNOSIS — Z79899 Other long term (current) drug therapy: Secondary | ICD-10-CM | POA: Diagnosis not present

## 2022-11-14 DIAGNOSIS — Q2112 Patent foramen ovale: Secondary | ICD-10-CM | POA: Diagnosis not present

## 2022-11-14 DIAGNOSIS — R001 Bradycardia, unspecified: Secondary | ICD-10-CM | POA: Insufficient documentation

## 2022-11-14 DIAGNOSIS — I341 Nonrheumatic mitral (valve) prolapse: Secondary | ICD-10-CM | POA: Diagnosis present

## 2022-11-14 DIAGNOSIS — I700 Atherosclerosis of aorta: Secondary | ICD-10-CM | POA: Insufficient documentation

## 2022-11-14 DIAGNOSIS — E785 Hyperlipidemia, unspecified: Secondary | ICD-10-CM | POA: Diagnosis not present

## 2022-11-14 DIAGNOSIS — Z6832 Body mass index (BMI) 32.0-32.9, adult: Secondary | ICD-10-CM | POA: Diagnosis not present

## 2022-11-14 HISTORY — PX: TEE WITHOUT CARDIOVERSION: SHX5443

## 2022-11-14 LAB — ECHO TEE
MV M vel: 4.74 m/s
MV Peak grad: 89.9 mm[Hg]
Radius: 1.8 cm

## 2022-11-14 SURGERY — ECHOCARDIOGRAM, TRANSESOPHAGEAL
Anesthesia: Monitor Anesthesia Care

## 2022-11-14 MED ORDER — BUTAMBEN-TETRACAINE-BENZOCAINE 2-2-14 % EX AERO: 2.0000 | INHALATION_SPRAY | Freq: Once | CUTANEOUS | Status: DC

## 2022-11-14 MED ORDER — PROPOFOL 500 MG/50ML IV EMUL
INTRAVENOUS | Status: DC | PRN
  Administered 2022-11-14: 125 ug/kg/min via INTRAVENOUS

## 2022-11-14 MED ORDER — BUTAMBEN-TETRACAINE-BENZOCAINE 2-2-14 % EX AERO
INHALATION_SPRAY | CUTANEOUS | Status: AC
  Filled 2022-11-14: qty 20

## 2022-11-14 MED ORDER — SODIUM CHLORIDE 0.9 % IV SOLN
INTRAVENOUS | Status: DC
  Administered 2022-11-14: 20 mL/h via INTRAVENOUS

## 2022-11-14 MED ORDER — LIDOCAINE 2% (20 MG/ML) 5 ML SYRINGE
INTRAMUSCULAR | Status: DC | PRN
  Administered 2022-11-14: 20 mg via INTRAVENOUS

## 2022-11-14 MED ORDER — EPHEDRINE SULFATE (PRESSORS) 50 MG/ML IJ SOLN
INTRAMUSCULAR | Status: DC | PRN
Start: 2022-11-14 — End: 2022-11-14
  Administered 2022-11-14: 10 mg via INTRAVENOUS

## 2022-11-14 MED ORDER — PROPOFOL 10 MG/ML IV BOLUS
INTRAVENOUS | Status: DC | PRN
  Administered 2022-11-14: 50 mg via INTRAVENOUS

## 2022-11-14 MED ORDER — ONDANSETRON HCL 4 MG/2ML IJ SOLN
INTRAMUSCULAR | Status: DC | PRN
  Administered 2022-11-14: 4 mg via INTRAVENOUS

## 2022-11-14 NOTE — CV Procedure (Signed)
INDICATIONS: MR  PROCEDURE:   Informed consent was obtained prior to the procedure. The risks, benefits and alternatives for the procedure were discussed and the patient comprehended these risks.  Risks include, but are not limited to, cough, sore throat, vomiting, nausea, somnolence, esophageal and stomach trauma or perforation, bleeding, low blood pressure, aspiration, pneumonia, infection, trauma to the teeth and death.    After a procedural time-out, the oropharynx was anesthetized with 20% benzocaine spray.   During this procedure the patient was administered propofol per anesthesia.  The patient's heart rate, blood pressure, and oxygen saturation were monitored continuously during the procedure. The period of conscious sedation was 25 minutes, of which I was present face-to-face 100% of this time.  The transesophageal probe was inserted in the esophagus and stomach without difficulty and multiple views were obtained.  The patient was kept under observation until the patient left the procedure room.  The patient left the procedure room in stable condition.   Agitated microbubble saline contrast was administered.  COMPLICATIONS:    There were no immediate complications.  FINDINGS:   FORMAL ECHOCARDIOGRAM REPORT PENDING Normal biventricular function. Flail P1 scallop of posterior mitral leaflet with severe eccentric MR.   RECOMMENDATIONS:     Consider structural heart team review/TCTS consult.  Time Spent Directly with the Patient:  45 minutes   Parke Poisson 11/14/2022, 9:19 AM

## 2022-11-14 NOTE — Discharge Instructions (Signed)
Transesophageal Echocardiogram Transesophageal echocardiogram (TEE) is a test that uses sound waves to take pictures of your heart. TEE is done by passing a small probe attached to a flexible tube down the part of the body that moves food from your mouth to your stomach (esophagus). The pictures give clear images of your heart. This can help your doctor see if there are problems with your heart. Tell a doctor about: Any allergies you have. All medicines you are taking. This includes vitamins, herbs, eye drops, creams, and over-the-counter medicines. Any problems you or family members have had with anesthetic medicines. Any blood disorders you have. Any surgeries you have had. Any medical conditions you have. Any swallowing problems. Whether you have or have had a blockage in the part of the body that moves food from your mouth to your stomach. Whether you are pregnant or may be pregnant. What are the risks? In general, this is a safe procedure. But, problems may occur, such as: Damage to nearby structures or organs. A tear in the part of the body that moves food from your mouth to your stomach. Irregular heartbeat. Hoarse voice or trouble swallowing. Bleeding. What happens before the procedure? Medicines Ask your doctor about changing or stopping: Your normal medicines. Vitamins, herbs, and supplements. Over-the-counter medicines. Do not take aspirin or ibuprofen unless you are told to. General instructions Follow instructions from your doctor about what you cannot eat or drink. You will take out any dentures or dental retainers. Plan to have a responsible adult take you home from the hospital or clinic. Plan to have a responsible adult care for you for the time you are told after you leave the hospital or clinic. This is important. What happens during the procedure?  An IV will be put into one of your veins. You may be given: A sedative. This medicine helps you relax. A medicine  to numb the back of your throat. This may be sprayed or gargled. Your blood pressure, heart rate, and breathing will be watched. You may be asked to lie on your left side. A bite block will be placed in your mouth. This keeps you from biting the tube. The tip of the probe will be placed into the back of your mouth. You will be asked to swallow. Your doctor will take pictures of your heart. The probe and bite block will be taken out after the test is done. The procedure may vary among doctors and hospitals. What can I expect after the procedure? You will be monitored until you leave the hospital or clinic. This includes checking your blood pressure, heart rate, breathing rate, and blood oxygen level. Your throat may feel sore and numb. This will get better over time. You will not be allowed to eat or drink until the numbness has gone away. It is common to have a sore throat for a day or two. It is up to you to get the results of your procedure. Ask how to get your results when they are ready. Follow these instructions at home: If you were given a sedative during your procedure, do not drive or use machines until your doctor says that it is safe. Return to your normal activities when your doctor says that it is safe. Keep all follow-up visits. Summary TEE is a test that uses sound waves to take pictures of your heart. You will be given a medicine to help you relax. Do not drive or use machines until your doctor says that it  is safe. This information is not intended to replace advice given to you by your health care provider. Make sure you discuss any questions you have with your health care provider. Document Revised: 10/12/2020 Document Reviewed: 09/22/2019 Elsevier Patient Education  2024 [employer].

## 2022-11-14 NOTE — Anesthesia Postprocedure Evaluation (Signed)
Anesthesia Post Note  Patient: Michael [name]  Procedure(s) Performed: TRANSESOPHAGEAL ECHOCARDIOGRAM     Patient location during evaluation: Cath Lab Anesthesia Type: MAC Level of consciousness: awake and alert, patient cooperative and oriented Pain management: pain level controlled Vital Signs Assessment: post-procedure vital signs reviewed and stable Respiratory status: spontaneous breathing, nonlabored ventilation and respiratory function stable Cardiovascular status: stable and blood pressure returned to baseline Postop Assessment: no apparent nausea or vomiting Anesthetic complications: no   No notable events documented.  Last Vitals:  Vitals:   11/14/22 0935 11/14/22 0940  BP: 116/69 (!) 104/59  Pulse: (!) 49 (!) 48  Resp: 19 17  Temp:  36.6 C  SpO2: 93% 93%    Last Pain:  Vitals:   11/14/22 0940  TempSrc: Temporal  PainSc: 0-No pain                 Geraldy Akridge,E. Lakeishia Truluck

## 2022-11-14 NOTE — Transfer of Care (Signed)
Immediate Anesthesia Transfer of Care Note  Patient: Michael [name]  Procedure(s) Performed: TRANSESOPHAGEAL ECHOCARDIOGRAM  Patient Location: Cath Lab  Anesthesia Type:MAC  Level of Consciousness: awake, alert , oriented, patient cooperative, and responds to stimulation  Airway & Oxygen Therapy: Patient Spontanous Breathing and Patient connected to face mask oxygen  Post-op Assessment: Report given to RN and Post -op Vital signs reviewed and stable  Post vital signs: Reviewed and stable  Last Vitals:  Vitals Value Taken Time  BP 105/62 11/14/22 0925  Temp 36.6 C 11/14/22 0921  Pulse 49 11/14/22 0925  Resp 17 11/14/22 0925  SpO2 95 % 11/14/22 0925  Vitals shown include unfiled device data.  Last Pain:  Vitals:   11/14/22 0921  TempSrc: Temporal  PainSc: 0-No pain         Complications: No notable events documented.

## 2022-11-14 NOTE — H&P (Signed)
HPI: patient presents for [facility] with evidence of severe MR on TTE. I discussed this with Dr. Elease Hashimoto, see his documentation below, and we have coordinated TEE today.   GEN: No acute distress.   Neck: No JVD Cardiac: RRR, 3/6 systolic murmurs Respiratory: Clear to auscultation bilaterally. GI: Soft, nontender, non-distended  MS: No edema; No deformity. Neuro:  Nonfocal  Psych: Normal affect   A/P: proceed with TEE today. Labs reviewed. INFORMED CONSENT: After careful review of history and examination, the risks and benefits of transesophageal echocardiogram have been explained. Risks include esophageal damage, perforation (1:10,000 risk), bleeding, tooth fracture, pharyngeal hematoma as well as other potential complications associated with conscious sedation including aspiration, arrhythmia, respiratory failure and death. Alternatives to treatment were discussed, questions were answered. Patient is willing to proceed.   ----------------------------------------------------------------------   Cardiology Office Note:    Date:  11/14/2022   ID:  Michael [name], DOB Jun 28, 1950, MRN [id number]  PCP:  [name], MD   [facility] Providers Cardiologist:   Nahser   Electrophysiologist:  Lanier Prude, MD     Referring MD: No ref. provider found   No chief complaint on file.    Aug. 25, 2022    Michael [name] is a 72 y.o. male with a hx of MVP , OSA, and bradycardia. He has seen Dr. Lalla Brothers for follow up of his bradycardia. Is active,  Exercises regularly Retired from [employer]   Has MVP wit mod - severe MR now  Was mild MR on his last echo in 2014 Has mild LAE  Encourage weight loss , BP control  Was on bystolic but this caused bradycardia  Dr. Waynard Edwards tried Zetia - did not tolerate it  Is on simva I think he may do better on rosuvastatin  since Simvastatin has so many drug interactions .   Will defer to Dr. Waynard Edwards on this issue for now .  No worsening  dyspnea Drinks lots of regular soda .  We discussed weight loss  September 26, 2021:  There is seen today for follow-up of his moderate to severe mitral regurgitation.   Hx of MVP   He has a history of hyperlipidemia and has been on simvastatin.  He has tried Zetia but did not tolerate it.  No dysnea Has stopped smoking 3 months ago   Lipid levels from his primary medical doctor in April, 2023 reveals a total cholesterol of 122 HDL is 35 LDL is 66 Triglyceride level is 104 Hemoglobin A1c is 5.5 Hemoglobin is 17.6.    Aug. 12, 2024 Michael [name] is seen for follow up of his moderate - severe MR , MVP  Hx of endocarditis in the past  Last echo was 1 year Walks regularly ,  no additional dyspnea    Past Medical History:  Diagnosis Date   Anal fissure    Anxiety    Aortic atherosclerosis (HCC) 04/03/2021   CT in 6/21   Convulsions (HCC)    AS A CHILD   Coronary artery calcification seen on CT scan 04/03/2021   Chest CT in 6/21   Diverticulitis    Heart murmur    Hiatal hernia with gastroesophageal reflux    HLD (hyperlipidemia)    Moderate to severe mitral regurgitation 10/06/2020   Echo 09/2021: EF 60-65, no RWMA, mild LVH, GRII DD, elevated LVEDP, normal RVSF, normal PASP, severe LAE, 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   B Complex CAPS Take 1 capsule by mouth daily.   cholecalciferol (VITAMIN D) 1000 UNITS tablet Take 1,000 Units by  mouth daily.   FIBER COMPLETE PO Take 1 tablet by mouth daily.    fluticasone (FLONASE) 50 MCG/ACT nasal spray Place 2 sprays into both nostrils daily as needed for allergies.   loratadine (CLARITIN) 10 MG tablet Take 10 mg by mouth daily as needed for allergies.   losartan (COZAAR) 25 MG tablet Take 50 mg by mouth at bedtime.    pantoprazole (PROTONIX) 40 MG tablet Take 40 mg by mouth daily.   sertraline (ZOLOFT) 50 MG tablet Take 50 mg by mouth daily.   simvastatin (ZOCOR) 20 MG tablet Take 20 mg by mouth at bedtime.   tamsulosin (FLOMAX) 0.4 MG CAPS capsule Take 0.4 mg by mouth daily.     Allergies:   Patient has no known allergies.   Social History   Socioeconomic History   Marital status: Married    Spouse name: Not on file   Number of children: 0   Years of education: HS   Highest education level: Not on file  Occupational History   Not on file  Tobacco Use   Smoking status: Heavy Smoker    Current packs/day: 0.00    Average packs/day: 0.5 packs/day for 44.9 years (22.5 ttl pk-yrs)    Types: Cigarettes    Start date: 04/12/1967    Last attempt to quit: 03/06/2012    Years since quitting: 10.6   Smokeless tobacco: Never  Vaping Use   Vaping status: Never Used  Substance and Sexual Activity   Alcohol use: No    Alcohol/week: 0.0 standard drinks of alcohol   Drug use: No   Sexual activity: Yes  Other Topics Concern   Not on file  Social History Narrative   Patient is married, does not have children.   Patient is right handed.   Patient has high school education.   Patient does not drink caffeine.   Social Determinants of Health   Financial Resource Strain: Not on file  Food Insecurity: Not on file  Transportation Needs: Not on file  Physical Activity: Not on file  Stress: Not on file  Social Connections: Not on file     Family History: The patient's family history includes Cancer in his mother; Cancer (age of onset: 57) in an other family member;  Hypertension in an other family member; Sleep apnea in his brother.  ROS:   Please see the history of present illness.     All other systems reviewed and are negative.  EKGs/Labs/Other Studies Reviewed:    The following studies were reviewed today:     Recent Labs: 11/02/2022: BUN 14; Creatinine, Ser 1.41; Hemoglobin 15.6; Platelets 235; Potassium 4.3; Sodium 143  Recent Lipid Panel No results found for: "CHOL", "TRIG", "HDL", "CHOLHDL", "VLDL", "LDLCALC", "LDLDIRECT"   Risk Assessment/Calculations:  Physical Exam:     Physical Exam: Blood pressure 137/69, pulse (!) 44, temperature 97.9 F (36.6 C), resp. rate 14, height 5\' 11"  (1.803 m), weight 104.3 kg, SpO2 94%.       GEN:  Well nourished, well developed in no acute distress HEENT: Normal NECK: No JVD; No carotid bruits LYMPHATICS: No lymphadenopathy CARDIAC: RRR2-3/6 systolic murmur at L ax line .    RESPIRATORY:  Clear to auscultation without rales, wheezing or rhonchi  ABDOMEN: Soft, non-tender, non-distended MUSCULOSKELETAL:  No edema; No deformity  SKIN: Warm and dry NEUROLOGIC:  Alert and oriented x 3   EKG:    August 08, 2022: Sinus bradycardia 43.  No ST or T wave changes.    ASSESSMENT:    1. Moderate to severe mitral regurgitation     PLAN:      MVP with mod- severe MR.    -   His valve sounds about the same.  Will repeat his echocardiogram.  Will need to keep a close eye on his valve to monitor his mitral regurgitation as well as his left ventricular function and size..  Fortunately, he is not having any symptoms of shortness of breath.  2.  Hyperlipidemia:  labs managed by Dr. Waynard Edwards   3.  Moderate obesity:  encouraged weight loss         Medication Adjustments/Labs and Tests Ordered: Current medicines are reviewed at length with the patient today.  Concerns regarding medicines are outlined above.  Orders Placed This Encounter  Procedures   Diet NPO time specified    Informed Consent Details: Physician/Practitioner Attestation; Transcribe to consent form and obtain patient signature   Cardiac monitoring   Vital signs   Measure blood pressure   Verify informed consent   Confirm CBC and BMP (or CMP) results within 7 days for inpatient and 30 days for outpatient:   Remove and safely store all jewelry.  Tape rings in place that cannot be removed.   Patient to wear a single hospital gown - Ask patient to remove dentures, if any   Positioning instruction   Echo machine on patient's right. Verify adequate EKG signal.   When patient fully awake discontinue Oxygen, change IV to saline lock and activity Ad Lib - Notify physician when completed   Notify physician (specify)   Pre-admission testing diagnosis   Pulse oximetry, continuous   Oxygen therapy Mode or (Route): Nasal cannula; Liters Per Minute: 2   EP STUDY   ECHO TEE   Insert peripheral IV   Meds ordered this encounter  Medications   0.9 %  sodium chloride infusion    There are no outpatient Patient Instructions on file for this admission.   Addendum 10/31/22  Echo reveals severe MR with flail leaflet. Dr. Jacques Navy read the echo and recommends a TEE   We have scheduled Michael [name] for a TEE . We have discussed risks, benefits, He understands and agrees to proceed

## 2022-11-14 NOTE — Anesthesia Preprocedure Evaluation (Addendum)
Anesthesia Evaluation  Patient identified by MRN, date of birth, ID band Patient awake    Reviewed: Allergy & Precautions, NPO status , Patient's Chart, lab work & pertinent test results  History of Anesthesia Complications Negative for: history of anesthetic complications  Airway Mallampati: I  TM Distance: >3 FB Neck ROM: Full    Dental  (+) Missing, Chipped, Dental Advisory Given   Pulmonary sleep apnea and Continuous Positive Airway Pressure Ventilation , Current SmokerPatient did not abstain from smoking.   breath sounds clear to auscultation       Cardiovascular hypertension, Pt. on medications (-) angina + Valvular Problems/Murmurs MR  Rhythm:Regular Rate:Normal + Systolic murmurs 0/98/1191 ECHO:  1. Eccentric, anteriorly directed severe mitral valve regurgitation with splay artifact and systolic flow reversals in the pulmonary veins. Suspect P2 scallop prolapse and flail. Recommend TEE. The mitral valve is abnormal. Severe mitral valve  regurgitation. No evidence of mitral stenosis.   2. LV EF 55 to 60%. The LV has normal function, no regional wall motion abnormalities. There is mild left ventricular hypertrophy. Left ventricular diastolic parameters are indeterminate.   3. RVF is normal. The right ventricular size is normal. There is normal pulmonary artery systolic pressure. The right ventricular systolic pressure is 18.1 mmHg.   4. Left atrial size was severely dilated.   5. The aortic valve is tricuspid. There is mild calcification of the aortic valve. Aortic valve regurgitation is not visualized. No aortic stenosis is present.       Neuro/Psych   Anxiety     negative neurological ROS     GI/Hepatic Neg liver ROS,GERD  Medicated and Controlled,,  Endo/Other  BMI 32  Renal/GU Renal InsufficiencyRenal disease     Musculoskeletal   Abdominal   Peds  Hematology negative hematology ROS (+)   Anesthesia Other  Findings   Reproductive/Obstetrics                             Anesthesia Physical Anesthesia Plan  ASA: 3  Anesthesia Plan: MAC   Post-op Pain Management: Minimal or no pain anticipated   Induction:   PONV Risk Score and Plan: 0  Airway Management Planned: Natural Airway and Nasal Cannula  Additional Equipment: None  Intra-op Plan:   Post-operative Plan:   Informed Consent: I have reviewed the patients History and Physical, chart, labs and discussed the procedure including the risks, benefits and alternatives for the proposed anesthesia with the patient or authorized representative who has indicated his/her understanding and acceptance.     Dental advisory given  Plan Discussed with: CRNA and Surgeon  Anesthesia Plan Comments:         Anesthesia Quick Evaluation

## 2022-11-15 ENCOUNTER — Encounter (HOSPITAL_COMMUNITY): Payer: Self-pay | Admitting: Internal Medicine

## 2022-11-15 NOTE — Progress Notes (Signed)
PATIENT: Michael [name] DOB: 1950/04/06  REASON FOR VISIT: follow up HISTORY FROM: patient  Chief Complaint  Patient presents with   Follow-up    Pt in room 1. Here for cpap follow up. Pt reports doing well on cpap. No concerns.     HISTORY OF PRESENT ILLNESS:  11/20/22 ALL:  Michael [name] returns for follow up for OSA on CPAP. He continues to do well. He is using therapy nightly for about 7-8 hours, on average. He uses is old machine when traveling. He [facility] and is spending more time there. He denies concerns with machine or supplies. He is followed closely by Dr Waynard Edwards and Dr Melburn Popper. Recent TEE shows mitral valve insufficiency. He has follow up soon to discuss treatment options.     11/15/2021 ALL: Michael [name] is a 72 y.o. male here today for follow up for OSA on CPAP.  He continues to do well. He is using CPAP nightly for about 10 hours. He uses his old machine when out of town. He does not sleep without CPAP.     HISTORY: (copied from Dr Dohmeier's previous note)  HPI:  Michael [name] is a 72 y.o. male  , who is seen here as a referral from Dr. Waynard Edwards for a sleep evaluation. 11-14-2020.   On Michael [name] underwent a repeat home sleep test in order to get a new machine and had been seen in his last visit by Everlene Other who ordered the home sleep test.  His home sleep test was positive for sleep apnea he was able to sleep 8 hours 57 minutes 9% REM time, calculated AHI was 15.2 and no significant oxygen desaturation was noted he was bradycardic test that was notable.  He endorsed today the Epworth sleepiness score at 2 points, the geriatric depression score is 0 out of 15 and he is using a 5 through 12 cm water pressure auto titration device by [employer] this to centimeter EPR.  His old machine started at 7 cm and he asked me today to start this machine also at 7 cm water.  His residual AHI was 0.3 which is excellent he does have some air leakage but it  does not wake him up and his 95th percentile pressure is 11 cmH2O.  His BMI has not significantly changed.  He has quit drinking soft drinks.  I hope that will help him in terms of his medication list I reviewed that and see nothing that would contribute or worsen sleepiness.   REVIEW OF SYSTEMS: Out of a complete 14 system review of symptoms, the patient complains only of the following symptoms, none and all other reviewed systems are negative.  ESS: 3/24  ALLERGIES: No Known Allergies  HOME MEDICATIONS: Outpatient Medications Prior to Visit  Medication Sig Dispense Refill   aspirin 81 MG tablet Take 81 mg by mouth daily.     B Complex CAPS Take 1 capsule by mouth daily.     cholecalciferol (VITAMIN D) 1000 UNITS tablet Take 1,000 Units by mouth daily.     FIBER COMPLETE PO Take 1 tablet by mouth daily.      fluticasone (FLONASE) 50 MCG/ACT nasal spray Place 2 sprays into both nostrils daily as needed for allergies.     loratadine (CLARITIN) 10 MG tablet Take 10 mg by mouth daily as needed for allergies.     losartan (COZAAR) 25 MG tablet Take 50 mg by mouth at bedtime.  pantoprazole (PROTONIX) 40 MG tablet Take 40 mg by mouth daily.     sertraline (ZOLOFT) 50 MG tablet Take 50 mg by mouth daily.     simvastatin (ZOCOR) 20 MG tablet Take 20 mg by mouth at bedtime.     tamsulosin (FLOMAX) 0.4 MG CAPS capsule Take 0.4 mg by mouth daily.     No facility-administered medications prior to visit.    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 and moderate to severe mitral regurgitation   MVP (mitral valve prolapse)    with moderate MR per echo in 2009   Normal cardiac stress test January 2014   OSA on CPAP 11/11/2013   Sleep apnea    wears CPAP   Tinnitus    Tobacco abuse    Tobacco use disorder 03/31/2014   White coat hypertension     PAST SURGICAL HISTORY: Past Surgical History:  Procedure Laterality Date   APPENDECTOMY  2006   HERNIA REPAIR     ROTATOR CUFF REPAIR Left 2007   TEE WITHOUT CARDIOVERSION N/A 11/14/2022   Procedure: TRANSESOPHAGEAL ECHOCARDIOGRAM;  Surgeon: Parke Poisson, MD;  Location: [facility] INVASIVE [facility];  Service: Cardiovascular;  Laterality: N/A;    FAMILY HISTORY: Family History  Problem Relation Age of Onset   Cancer Mother    Cancer Other 7       PANCREATIC CANCER   Hypertension Other    Sleep apnea Brother        CPAP    SOCIAL HISTORY: Social History   Socioeconomic History   Marital status: Married    Spouse name: Not on file   Number of children: 0   Years of education: HS   Highest education level: Not on file  Occupational History   Not on file  Tobacco Use   Smoking status: Heavy Smoker    Current packs/day: 0.00    Average packs/day: 0.5 packs/day for 44.9 years (22.5 ttl pk-yrs)    Types: Cigarettes    Start date: 04/12/1967    Last attempt to quit: 03/06/2012    Years since quitting: 10.7   Smokeless tobacco: Never  Vaping Use   Vaping status: Never Used  Substance and Sexual Activity   Alcohol use: No    Alcohol/week: 0.0 standard drinks of alcohol   Drug use: No   Sexual activity: Yes  Other Topics Concern   Not on file  Social History Narrative   Patient is married, does not have children.   Patient is right handed.   Patient has high school education.   Patient does not drink caffeine.   Social Determinants of Health   Financial Resource Strain: Not on file   Food Insecurity: Not on file  Transportation Needs: Not on file  Physical Activity: Not on file  Stress: Not on file  Social Connections: Not on file  Intimate Partner Violence: Not  on file     PHYSICAL EXAM  Vitals:   11/20/22 1007  BP: (!) 138/56  Pulse: (!) 49  Weight: 226 lb 12.8 oz (102.9 kg)  Height: 5\' 11"  (1.803 m)    Body mass index is 31.63 kg/m.  Generalized: Well developed, in no acute distress  Cardiology: normal rate and rhythm, no murmur noted Respiratory: clear to auscultation bilaterally  Neurological examination  Mentation: Alert oriented to time, place, history taking. Follows all commands speech and language fluent Cranial nerve II-XII: Pupils were equal round reactive to light. Extraocular movements were full, visual field were full  Motor: The motor testing reveals 5 over 5 strength of all 4 extremities. Good symmetric motor tone is noted throughout.  Gait and station: Gait is normal.    DIAGNOSTIC DATA (LABS, IMAGING, TESTING) - I reviewed patient records, labs, notes, testing and imaging myself where available.      No data to display           Lab Results  Component Value Date   WBC 9.7 11/02/2022   HGB 15.6 11/02/2022   HCT 47.5 11/02/2022   MCV 96 11/02/2022   PLT 235 11/02/2022      Component Value Date/Time   NA 143 11/02/2022 1438   K 4.3 11/02/2022 1438   CL 106 11/02/2022 1438   CO2 24 11/02/2022 1438   GLUCOSE 83 11/02/2022 1438   GLUCOSE 102 (H) 10/01/2018 1624   BUN 14 11/02/2022 1438   CREATININE 1.41 (H) 11/02/2022 1438   CALCIUM 9.5 11/02/2022 1438   GFRNONAA 48 (L) 10/01/2018 1624   GFRAA 56 (L) 10/01/2018 1624   No results found for: "CHOL", "HDL", "LDLCALC", "LDLDIRECT", "TRIG", "CHOLHDL" No results found for: "HGBA1C" No results found for: "VITAMINB12" No results found for: "TSH"   ASSESSMENT AND PLAN 72 y.o. year old male  has a past medical history of Anal fissure, Anxiety, Aortic atherosclerosis  (HCC) (04/03/2021), Convulsions (HCC), Coronary artery calcification seen on CT scan (04/03/2021), Diverticulitis, Heart murmur, Hiatal hernia with gastroesophageal reflux, HLD (hyperlipidemia), Moderate to severe mitral regurgitation (10/06/2020), MVP (mitral valve prolapse), Normal cardiac stress test (January 2014), OSA on CPAP (11/11/2013), Sleep apnea, Tinnitus, Tobacco abuse, Tobacco use disorder (03/31/2014), and White coat hypertension. here with     ICD-10-CM   1. OSA on CPAP  G47.33 For home use only DME continuous positive airway pressure (CPAP)        Michael [name] is doing well on CPAP therapy. Compliance report reveals excellent compliance. He was encouraged to continue using CPAP nightly and for greater than 4 hours each night. Leak has improved. We will update supply orders as indicated. Risks of untreated sleep apnea review and education materials provided. Healthy lifestyle habits encouraged. He will follow up in 1 year, sooner if needed. He verbalizes understanding and agreement with this plan.    Orders Placed This Encounter  Procedures   For home use only DME continuous positive airway pressure (CPAP)    Supplies    Order Specific Question:   Length of Need    Answer:   [employer]    Order Specific Question:   Patient has OSA or probable OSA    Answer:   Yes    Order Specific Question:   Is the patient currently using CPAP in the home    Answer:   Yes    Order Specific Question:   Settings    Answer:   Other see comments    Order  Specific Question:   CPAP supplies needed    Answer:   Mask, headgear, cushions, filters, heated tubing and water chamber     No orders of the defined types were placed in this encounter.     Shawnie Dapper, FNP-C 11/20/2022, 10:46 AM [facility] [address], [address] [place], [place] [address] [phone]

## 2022-11-15 NOTE — Patient Instructions (Signed)

## 2022-11-20 ENCOUNTER — Encounter: Payer: Self-pay | Admitting: Family Medicine

## 2022-11-20 ENCOUNTER — Ambulatory Visit: Payer: Medicare Other | Admitting: Family Medicine

## 2022-11-20 VITALS — BP 138/56 | HR 49 | Ht 71.0 in | Wt 226.8 lb

## 2022-11-20 DIAGNOSIS — G4733 Obstructive sleep apnea (adult) (pediatric): Secondary | ICD-10-CM

## 2022-12-03 ENCOUNTER — Encounter: Payer: Self-pay | Admitting: Cardiovascular Disease

## 2022-12-03 NOTE — Progress Notes (Unsigned)
 Cardiology Office Note:    Date:  12/04/2022   ID:  Michael [name], DOB 10-22-1950, MRN [id number]  PCP:  Shayne Anes, MD   [facility] Providers Cardiologist:   Athenia Rys   Electrophysiologist:  OLE ONEIDA HOLTS, MD     Referring MD: Shayne Anes, MD   Chief Complaint  Patient presents with   Mitral Regurgitation     Aug. 25, 2022    Michael [name] is a 72 y.o. male with a hx of MVP , OSA, and bradycardia. He has seen Dr. HOLTS for follow up of his bradycardia. Is active,  Exercises regularly Retired from [employer]   Has MVP wit mod - severe MR now  Was mild MR on his last echo in 2014 Has mild LAE  Encourage weight loss , BP control  Was on bystolic  but this caused bradycardia  Dr. Shayne tried Zetia - did not tolerate it  Is on simva I think he may do better on rosuvastatin  since Simvastatin  has so many drug interactions .   Will defer to Dr. Shayne on this issue for now .  No worsening dyspnea Drinks lots of regular soda .  We discussed weight loss  September 26, 2021:  There is seen today for follow-up of his moderate to severe mitral regurgitation.   Hx of MVP   He has a history of hyperlipidemia and has been on simvastatin .  He has tried Zetia but did not tolerate it.  No dysnea Has stopped smoking 3 months ago   Lipid levels from his primary medical doctor in April, 2023 reveals a total cholesterol of 122 HDL is 35 LDL is 66 Triglyceride level is 104 Hemoglobin A1c is 5.5 Hemoglobin is 17.6.    Aug. 12, 2024 Michael [name] is seen for follow up of his moderate - severe MR , MVP  Hx of endocarditis in the past  Last echo was 1 year Walks regularly ,  no additional dyspnea    Oct. 22, 2024 Michael [name] is seen for folllow up of his severe MR  TEE reveals severe MR Needs R and L heart cath   Has DOE , Is active , works on his on [employer]       Past Medical History:  Diagnosis Date   Anal fissure    Anxiety    Aortic atherosclerosis  (HCC) 04/03/2021   CT in 6/21   Convulsions (HCC)    AS A CHILD   Coronary artery calcification seen on CT scan 04/03/2021   Chest CT in 6/21   Diverticulitis    Heart murmur    Hiatal hernia with gastroesophageal reflux    HLD (hyperlipidemia)    Moderate to severe mitral regurgitation 10/06/2020   Echo 09/2021: EF 60-65, no RWMA, mild LVH, GRII DD, elevated LVEDP, normal RVSF, normal PASP, severe LAE, 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 and moderate to severe mitral regurgitation   MVP (mitral valve prolapse)    with moderate MR per echo in 2009   Normal cardiac stress test January 2014   OSA on CPAP 11/11/2013   Sleep apnea    wears CPAP   Tinnitus    Tobacco abuse    Tobacco use disorder 03/31/2014   White coat hypertension     Past Surgical History:  Procedure Laterality Date   APPENDECTOMY  2006   HERNIA REPAIR     ROTATOR CUFF REPAIR Left 2007   TEE WITHOUT CARDIOVERSION N/A 11/14/2022   Procedure: TRANSESOPHAGEAL ECHOCARDIOGRAM;  Surgeon: [name], MD;  Location: [facility] INVASIVE CV LAB;  Service: Cardiovascular;  Laterality: N/A;    Current Medications: Current Meds  Medication Sig   aspirin  81 MG tablet Take 81 mg by mouth daily.   B Complex CAPS Take 1 capsule by mouth daily.   cholecalciferol (VITAMIN D) 1000 UNITS tablet Take 1,000 Units by mouth daily.   FIBER COMPLETE PO Take 2 tablets by mouth daily.   fluticasone (FLONASE) 50 MCG/ACT nasal spray Place 2 sprays into both nostrils daily as needed for allergies.   loratadine  (CLARITIN ) 10 MG tablet Take 10 mg by mouth daily as needed for allergies.   losartan  (COZAAR ) 25 MG tablet Take 50 mg by mouth at bedtime.    pantoprazole  (PROTONIX ) 40 MG tablet Take 40 mg by mouth daily.   sertraline  (ZOLOFT ) 50 MG tablet Take 50 mg by mouth daily.   simvastatin  (ZOCOR ) 20 MG tablet Take 20 mg by mouth at bedtime.   tamsulosin  (FLOMAX ) 0.4 MG CAPS capsule Take 0.4 mg by mouth every evening.      Allergies:   Patient has no known allergies.   Social History   Socioeconomic History   Marital status: Married    Spouse name: Not on file   Number of children: 0   Years of education: HS   Highest education level: Not on file  Occupational History   Not on file  Tobacco Use   Smoking status: Heavy Smoker    Current packs/day: 0.00    Average packs/day: 0.5 packs/day for 44.9 years (22.5 ttl pk-yrs)    Types: Cigarettes    Start date: 04/12/1967    Last attempt to quit: 03/06/2012    Years since quitting: 10.7   Smokeless tobacco: Never  Vaping Use   Vaping status: Never Used  Substance and Sexual Activity   Alcohol use: No    Alcohol/week: 0.0 standard drinks of alcohol   Drug use: No   Sexual activity: Yes  Other Topics Concern   Not on file  Social History Narrative   Patient is married, does not have children.   Patient is right handed.   Patient has high school education.   Patient does not drink caffeine.   Social Determinants of Health   Financial Resource Strain: Not on file  Food Insecurity: Not on file  Transportation Needs: Not on file  Physical Activity: Not on file  Stress: Not on file  Social Connections: Not on file     Family History: The patient's family history includes Cancer in his mother; Cancer (age of onset: 61) in an other family member; Hypertension in an other family member; Sleep apnea in his brother.  ROS:   Please see the history of present illness.     All other systems reviewed and are negative.  EKGs/Labs/Other Studies Reviewed:    The following studies were reviewed today:     Recent Labs: 11/02/2022: BUN 14; Creatinine, Ser 1.41; Hemoglobin 15.6; Platelets 235; Potassium 4.3; Sodium 143  Recent Lipid Panel No results found for: "CHOL", "TRIG", "HDL", "CHOLHDL", "VLDL", "LDLCALC", "LDLDIRECT"   Risk Assessment/Calculations:           Physical Exam:     Physical Exam: Blood pressure 138/60, pulse (!) 50,  height 5\' 11"  (1.803 m), weight 227 lb (103 kg), SpO2 96%.       GEN:  Well nourished, well developed in no acute distress HEENT: Normal NECK: No JVD; No carotid bruits LYMPHATICS: No lymphadenopathy CARDIAC:  RRR , 2/6 systolic murmur at left ax line  RESPIRATORY:  Clear to auscultation without rales, wheezing or rhonchi  ABDOMEN: Soft, non-tender, non-distended MUSCULOSKELETAL:  No edema; No deformity  SKIN: Warm and dry NEUROLOGIC:  Alert and oriented x 3   EKG:  EKG Interpretation Date/Time:  Tuesday December 04 2022 12:12:22 EDT Ventricular Rate:  43 PR Interval:  180 QRS Duration:  116 QT Interval:  490 QTC Calculation: 414 R Axis:   -56  Text Interpretation: Marked sinus bradycardia Left anterior fascicular block When compared with ECG of 02-Nov-2022 14:14, No significant change was found Confirmed by Alveta Mungo ([id number]) on 12/04/2022 5:07:05 PM     ASSESSMENT:    1. Mitral valve prolapse   2. Moderate to severe mitral regurgitation   3. Pre-procedure lab exam      PLAN:      MVP with mod- severe MR.    He has severe mitral regurgitation.  He has episodes of shortness of breath especially with exertion.  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  We discussed right and left heart catheterization.  He understands the risk, benefits, options of heart catheterization.  He agrees to proceed with the procedure.  Will anticipate referring him to the surgical team following the heart catheterization.    2.  Hyperlipidemia:    3.  Moderate obesity:          Medication Adjustments/Labs and Tests Ordered: Current medicines are reviewed at length with the patient today.  Concerns regarding medicines are outlined above.  Orders Placed This Encounter  Procedures   CBC   Basic metabolic panel   EKG 12-Lead   No orders of the defined types were placed in this encounter.   Patient Instructions  Lab Work: CBC, BMET  today If you have labs (blood work) drawn today and your tests are completely normal, you will receive your results only by: MyChart Message (if you have MyChart) OR A paper copy in the mail If you have any lab test that is abnormal or we need to change your treatment, we will call you to review the results.  Testing/Procedures: R & L Heart Catheterization Your physician has requested that you have a cardiac catheterization. Cardiac catheterization is used to diagnose and/or treat various heart conditions. Doctors may recommend this procedure for a number of different reasons. The most common reason is to evaluate chest pain. Chest pain can be a symptom of coronary artery disease (CAD), and cardiac catheterization can show whether plaque is narrowing or blocking your heart's arteries. This procedure is also used to evaluate the valves, as well as measure the blood flow and oxygen levels in different parts of your heart. For further information please visit [email or web address] Please follow instruction sheet, as given.  Follow-Up: At [facility], you and your health needs are our priority.  As part of our continuing mission to provide you with exceptional heart care, we have created designated Provider Care Teams.  These Care Teams include your primary Cardiologist (physician) and Advanced Practice Providers (APPs -  Physician Assistants and Nurse Practitioners) who all work together to provide you with the care you need, when you need it.  Your next appointment:   3 month(s)  Provider:   Mungo Alveta, MD       Other Instructions      Cardiac/Peripheral Catheterization   You are scheduled for a Cardiac Catheterization on Monday, October 28 with Dr.  Arun Thukkani.  1. Please arrive at the [facility] (Main Entrance A) at [facility]: [address] [place], [place] [address] at 7:00 AM (This time is two hour(s) before your procedure to ensure your preparation). Free  valet parking service is available. You will check in at ADMITTING. The support person will be asked to wait in the waiting room.  It is OK to have someone drop you off and come back when you are ready to be discharged.        Special note: Every effort is made to have your procedure done on time. Please understand that emergencies sometimes delay scheduled procedures.  2. Diet: Do not eat solid foods after midnight.  You may have clear liquids until 5 AM the day of the procedure.  3. Labs: today  4. Medication instructions in preparation for your procedure:   Contrast Allergy: No   On the morning of your procedure, take Aspirin  81 mg and any morning medicines NOT listed above.  You may use sips of water.  5. Plan to go home the same day, you will only stay overnight if medically necessary. 6. You MUST have a responsible adult to drive you home. 7. An adult MUST be with you the first 24 hours after you arrive home. 8. Bring a current list of your medications, and the last time and date medication taken. 9. Bring ID and current insurance cards. 10.Please wear clothes that are easy to get on and off and wear slip-on shoes.  Thank you for allowing us  to care for you!   --  Invasive Cardiovascular services

## 2022-12-03 NOTE — H&P (View-Only) (Signed)
Cardiology Office Note:    Date:  12/04/2022   ID:  Michael [name], DOB [birth year], MRN [id number]  PCP:  Michael Ran, MD   [facility] Providers Cardiologist:   Michael [name]   Electrophysiologist:  Michael Prude, MD     Referring MD: Michael Ran, MD   Chief Complaint  Patient presents with   Mitral Regurgitation     Aug. 25, 2022    Michael [name] is a 72 y.o. male with a hx of MVP , OSA, and bradycardia. He has seen Dr. Lalla [name] for follow up of his bradycardia. Is active,  Exercises regularly Retired from [employer]   Has MVP wit mod - severe MR now  Was mild MR on his last echo in 2014 Has mild LAE  Encourage weight loss , BP control  Was on bystolic but this caused bradycardia  Michael [name] tried Zetia - did not tolerate it  Is on simva I think he may do better on rosuvastatin  since Simvastatin has so many drug interactions .   Will defer to Michael [name] on this issue for now .  No worsening dyspnea Drinks lots of regular soda .  We discussed weight loss  September 26, 2021:  There is seen today for follow-up of his moderate to severe mitral regurgitation.   Hx of MVP   He has a history of hyperlipidemia and has been on simvastatin.  He has tried Zetia but did not tolerate it.  No dysnea Has stopped smoking 3 months ago   Lipid levels from his primary medical doctor in April, 2023 reveals a total cholesterol of 122 HDL is 35 LDL is 66 Triglyceride level is 104 Hemoglobin A1c is 5.5 Hemoglobin is 17.6.    Aug. 12, 2024 Michael [name] is seen for follow up of his moderate - severe MR , MVP  Hx of endocarditis in the past  Last echo was 1 year Walks regularly ,  no additional dyspnea    Oct. 22, 2024 Michael [name] is seen for folllow up of his severe MR  TEE reveals severe MR Needs R and L heart cath   Has DOE , Is active , works on his on [place]       Past Medical History:  Diagnosis Date   Anal fissure    Anxiety    Aortic atherosclerosis  (HCC) 04/03/2021   CT in 6/21   Convulsions (HCC)    AS A CHILD   Coronary artery calcification seen on CT scan 04/03/2021   Chest CT in 6/21   Diverticulitis    Heart murmur    Hiatal hernia with gastroesophageal reflux    HLD (hyperlipidemia)    Moderate to severe mitral regurgitation 10/06/2020   Echo 09/2021: EF 60-65, no RWMA, mild LVH, GRII DD, elevated LVEDP, normal RVSF, normal PASP, severe LAE, 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 and moderate to severe mitral regurgitation   MVP (mitral valve prolapse)    with moderate MR per echo in 2009   Normal cardiac stress test January 2014   OSA on CPAP 11/11/2013   Sleep apnea    wears CPAP   Tinnitus    Tobacco abuse    Tobacco use disorder 03/31/2014   White coat hypertension     Past Surgical History:  Procedure Laterality Date   APPENDECTOMY  2006   HERNIA REPAIR     ROTATOR CUFF REPAIR Left 2007   TEE WITHOUT CARDIOVERSION N/A 11/14/2022   Procedure: TRANSESOPHAGEAL ECHOCARDIOGRAM;  Surgeon: Michael Poisson, MD;  [facility]: [facility] INVASIVE CV LAB;  Service: Cardiovascular;  Laterality: N/A;    Current Medications: Current Meds  Medication Sig   aspirin 81 MG tablet Take 81 mg by mouth daily.   B Complex CAPS Take 1 capsule by mouth daily.   cholecalciferol (VITAMIN D) 1000 UNITS tablet Take 1,000 Units by mouth daily.   FIBER COMPLETE PO Take 2 tablets by mouth daily.   fluticasone (FLONASE) 50 MCG/ACT nasal spray Place 2 sprays into both nostrils daily as needed for allergies.   loratadine (CLARITIN) 10 MG tablet Take 10 mg by mouth daily as needed for allergies.   losartan (COZAAR) 25 MG tablet Take 50 mg by mouth at bedtime.    pantoprazole (PROTONIX) 40 MG tablet Take 40 mg by mouth daily.   sertraline (ZOLOFT) 50 MG tablet Take 50 mg by mouth daily.   simvastatin (ZOCOR) 20 MG tablet Take 20 mg by mouth at bedtime.   tamsulosin (FLOMAX) 0.4 MG CAPS capsule Take 0.4 mg by mouth every evening.      Allergies:   Patient has no known allergies.   Social History   Socioeconomic History   Marital status: Married    Spouse name: Not on file   Number of children: 0   Years of education: HS   Highest education level: Not on file  Occupational History   Not on file  Tobacco Use   Smoking status: Heavy Smoker    Current packs/day: 0.00    Average packs/day: 0.5 packs/day for 44.9 years (22.5 ttl pk-yrs)    Types: Cigarettes    Start date: 04/12/1967    Last attempt to quit: 03/06/2012    Years since quitting: 10.7   Smokeless tobacco: Never  Vaping Use   Vaping status: Never Used  Substance and Sexual Activity   Alcohol use: No    Alcohol/week: 0.0 standard drinks of alcohol   Drug use: No   Sexual activity: Yes  Other Topics Concern   Not on file  Social History Narrative   Patient is married, does not have children.   Patient is right handed.   Patient has high school education.   Patient does not drink caffeine.   Social Determinants of Health   Financial Resource Strain: Not on file  Food Insecurity: Not on file  Transportation Needs: Not on file  Physical Activity: Not on file  Stress: Not on file  Social Connections: Not on file     Family History: The patient's family history includes Cancer in his mother; Cancer (age of onset: 55) in an other family member; Hypertension in an other family member; Sleep apnea in his brother.  ROS:   Please see the history of present illness.     All other systems reviewed and are negative.  EKGs/Labs/Other Studies Reviewed:    The following studies were reviewed today:     Recent Labs: 11/02/2022: BUN 14; Creatinine, Ser 1.41; Hemoglobin 15.6; Platelets 235; Potassium 4.3; Sodium 143  Recent Lipid Panel No results found for: "CHOL", "TRIG", "HDL", "CHOLHDL", "VLDL", "LDLCALC", "LDLDIRECT"   Risk Assessment/Calculations:           Physical Exam:     Physical Exam: Blood pressure 138/60, pulse (!) 50,  height 5\' 11"  (1.803 m), weight 227 lb (103 kg), SpO2 96%.       GEN:  Well nourished, well developed in no acute distress HEENT: Normal NECK: No JVD; No carotid bruits LYMPHATICS: No lymphadenopathy CARDIAC:  RRR , 2/6 systolic murmur at left ax line  RESPIRATORY:  Clear to auscultation without rales, wheezing or rhonchi  ABDOMEN: Soft, non-tender, non-distended MUSCULOSKELETAL:  No edema; No deformity  SKIN: Warm and dry NEUROLOGIC:  Alert and oriented x 3   EKG:  EKG Interpretation Date/Time:  Tuesday December 04 2022 12:12:22 EDT Ventricular Rate:  43 PR Interval:  180 QRS Duration:  116 QT Interval:  490 QTC Calculation: 414 R Axis:   -56  Text Interpretation: Marked sinus bradycardia Left anterior fascicular block When compared with ECG of 02-Nov-2022 14:14, No significant change was found Confirmed by Kristeen Miss ([id number]) on 12/04/2022 5:07:05 PM     ASSESSMENT:    1. Mitral valve prolapse   2. Moderate to severe mitral regurgitation   3. Pre-procedure lab exam      PLAN:      MVP with mod- severe MR.    He has severe mitral regurgitation.  He has episodes of shortness of breath especially with exertion.  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  We discussed right and left heart catheterization.  He understands the risk, benefits, options of heart catheterization.  He agrees to proceed with the procedure.  Will anticipate referring him to the surgical team following the heart catheterization.    2.  Hyperlipidemia:    3.  Moderate obesity:          Medication Adjustments/Labs and Tests Ordered: Current medicines are reviewed at length with the patient today.  Concerns regarding medicines are outlined above.  Orders Placed This Encounter  Procedures   CBC   Basic metabolic panel   EKG 12-Lead   No orders of the defined types were placed in this encounter.   Patient Instructions  Lab Work: CBC, BMET  today If you have labs (blood work) drawn today and your tests are completely normal, you will receive your results only by: MyChart Message (if you have MyChart) OR A paper copy in the mail If you have any lab test that is abnormal or we need to change your treatment, we will call you to review the results.  Testing/Procedures: R & L Heart Catheterization Your physician has requested that you have a cardiac catheterization. Cardiac catheterization is used to diagnose and/or treat various heart conditions. Doctors may recommend this procedure for a number of different reasons. The most common reason is to evaluate chest pain. Chest pain can be a symptom of coronary artery disease (CAD), and cardiac catheterization can show whether plaque is narrowing or blocking your heart's arteries. This procedure is also used to evaluate the valves, as well as measure the blood flow and oxygen levels in different parts of your heart. For further information please visit [email or web address] Please follow instruction sheet, as given.  Follow-Up: At [facility], you and your health needs are our priority.  As part of our continuing mission to provide you with exceptional heart care, we have created designated Provider Care Teams.  These Care Teams include your primary Cardiologist (physician) and Advanced Practice Providers (APPs -  Physician Assistants and Nurse Practitioners) who all work together to provide you with the care you need, when you need it.  Your next appointment:   3 month(s)  Provider:   Kristeen Miss, MD       Other Instructions      Cardiac/Peripheral Catheterization   You are scheduled for a Cardiac Catheterization on Monday, October 28 with Dr.  Alverda Skeans.  1. Please arrive at the [facility] (Main Entrance A) at [facility]: [address] [place], [place] [address] at 7:00 AM (This time is two hour(s) before your procedure to ensure your preparation). Free  valet parking service is available. You will check in at ADMITTING. The support person will be asked to wait in the waiting room.  It is OK to have someone drop you off and come back when you are ready to be discharged.        Special note: Every effort is made to have your procedure done on time. Please understand that emergencies sometimes delay scheduled procedures.  2. Diet: Do not eat solid foods after midnight.  You may have clear liquids until 5 AM the day of the procedure.  3. Labs: today  4. Medication instructions in preparation for your procedure:   Contrast Allergy: No   On the morning of your procedure, take Aspirin 81 mg and any morning medicines NOT listed above.  You may use sips of water.  5. Plan to go home the same day, you will only stay overnight if medically necessary. 6. You MUST have a responsible adult to drive you home. 7. An adult MUST be with you the first 24 hours after you arrive home. 8. Bring a current list of your medications, and the last time and date medication taken. 9. Bring ID and current insurance cards. 10.Please wear clothes that are easy to get on and off and wear slip-on shoes.  Thank you for allowing [place] to care for you!   -- [place] Invasive Cardiovascular services

## 2022-12-04 ENCOUNTER — Encounter: Payer: Self-pay | Admitting: Cardiovascular Disease

## 2022-12-04 ENCOUNTER — Ambulatory Visit: Payer: Medicare Other | Attending: Cardiovascular Disease | Admitting: Cardiovascular Disease

## 2022-12-04 VITALS — BP 138/60 | HR 50 | Ht 71.0 in | Wt 227.0 lb

## 2022-12-04 DIAGNOSIS — Z01812 Encounter for preprocedural laboratory examination: Secondary | ICD-10-CM | POA: Diagnosis not present

## 2022-12-04 DIAGNOSIS — I340 Nonrheumatic mitral (valve) insufficiency: Secondary | ICD-10-CM | POA: Diagnosis not present

## 2022-12-04 DIAGNOSIS — I341 Nonrheumatic mitral (valve) prolapse: Secondary | ICD-10-CM | POA: Diagnosis not present

## 2022-12-04 NOTE — Patient Instructions (Signed)
Lab Work: CBC, BMET today If you have labs (blood work) drawn today and your tests are completely normal, you will receive your results only by: MyChart Message (if you have MyChart) OR A paper copy in the mail If you have any lab test that is abnormal or we need to change your treatment, we will call you to review the results.  Testing/Procedures: R & L Heart Catheterization Your physician has requested that you have a cardiac catheterization. Cardiac catheterization is used to diagnose and/or treat various heart conditions. Doctors may recommend this procedure for a number of different reasons. The most common reason is to evaluate chest pain. Chest pain can be a symptom of coronary artery disease (CAD), and cardiac catheterization can show whether plaque is narrowing or blocking your heart's arteries. This procedure is also used to evaluate the valves, as well as measure the blood flow and oxygen levels in different parts of your heart. For further information please visit [email or web address] Please follow instruction sheet, as given.  Follow-Up: At [facility], you and your health needs are our priority.  As part of our continuing mission to provide you with exceptional heart care, we have created designated Provider Care Teams.  These Care Teams include your primary Cardiologist (physician) and Advanced Practice Providers (APPs -  Physician Assistants and Nurse Practitioners) who all work together to provide you with the care you need, when you need it.  Your next appointment:   3 month(s)  Provider:   Kristeen Miss, MD       Other Instructions      Cardiac/Peripheral Catheterization   You are scheduled for a Cardiac Catheterization on Monday, October 28 with Dr. Alverda Skeans.  1. Please arrive at the [facility] (Main Entrance A) at [facility]: [address] [place], [place] [address] at 7:00 AM (This time is two hour(s) before your procedure to ensure your  preparation). Free valet parking service is available. You will check in at ADMITTING. The support person will be asked to wait in the waiting room.  It is OK to have someone drop you off and come back when you are ready to be discharged.        Special note: Every effort is made to have your procedure done on time. Please understand that emergencies sometimes delay scheduled procedures.  2. Diet: Do not eat solid foods after midnight.  You may have clear liquids until 5 AM the day of the procedure.  3. Labs: today  4. Medication instructions in preparation for your procedure:   Contrast Allergy: No   On the morning of your procedure, take Aspirin 81 mg and any morning medicines NOT listed above.  You may use sips of water.  5. Plan to go home the same day, you will only stay overnight if medically necessary. 6. You MUST have a responsible adult to drive you home. 7. An adult MUST be with you the first 24 hours after you arrive home. 8. Bring a current list of your medications, and the last time and date medication taken. 9. Bring ID and current insurance cards. 10.Please wear clothes that are easy to get on and off and wear slip-on shoes.  Thank you for allowing [place] to care for you!   -- [facility]

## 2022-12-05 LAB — BASIC METABOLIC PANEL
BUN/Creatinine Ratio: 12 (ref 10–24)
BUN: 15 mg/dL (ref 8–27)
CO2: 23 mmol/L (ref 20–29)
Calcium: 9.3 mg/dL (ref 8.6–10.2)
Chloride: 106 mmol/L (ref 96–106)
Creatinine, Ser: 1.28 mg/dL — ABNORMAL HIGH (ref 0.76–1.27)
Glucose: 72 mg/dL (ref 70–99)
Potassium: 4.3 mmol/L (ref 3.5–5.2)
Sodium: 145 mmol/L — ABNORMAL HIGH (ref 134–144)
eGFR: 59 mL/min/{1.73_m2} — ABNORMAL LOW (ref >59)

## 2022-12-05 LAB — CBC
Hematocrit: 49 % (ref 37.5–51.0)
Hemoglobin: 16.1 g/dL (ref 13.0–17.7)
MCH: 32 pg (ref 26.6–33.0)
MCHC: 32.9 g/dL (ref 31.5–35.7)
MCV: 97 fL (ref 79–97)
Platelets: 255 10*3/uL (ref 150–450)
RBC: 5.03 x10E6/uL (ref 4.14–5.80)
RDW: 12.2 % (ref 11.6–15.4)
WBC: 8.8 10*3/uL (ref 3.4–10.8)

## 2022-12-06 ENCOUNTER — Telehealth: Payer: Self-pay | Admitting: *Deleted

## 2022-12-06 NOTE — Telephone Encounter (Signed)
Cardiac Catheterization scheduled at [facility] for: Monday December 10, 2022 9 AM Arrival time [facility] Main Entrance A at: 7 AM  Nothing to eat after midnight prior to procedure, clear liquids until 5 AM day of procedure.  Medication instructions: -Hold:  Losartan-day before and day of procedure -per protocol GFR < 60 (59)-pt takes HS -will hold HS prior to procedure -Usual morning medications can be taken with sips of water including aspirin 81 mg.  Plan to go home the same day, you will only stay overnight if medically necessary.  You must have responsible adult to drive you home.  Someone must be with you the first 24 hours after you arrive home.  Reviewed procedure instructions with patient.

## 2022-12-10 ENCOUNTER — Other Ambulatory Visit: Payer: Self-pay

## 2022-12-10 ENCOUNTER — Ambulatory Visit (HOSPITAL_COMMUNITY)
Admission: RE | Admit: 2022-12-10 | Discharge: 2022-12-10 | Disposition: A | Discharge status: Home / Self Care | Payer: Medicare Other | Attending: Internal Medicine | Admitting: Internal Medicine

## 2022-12-10 ENCOUNTER — Encounter (HOSPITAL_COMMUNITY)
Admission: RE | Disposition: A | Discharge status: Home / Self Care | Payer: Self-pay | Source: Home / Self Care | Attending: Internal Medicine

## 2022-12-10 DIAGNOSIS — R001 Bradycardia, unspecified: Secondary | ICD-10-CM | POA: Diagnosis not present

## 2022-12-10 DIAGNOSIS — I2510 Atherosclerotic heart disease of native coronary artery without angina pectoris: Secondary | ICD-10-CM | POA: Diagnosis not present

## 2022-12-10 DIAGNOSIS — Z87891 Personal history of nicotine dependence: Secondary | ICD-10-CM | POA: Diagnosis not present

## 2022-12-10 DIAGNOSIS — E785 Hyperlipidemia, unspecified: Secondary | ICD-10-CM | POA: Diagnosis not present

## 2022-12-10 DIAGNOSIS — R0609 Other forms of dyspnea: Secondary | ICD-10-CM | POA: Diagnosis not present

## 2022-12-10 DIAGNOSIS — G4733 Obstructive sleep apnea (adult) (pediatric): Secondary | ICD-10-CM | POA: Insufficient documentation

## 2022-12-10 DIAGNOSIS — Z8249 Family history of ischemic heart disease and other diseases of the circulatory system: Secondary | ICD-10-CM | POA: Insufficient documentation

## 2022-12-10 DIAGNOSIS — E669 Obesity, unspecified: Secondary | ICD-10-CM | POA: Insufficient documentation

## 2022-12-10 DIAGNOSIS — I340 Nonrheumatic mitral (valve) insufficiency: Secondary | ICD-10-CM | POA: Diagnosis present

## 2022-12-10 DIAGNOSIS — Z79899 Other long term (current) drug therapy: Secondary | ICD-10-CM | POA: Diagnosis not present

## 2022-12-10 DIAGNOSIS — I10 Essential (primary) hypertension: Secondary | ICD-10-CM | POA: Diagnosis not present

## 2022-12-10 DIAGNOSIS — Z6832 Body mass index (BMI) 32.0-32.9, adult: Secondary | ICD-10-CM | POA: Insufficient documentation

## 2022-12-10 DIAGNOSIS — I341 Nonrheumatic mitral (valve) prolapse: Secondary | ICD-10-CM | POA: Insufficient documentation

## 2022-12-10 HISTORY — PX: RIGHT/LEFT HEART CATH AND CORONARY ANGIOGRAPHY: CATH118266

## 2022-12-10 LAB — POCT I-STAT 7, (LYTES, BLD GAS, ICA,H+H)
Acid-base deficit: 2 mmol/L (ref 0.0–2.0)
Bicarbonate: 22.7 mmol/L (ref 20.0–28.0)
Calcium, Ion: 1.15 mmol/L (ref 1.15–1.40)
HCT: 44 % (ref 39.0–52.0)
Hemoglobin: 15 g/dL (ref 13.0–17.0)
O2 Saturation: 94 %
Potassium: 3.9 mmol/L (ref 3.5–5.1)
Sodium: 142 mmol/L (ref 135–145)
TCO2: 24 mmol/L (ref 22–32)
pCO2 arterial: 39.1 mm[Hg] (ref 32–48)
pH, Arterial: 7.371 (ref 7.35–7.45)
pO2, Arterial: 71 mm[Hg] — ABNORMAL LOW (ref 83–108)

## 2022-12-10 LAB — POCT I-STAT EG7
Acid-Base Excess: 0 mmol/L (ref 0.0–2.0)
Acid-base deficit: 2 mmol/L (ref 0.0–2.0)
Acid-base deficit: 1 mmol/L (ref 0.0–2.0)
Bicarbonate: 23.6 mmol/L (ref 20.0–28.0)
Bicarbonate: 24.7 mmol/L (ref 20.0–28.0)
Bicarbonate: 25.5 mmol/L (ref 20.0–28.0)
Calcium, Ion: 1.1 mmol/L — ABNORMAL LOW (ref 1.15–1.40)
Calcium, Ion: 1.17 mmol/L (ref 1.15–1.40)
Calcium, Ion: 1.22 mmol/L (ref 1.15–1.40)
HCT: 44 % (ref 39.0–52.0)
HCT: 45 % (ref 39.0–52.0)
HCT: 45 % (ref 39.0–52.0)
Hemoglobin: 15 g/dL (ref 13.0–17.0)
Hemoglobin: 15.3 g/dL (ref 13.0–17.0)
Hemoglobin: 15.3 g/dL (ref 13.0–17.0)
O2 Saturation: 70 %
O2 Saturation: 68 %
O2 Saturation: 73 %
Potassium: 3.8 mmol/L (ref 3.5–5.1)
Potassium: 4 mmol/L (ref 3.5–5.1)
Potassium: 4 mmol/L (ref 3.5–5.1)
Sodium: 143 mmol/L (ref 135–145)
Sodium: 141 mmol/L (ref 135–145)
Sodium: 142 mmol/L (ref 135–145)
TCO2: 25 mmol/L (ref 22–32)
TCO2: 26 mmol/L (ref 22–32)
TCO2: 27 mmol/L (ref 22–32)
pCO2, Ven: 42.3 mm[Hg] — ABNORMAL LOW (ref 44–60)
pCO2, Ven: 43.6 mm[Hg] — ABNORMAL LOW (ref 44–60)
pCO2, Ven: 44.4 mm[Hg] (ref 44–60)
pH, Ven: 7.354 (ref 7.25–7.43)
pH, Ven: 7.361 (ref 7.25–7.43)
pH, Ven: 7.368 (ref 7.25–7.43)
pO2, Ven: 38 mm[Hg] (ref 32–45)
pO2, Ven: 36 mm[Hg] (ref 32–45)
pO2, Ven: 40 mm[Hg] (ref 32–45)

## 2022-12-10 SURGERY — RIGHT/LEFT HEART CATH AND CORONARY ANGIOGRAPHY
Anesthesia: LOCAL

## 2022-12-10 MED ORDER — HEPARIN (PORCINE) IN NACL 1000-0.9 UT/500ML-% IV SOLN
INTRAVENOUS | Status: DC | PRN
  Administered 2022-12-10 (×2): 500 mL via INTRA_ARTERIAL

## 2022-12-10 MED ORDER — SODIUM CHLORIDE 0.9% FLUSH: 3.0000 mL | INTRAVENOUS | Status: DC | PRN

## 2022-12-10 MED ORDER — ACETAMINOPHEN 325 MG PO TABS: 650.0000 mg | ORAL_TABLET | ORAL | Status: DC | PRN

## 2022-12-10 MED ORDER — HEPARIN SODIUM (PORCINE) 1000 UNIT/ML IJ SOLN
INTRAMUSCULAR | Status: AC
  Filled 2022-12-10: qty 10

## 2022-12-10 MED ORDER — MIDAZOLAM HCL 2 MG/2ML IJ SOLN
INTRAMUSCULAR | Status: DC | PRN
  Administered 2022-12-10 (×2): 1 mg via INTRAVENOUS

## 2022-12-10 MED ORDER — ONDANSETRON HCL 4 MG/2ML IJ SOLN: 4.0000 mg | Freq: Four times a day (QID) | INTRAMUSCULAR | Status: DC | PRN

## 2022-12-10 MED ORDER — LIDOCAINE HCL (PF) 1 % IJ SOLN
INTRAMUSCULAR | Status: AC
  Filled 2022-12-10: qty 30

## 2022-12-10 MED ORDER — SODIUM CHLORIDE 0.9% FLUSH: 3.0000 mL | Freq: Two times a day (BID) | INTRAVENOUS | Status: DC

## 2022-12-10 MED ORDER — IOHEXOL 350 MG/ML SOLN
INTRAVENOUS | Status: DC | PRN
  Administered 2022-12-10: 74 mL via INTRA_ARTERIAL

## 2022-12-10 MED ORDER — HEPARIN SODIUM (PORCINE) 1000 UNIT/ML IJ SOLN
INTRAMUSCULAR | Status: DC | PRN
  Administered 2022-12-10: 5000 [IU] via INTRAVENOUS

## 2022-12-10 MED ORDER — SODIUM CHLORIDE 0.9 % IV SOLN: 250.0000 mL | INTRAVENOUS | Status: DC | PRN

## 2022-12-10 MED ORDER — HYDRALAZINE HCL 20 MG/ML IJ SOLN: 10.0000 mg | INTRAMUSCULAR | Status: DC | PRN

## 2022-12-10 MED ORDER — ASPIRIN 81 MG PO CHEW: 81.0000 mg | CHEWABLE_TABLET | ORAL | Status: DC

## 2022-12-10 MED ORDER — SODIUM CHLORIDE 0.9 % WEIGHT BASED INFUSION
3.0000 mL/kg/h | INTRAVENOUS | Status: DC
  Administered 2022-12-10: 3 mL/kg/h via INTRAVENOUS

## 2022-12-10 MED ORDER — FENTANYL CITRATE (PF) 100 MCG/2ML IJ SOLN
INTRAMUSCULAR | Status: AC
  Filled 2022-12-10: qty 2

## 2022-12-10 MED ORDER — LABETALOL HCL 5 MG/ML IV SOLN: 10.0000 mg | INTRAVENOUS | Status: DC | PRN

## 2022-12-10 MED ORDER — MIDAZOLAM HCL 2 MG/2ML IJ SOLN
INTRAMUSCULAR | Status: AC
  Filled 2022-12-10: qty 2

## 2022-12-10 MED ORDER — VERAPAMIL HCL 2.5 MG/ML IV SOLN
INTRAVENOUS | Status: DC | PRN
  Administered 2022-12-10: 5 mL via INTRA_ARTERIAL

## 2022-12-10 MED ORDER — SODIUM CHLORIDE 0.9 % WEIGHT BASED INFUSION: 1.0000 mL/kg/h | INTRAVENOUS | Status: DC

## 2022-12-10 MED ORDER — FENTANYL CITRATE (PF) 100 MCG/2ML IJ SOLN
INTRAMUSCULAR | Status: DC | PRN
  Administered 2022-12-10 (×2): 25 ug via INTRAVENOUS

## 2022-12-10 MED ORDER — LIDOCAINE HCL (PF) 1 % IJ SOLN
INTRAMUSCULAR | Status: DC | PRN
  Administered 2022-12-10 (×2): 5 mL

## 2022-12-10 MED ORDER — VERAPAMIL HCL 2.5 MG/ML IV SOLN
INTRAVENOUS | Status: AC
  Filled 2022-12-10: qty 2

## 2022-12-10 SURGICAL SUPPLY — 13 items
CATH BALLN WEDGE 5F 110CM (CATHETERS) IMPLANT
CATH INFINITI 5FR ANG PIGTAIL (CATHETERS) IMPLANT
CATH INFINITI AMBI 6FR TG (CATHETERS) IMPLANT
DEVICE RAD COMP TR BAND LRG (VASCULAR PRODUCTS) IMPLANT
ELECT DEFIB PAD ADLT CADENCE (PAD) IMPLANT
GLIDESHEATH SLEND SS 6F .021 (SHEATH) IMPLANT
GUIDEWIRE .025 260CM (WIRE) IMPLANT
KIT MICROPUNCTURE NIT STIFF (SHEATH) IMPLANT
PACK CARDIAC CATHETERIZATION (CUSTOM PROCEDURE TRAY) ×2 IMPLANT
SET ATX-X65L (MISCELLANEOUS) IMPLANT
SHEATH GLIDE SLENDER 4/5FR (SHEATH) IMPLANT
SHEATH PROBE COVER 6X72 (BAG) IMPLANT
WIRE EMERALD 3MM-J .035X260CM (WIRE) IMPLANT

## 2022-12-10 NOTE — Discharge Instructions (Signed)
Arterial site This sheet gives you information about how to care for yourself after your procedure. Your health care provider may also give you more specific instructions. If you have problems or questions, contact your health care provider. What can I expect after the procedure? After the procedure, it is common to have: Bruising that usually fades within 1-2 weeks. Tenderness at the site. Follow these instructions at home: Wound care May remove bandage AT 12 NOON, 12/11/2022 Do not take baths, swim, or use a hot tub for 5 days. You may shower AFTER REMOVAL OF DRESSING Pat the area dry with a clean towel. Do not rub the site. This may cause bleeding. Do not apply powder or lotion to the site. Keep the site clean and dry. Check your  site every day for signs of infection. Check for: Redness, swelling, or pain. Fluid or blood. Warmth. Pus or a bad smell. Activity For the first 2-3 days after your procedure Avoid USING YOUR RIGHT ARM FOR 24 HOURS Do not lift, push or pull anything that is heavier than 10 lb for 5 days. Rest as directed. Do not drive for 24 hours. General instructions Take over-the-counter and prescription medicines only as told by your health care provider. Keep all follow-up visits as told by your health care provider. This is important. DRINK PLENTY OF FLUIDS FOR THE NEXT 2-3 DAYS. Contact a health care provider if you have: A fever or chills. You have redness, swelling, or pain around your insertion site. Get help right away if: The catheter insertion area swells very fast. You pass out. You suddenly start to sweat or your skin gets clammy. The catheter insertion area is bleeding, and the bleeding does not stop when you hold steady pressure on the area FOR 15 MINUTES The area near or just beyond the catheter insertion site becomes pale, cool, tingly, or numb. These symptoms may represent a serious problem that is an emergency. Do not wait to see if the symptoms  will go away. Get medical help right away. Call your local emergency services (911 in the [place] Do not drive yourself to the hospital. Summary After the procedure, it is common to have bruising that usually fades within 1-2 weeks. Check your site every day for signs of infection. Do not lift, push or pull anything that is heavier than 10 lb for 5 days.  This information is not intended to replace advice given to you by your health care provider. Make sure you discuss any questions you have with your health care provider. Document Revised: 02/11/2017 Document Reviewed: 02/11/2017    Drink plenty of fluids for 48 hours and keep wrist elevated at heart level for 24 hours  Radial Site Care   This sheet gives you information about how to care for yourself after your procedure. Your health care provider may also give you more specific instructions. If you have problems or questions, contact your health care provider. What can I expect after the procedure? After the procedure, it is common to have: Bruising and tenderness at the catheter insertion area. Follow these instructions at home: Medicines Take over-the-counter and prescription medicines only as told by your health care provider. Insertion site care Follow instructions from your health care provider about how to take care of your insertion site. Make sure you: Wash your hands with soap and water before you change your bandage (dressing). If soap and water are not available, use hand sanitizer. Remove your dressing AT 12 NOON, 12/11/2022 Check your  insertion site every day for signs of infection. Check for: Redness, swelling, or pain. Fluid or blood. Pus or a bad smell. Warmth. Do not take baths, swim, or use a hot tub until your health care provider approves. You may shower AFTER REMOVAL OF DRESSING Remove the dressing  Pat the area dry with a clean towel. Do not rub the site. That could cause bleeding. Do not apply powder or  lotion to the site. Activity   For 24 hours after the procedure, or as directed by your health care provider: Do not flex or bend the affected arm. Do not push or pull heavy objects with the affected arm. Do not drive yourself home from the hospital or clinic. You may drive 24 hours after the procedure unless your health care provider tells you not to. Do not operate machinery or power tools. Do not lift anything that is heavier than 10 lb (4.5 kg), or the limit that you are told, until your health care provider says that it is safe.  For 4 days Ask your health care provider when it is okay to: Return to work or school. Resume usual physical activities or sports. Resume sexual activity. General instructions If the catheter site starts to bleed, raise your arm and put firm pressure on the site. If the bleeding does not stop, get help right away. This is a medical emergency. If you went home on the same day as your procedure, a responsible adult should be with you for the first 24 hours after you arrive home. Keep all follow-up visits as told by your health care provider. This is important. Contact a health care provider if: You have a fever. You have redness, swelling, or yellow drainage around your insertion site. Get help right away if: You have unusual pain at the radial site. The catheter insertion area swells very fast. The insertion area is bleeding, and the bleeding does not stop when you hold steady pressure on the area. Your arm or hand becomes pale, cool, tingly, or numb. These symptoms may represent a serious problem that is an emergency. Do not wait to see if the symptoms will go away. Get medical help right away. Call your local emergency services (911 in the [place] Do not drive yourself to the hospital. Summary After the procedure, it is common to have bruising and tenderness at the site. Follow instructions from your health care provider about how to take care of your  radial site wound. Check the wound every day for signs of infection. Do not lift anything that is heavier than 10 lb (4.5 kg), or the limit that you are told, until your health care provider says that it is safe. This information is not intended to replace advice given to you by your health care provider. Make sure you discuss any questions you have with your health care provider. Document Revised: 03/06/2017 Document Reviewed: 03/06/2017 Elsevier Patient Education  2020 [employer].

## 2022-12-10 NOTE — Interval H&P Note (Signed)
History and Physical Interval Note:  12/10/2022 7:00 AM  Michael [name]  has presented today for surgery, with the diagnosis of mr.  The various methods of treatment have been discussed with the patient and family. After consideration of risks, benefits and other options for treatment, the patient has consented to  Procedure(s): RIGHT/LEFT HEART CATH AND CORONARY ANGIOGRAPHY (N/A) as a surgical intervention.  The patient's history has been reviewed, patient examined, no change in status, stable for surgery.  I have reviewed the patient's chart and labs.  Questions were answered to the patient's satisfaction.     Orbie Pyo

## 2022-12-11 ENCOUNTER — Encounter (HOSPITAL_COMMUNITY): Payer: Self-pay | Admitting: Internal Medicine

## 2023-01-15 NOTE — Progress Notes (Unsigned)
[address]       [place] [address]             [phone]           Michael [name] [place] Medical Record #[id number] Date of Birth: 05-12-1950  Michael Ran, MD Michael Ran, MD  Chief Complaint: Mitral regurgitation    History of Present Illness:     Pt is a very pleasant 72 yo male who has had a murmur all his life. He started having echos in his 41s and has been followed closely for MV prolapse. Pt also with bradycardia and is followed by EP and was told at some point may need PPM. Pt recently seen by cardiology and was found on TTE to now have more severe MR. He was relatively asymptomatic but on the last clinic visit started to complain of DOE. He has no lightheadedness nor CP or lower ext edema. Pt was felt to best have better evaluation with TEE and was found to have EF of 55-60% and severe MR from P1 flail. Pt went on to have L and R heart cath with no CAD and minimal Pulm artery pressures. 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 and moderate to severe mitral regurgitation   MVP (mitral valve prolapse)    with moderate MR per echo in 2009   Normal cardiac stress test January 2014   OSA on CPAP 11/11/2013   Sleep apnea    wears CPAP   Tinnitus    Tobacco abuse    Tobacco use disorder 03/31/2014   White coat hypertension     Past Surgical  History:  Procedure Laterality Date   APPENDECTOMY  2006   HERNIA REPAIR     RIGHT/LEFT HEART CATH AND CORONARY ANGIOGRAPHY N/A 12/10/2022   Procedure: RIGHT/LEFT HEART CATH AND CORONARY ANGIOGRAPHY;  Surgeon: Orbie Pyo, MD;  Location: [facility] INVASIVE CV LAB;  Service: Cardiovascular;  Laterality: N/A;   ROTATOR CUFF REPAIR Left 2007   TEE WITHOUT CARDIOVERSION N/A 11/14/2022   Procedure: TRANSESOPHAGEAL ECHOCARDIOGRAM;  Surgeon: Parke Poisson, MD;  Location: [facility] INVASIVE CV LAB;  Service: Cardiovascular;  Laterality: N/A;    Social History   Tobacco Use  Smoking Status Heavy Smoker   Current packs/day: 0.00   Average packs/day: 0.5 packs/day for 44.9 years (22.5 ttl pk-yrs)   Types: Cigarettes   Start date: 04/12/1967   Last attempt to quit: 03/06/2012   Years since quitting: 10.8  Smokeless Tobacco Never    Social History   Substance and Sexual Activity  Alcohol Use No   Alcohol/week: 0.0 standard drinks of alcohol    Social History   Socioeconomic History   Marital status: Married    Spouse name:  Not on file   Number of children: 0   Years of education: HS   Highest education level: Not on file  Occupational History   Not on file  Tobacco Use   Smoking status: Heavy Smoker    Current packs/day: 0.00    Average packs/day: 0.5 packs/day for 44.9 years (22.5 ttl pk-yrs)    Types: Cigarettes    Start date: 04/12/1967    Last attempt to quit: 03/06/2012    Years since quitting: 10.8   Smokeless tobacco: Never  Vaping Use   Vaping status: Never Used  Substance and Sexual Activity   Alcohol use: No    Alcohol/week: 0.0 standard drinks of alcohol   Drug use: No   Sexual activity: Yes  Other Topics Concern   Not on file  Social History Narrative   Patient is married, does not have children.   Patient is right handed.   Patient has high school education.   Patient does not drink caffeine.   Social Determinants of Health   Financial Resource Strain: Not on  file  Food Insecurity: Not on file  Transportation Needs: Not on file  Physical Activity: Not on file  Stress: Not on file  Social Connections: Not on file  Intimate Partner Violence: Not on file    No Known Allergies  Current Outpatient Medications  Medication Sig Dispense Refill   aspirin 81 MG tablet Take 81 mg by mouth daily.     B Complex CAPS Take 1 capsule by mouth daily.     cholecalciferol (VITAMIN D) 1000 UNITS tablet Take 1,000 Units by mouth daily.     FIBER COMPLETE PO Take 2 tablets by mouth daily.     fluticasone (FLONASE) 50 MCG/ACT nasal spray Place 2 sprays into both nostrils daily as needed for allergies.     loratadine (CLARITIN) 10 MG tablet Take 10 mg by mouth daily as needed for allergies.     losartan (COZAAR) 25 MG tablet Take 50 mg by mouth at bedtime.      pantoprazole (PROTONIX) 40 MG tablet Take 40 mg by mouth daily.     sertraline (ZOLOFT) 50 MG tablet Take 50 mg by mouth daily.     simvastatin (ZOCOR) 20 MG tablet Take 20 mg by mouth at bedtime.     tamsulosin (FLOMAX) 0.4 MG CAPS capsule Take 0.4 mg by mouth every evening.     No current facility-administered medications for this visit.     Family History  Problem Relation Age of Onset   Cancer Mother    Cancer Other 61       PANCREATIC CANCER   Hypertension Other    Sleep apnea Brother        CPAP       Physical Exam: Healthy appearing Lungs: clear Teeth in good repair Card: RR with 2/6 sem Ext: no edema Neuro: alert and oriented     Diagnostic Studies & Laboratory data: I have personally reviewed the following studies and agree with the findings   TTE (10/2022) IMPRESSIONS     1. Eccentric, anteriorly directed severe mitral valve regurgitation with  splay artifact and systolic flow reversals in the pulmonary veins. Suspect  P2 scallop prolapse and flail. Recommend TEE. The mitral valve is  abnormal. Severe mitral valve  regurgitation. No evidence of mitral stenosis.   2.  Left ventricular ejection fraction, by estimation, is 55 to 60%. The  left ventricle has normal function. The left ventricle has no regional  wall motion abnormalities. There is mild left ventricular hypertrophy.  Left ventricular diastolic parameters  are indeterminate.   3. Right ventricular systolic function is normal. The right ventricular  size is normal. There is normal pulmonary artery systolic pressure. The  estimated right ventricular systolic pressure is 18.1 mmHg.   4. Left atrial size was severely dilated.   5. The aortic valve is tricuspid. There is mild calcification of the  aortic valve. Aortic valve regurgitation is not visualized. No aortic  stenosis is present.   6. The inferior vena cava is normal in size with greater than 50%  respiratory variability, suggesting right atrial pressure of 3 mmHg.   Comparison(s): No significant change from prior study despite difference  in reporting. Prior images reviewed side by side.   Conclusion(s)/Recommendation(s): Consider TEE to further evalute for  mitral valve posterior leaflet flail.   FINDINGS   Left Ventricle: Left ventricular ejection fraction, by estimation, is 55  to 60%. The left ventricle has normal function. The left ventricle has no  regional wall motion abnormalities. Global longitudinal strain performed  but not reported based on  interpreter judgement due to suboptimal tracking. 3D ejection fraction  reviewed and evaluated as part of the interpretation. Alternate  measurement of EF is felt to be most reflective of LV function. The left  ventricular internal cavity size was normal in   size. There is mild left ventricular hypertrophy. Left ventricular  diastolic function could not be evaluated due to mitral regurgitation  (moderate or greater). Left ventricular diastolic parameters are  indeterminate.   Right Ventricle: The right ventricular size is normal. No increase in  right ventricular wall thickness.  Right ventricular systolic function is  normal. There is normal pulmonary artery systolic pressure. The tricuspid  regurgitant velocity is 1.94 m/s, and   with an assumed right atrial pressure of 3 mmHg, the estimated right  ventricular systolic pressure is 18.1 mmHg.   Left Atrium: Left atrial size was severely dilated.   Right Atrium: Right atrial size was normal in size.   Pericardium: There is no evidence of pericardial effusion. Presence of  epicardial fat layer.   Mitral Valve: Eccentric, anteriorly directed severe mitral valve  regurgitation with splay artifact and systolic flow reversals in the  pulmonary veins. Suspect P2 scallop prolapse and flail. Recommend TEE. The  mitral valve is abnormal. Severe mitral valve   regurgitation. No evidence of mitral valve stenosis. The mean mitral  valve gradient is 2.1 mmHg with average heart rate of 41 bpm.   Tricuspid Valve: The tricuspid valve is normal in structure. Tricuspid  valve regurgitation is not demonstrated. No evidence of tricuspid  stenosis.   Aortic Valve: The aortic valve is tricuspid. There is mild calcification  of the aortic valve. Aortic valve regurgitation is not visualized. No  aortic stenosis is present.   Pulmonic Valve: The pulmonic valve was normal in structure. Pulmonic valve  regurgitation is not visualized. No evidence of pulmonic stenosis.   Aorta: The aortic root is normal in size and structure.   Venous: A pattern of systolic flow reversal, suggestive of severe mitral  regurgitation is recorded from the right lower pulmonary vein. The  inferior vena cava is normal in size with greater than 50% respiratory  variability, suggesting right atrial  pressure of 3 mmHg.   IAS/Shunts: No atrial level shunt detected by color flow Doppler.     LEFT VENTRICLE  PLAX 2D  LVIDd:  5.60 cm   Diastology  LVIDs:         3.00 cm   LV e' medial:    6.96 cm/s  LV PW:         1.20 cm   LV E/e' medial:  18.7   LV IVS:        1.20 cm   LV e' lateral:   10.40 cm/s  LVOT diam:     2.00 cm   LV E/e' lateral: 12.5  LV SV:         84  LV SV Index:   38  LVOT Area:     3.14 cm                             3D Volume EF:                           LV EDV:       244 ml                           LV ESV:       71 ml                           LV SV:        173 ml   RIGHT VENTRICLE  RV Basal diam:  2.70 cm  RV Mid diam:    2.60 cm  RV S prime:     19.10 cm/s  TAPSE (M-mode): 2.3 cm  RVSP:           18.1 mmHg   LEFT ATRIUM              Index        RIGHT ATRIUM           Index  LA diam:        6.00 cm  2.69 cm/m   RA Pressure: 3.00 mmHg  LA Vol (A2C):   122.0 ml 54.73 ml/m  RA Area:     15.00 cm  LA Vol (A4C):   116.0 ml 52.03 ml/m  RA Volume:   34.10 ml  15.30 ml/m  LA Biplane Vol: 120.0 ml 53.83 ml/m   AORTIC VALVE  LVOT Vmax:   116.00 cm/s  LVOT Vmean:  64.700 cm/s  LVOT VTI:    0.268 m    AORTA  Ao Root diam: 3.30 cm  Ao Asc diam:  3.60 cm   MITRAL VALVE                  TRICUSPID VALVE  MV Area (PHT): 2.28 cm       TR Peak grad:   15.1 mmHg  MV Mean grad:  2.1 mmHg       TR Vmax:        194.00 cm/s  MV Decel Time: 333 msec       Estimated RAP:  3.00 mmHg  MR Peak grad:    104.9 mmHg   RVSP:           18.1 mmHg  MR Mean grad:    65.0 mmHg  MR Vmax:         512.00 cm/s  SHUNTS  MR Vmean:        379.0 cm/s   Systemic VTI:  0.27 m  MR PISA:         4.02 cm     Systemic Diam: 2.00 cm  MR PISA Eff ROA: 27 mm  MR PISA Radius:  0.80 cm  MV E velocity: 130.00 cm/s  MV A velocity: 101.00 cm/s  MV E/A ratio:  1.29   TEE (11/2022) IMPRESSIONS     1. Left ventricular ejection fraction, by estimation, is 55 to 60%. The  left ventricle has normal function.   2. Right ventricular systolic function is normal. The right ventricular  size is normal.   3. Left atrial size was severely dilated. No left atrial/left atrial  appendage thrombus was detected.   4. Eccentric anteriorly  directed severe mitral valve regurgitation,  prolapse of P1 scallop with flail segment. Systolic flow reversal in the  right and left upper pulmonary veins. The mitral valve is abnormal. Severe  mitral valve regurgitation. No  evidence of mitral stenosis.   5. The aortic valve is tricuspid. Aortic valve regurgitation is trivial.  No aortic stenosis is present.   6. There is mild (Grade II) plaque involving the aortic arch and  descending aorta.   7. Agitated saline contrast bubble study was positive with shunting  observed within 3-6 cardiac cycles suggestive of interatrial shunt. There  is a small patent foramen ovale with bidirectional shunting across atrial  septum.   FINDINGS   Left Ventricle: Left ventricular ejection fraction, by estimation, is 55  to 60%. The left ventricle has normal function. The left ventricular  internal cavity size was normal in size.   Right Ventricle: The right ventricular size is normal. No increase in  right ventricular wall thickness. Right ventricular systolic function is  normal.   Left Atrium: Left atrial size was severely dilated. No left atrial/left  atrial appendage thrombus was detected.   Right Atrium: Right atrial size was normal in size.   Pericardium: There is no evidence of pericardial effusion.   Mitral Valve: Eccentric anteriorly directed severe mitral valve  regurgitation, prolapse of P1 scallop with flail segment. Systolic flow  reversal in the right and left upper pulmonary veins. The mitral valve is  abnormal. Severe mitral valve regurgitation.   No evidence of mitral valve stenosis. MV peak gradient, 3.0 mmHg. The  mean mitral valve gradient is 2.0 mmHg with average heart rate of 71 bpm.   Tricuspid Valve: The tricuspid valve is grossly normal. Tricuspid valve  regurgitation is trivial.   Aortic Valve: The aortic valve is tricuspid. Aortic valve regurgitation is  trivial. No aortic stenosis is present.   Pulmonic Valve:  The pulmonic valve was normal in structure. Pulmonic valve  regurgitation is trivial.   Aorta: The aortic root and ascending aorta are structurally normal, with  no evidence of dilitation. There is mild (Grade II) plaque involving the  aortic arch and descending aorta.   Venous: A pattern of systolic flow reversal, suggestive of severe mitral  regurgitation is recorded from the left upper pulmonary vein and the right  upper pulmonary vein.   IAS/Shunts: No atrial level shunt detected by color flow Doppler. Agitated  saline contrast was given intravenously to evaluate for intracardiac  shunting. Agitated saline contrast bubble study was positive with shunting  observed within 3-6 cardiac cycles  suggestive of interatrial shunt. A small patent foramen ovale is detected  with bidirectional shunting across atrial septum.   Additional Comments: Spectral Doppler performed.   LEFT VENTRICLE  PLAX 2D  LVOT diam:     2.20  cm  LV SV:         77  LV SV Index:   34  LVOT Area:     3.80 cm     AORTIC VALVE  LVOT Vmax:   123.00 cm/s  LVOT Vmean:  71.500 cm/s  LVOT VTI:    0.202 m   MITRAL VALVE  MV Peak grad: 3.0 mmHg        SHUNTS  MV Mean grad: 2.0 mmHg        Systemic VTI:  0.20 m  MV Vmax:      0.86 m/s        Systemic Diam: 2.20 cm  MV Vmean:     59.6 cm/s  MR Peak grad:    89.9 mmHg  MR Mean grad:    64.0 mmHg  MR Vmax:         474.00 cm/s  MR Vmean:        387.0 cm/s  MR PISA:         20.36 cm  MR PISA Eff ROA: 152 mm  MR PISA Radius:  1.80 cm   CATH (11/2022) Conclusion      Ost Cx lesion is 20% stenosed.   1.  Minimal obstructive coronary artery disease of right dominant system. 2.  Fick cardiac output of 4.5 L/min and Fick cardiac index of 2.9 L/min/m with a heart following hemodynamics:             Right atrial pressure mean of 10 mmHg             Right ventricular pressure 35/2 with end-diastolic pressure of 12 mmHg             Right wedge pressure of 12 mmHg  with V waves to 15 mmHg             Left wedge pressure of 10 mmHg with V waves to 14 mmHg             Right PA pressure of 32/10 with a mean of 16 mmHg             Left PA pressure of 28/15 with a mean of 20 mmHg             PVR of around 1 Wood units             PA pulsatility index of 2.2 3.  LVEDP of 12 mmHg.    Recent Radiology Findings:       Recent Lab Findings: Lab Results  Component Value Date   WBC 8.8 12/04/2022   HGB 15.3 12/10/2022   HCT 45.0 12/10/2022   PLT 255 12/04/2022   GLUCOSE 72 12/04/2022   NA 141 12/10/2022   K 4.0 12/10/2022   CL 106 12/04/2022   CREATININE 1.28 (H) 12/04/2022   BUN 15 12/04/2022   CO2 23 12/04/2022      Assessment / Plan:     Pt is a 72 yo male with NYHA class I symptoms of severe MR from posterior P1 flail and preserved LV function with no CAD, PHTN, nor afib. Pt with bradycardia. We discussed the indications for surgery and the risks, goals and recovery from MV repair. I believe he has better than a 95% of repair however comissural P1 location is somewhat more difficult. That and his age I would prefer to perform sternotomy to get best results. Pt and wife agree and wish to proceed on 12/18.    I have  spent 60 min in review of the records, viewing studies and in face to face with patient and in coordination of future care    Eugenio Hoes 01/15/2023 10:12 AM

## 2023-01-16 ENCOUNTER — Institutional Professional Consult (permissible substitution): Payer: Medicare Other | Admitting: Thoracic Surgery (Cardiothoracic Vascular Surgery)

## 2023-01-16 ENCOUNTER — Encounter: Payer: Self-pay | Admitting: Thoracic Surgery (Cardiothoracic Vascular Surgery)

## 2023-01-16 ENCOUNTER — Encounter: Payer: Self-pay | Admitting: *Deleted

## 2023-01-16 ENCOUNTER — Other Ambulatory Visit: Payer: Self-pay | Admitting: *Deleted

## 2023-01-16 VITALS — BP 134/75 | HR 51 | Resp 20 | Ht 71.0 in | Wt 230.0 lb

## 2023-01-16 DIAGNOSIS — I340 Nonrheumatic mitral (valve) insufficiency: Secondary | ICD-10-CM | POA: Diagnosis not present

## 2023-01-16 NOTE — Patient Instructions (Signed)
MV repair 12/18

## 2023-01-17 ENCOUNTER — Encounter: Payer: Medicare Other | Admitting: Thoracic Surgery (Cardiothoracic Vascular Surgery)

## 2023-01-25 NOTE — Pre-Procedure Instructions (Signed)
Surgical Instructions   Your procedure is scheduled on January 30, 2023. Report to [facility] Main Entrance "A" at 6:30 A.M., then check in with the Admitting office. Any questions or running late day of surgery: call [phone]  Questions prior to your surgery date: call [phone], Monday-Friday, 8am-4pm. If you experience any cold or flu symptoms such as cough, fever, chills, shortness of breath, etc. between now and your scheduled surgery, please notify us at the above number.     Remember:  Do not eat or drink after midnight the night before your surgery    Take these medicines the morning of surgery with A SIP OF WATER: pantoprazole (PROTONIX)  sertraline (ZOLOFT)    May take these medicines IF NEEDED: fluticasone (FLONASE) nasal spray  loratadine (CLARITIN)    Continue taking your Aspirin through the day before surgery. DO NOT take any the morning of surgery.   One week prior to surgery, STOP taking any Aleve, Naproxen, Ibuprofen, Motrin, Advil, Goody's, BC's, all herbal medications, fish oil, and non-prescription vitamins.                     Do NOT Smoke (Tobacco/Vaping) for 24 hours prior to your procedure.  If you use a CPAP at night, you may bring your mask/headgear for your overnight stay.   You will be asked to remove any contacts, glasses, piercing's, hearing aid's, dentures/partials prior to surgery. Please bring cases for these items if needed.    Patients discharged the day of surgery will not be allowed to drive home, and someone needs to stay with them for 24 hours.  SURGICAL WAITING ROOM VISITATION Patients may have no more than 2 support people in the waiting area - these visitors may rotate.   Pre-op nurse will coordinate an appropriate time for 1 ADULT support person, who may not rotate, to accompany patient in pre-op.  Children under the age of 56 must have an adult with them who is not the patient and must remain in the main waiting area with  an adult.  If the patient needs to stay at the hospital during part of their recovery, the visitor guidelines for inpatient rooms apply.  Please refer to the [facility] website for the visitor guidelines for any additional information.   If you received a COVID test during your pre-op visit  it is requested that you wear a mask when out in public, stay away from anyone that may not be feeling well and notify your surgeon if you develop symptoms. If you have been in contact with anyone that has tested positive in the last 10 days please notify you surgeon.      Pre-operative CHG Bathing Instructions   You can play a key role in reducing the risk of infection after surgery. Your skin needs to be as free of germs as possible. You can reduce the number of germs on your skin by washing with CHG (chlorhexidine gluconate) soap before surgery. CHG is an antiseptic soap that kills germs and continues to kill germs even after washing.   DO NOT use if you have an allergy to chlorhexidine/CHG or antibacterial soaps. If your skin becomes reddened or irritated, stop using the CHG and notify one of our RNs at [phone].              TAKE A SHOWER THE NIGHT BEFORE SURGERY AND THE DAY OF SURGERY    Please keep in mind the following:  DO NOT shave,  including legs and underarms, 48 hours prior to surgery.   You may shave your face before/day of surgery.  Place clean sheets on your bed the night before surgery Use a clean washcloth (not used since being washed) for each shower. DO NOT sleep with pet's night before surgery.  CHG Shower Instructions:  Wash your face and private area with normal soap. If you choose to wash your hair, wash first with your normal shampoo.  After you use shampoo/soap, rinse your hair and body thoroughly to remove shampoo/soap residue.  Turn the water OFF and apply half the bottle of CHG soap to a CLEAN washcloth.  Apply CHG soap ONLY FROM YOUR NECK DOWN TO YOUR TOES  (washing for 3-5 minutes)  DO NOT use CHG soap on face, private areas, open wounds, or sores.  Pay special attention to the area where your surgery is being performed.  If you are having back surgery, having someone wash your back for you may be helpful. Wait 2 minutes after CHG soap is applied, then you may rinse off the CHG soap.  Pat dry with a clean towel  Put on clean pajamas    Additional instructions for the day of surgery: DO NOT APPLY any lotions, deodorants, cologne, or perfumes.   Do not wear jewelry or makeup Do not wear nail polish, gel polish, artificial nails, or any other type of covering on natural nails (fingers and toes) Do not bring valuables to the hospital. [facility] is not responsible for valuables/personal belongings. Put on clean/comfortable clothes.  Please brush your teeth.  Ask your nurse before applying any prescription medications to the skin.

## 2023-01-28 ENCOUNTER — Ambulatory Visit (HOSPITAL_BASED_OUTPATIENT_CLINIC_OR_DEPARTMENT_OTHER)
Admission: RE | Admit: 2023-01-28 | Discharge: 2023-01-28 | Disposition: A | Discharge status: Home / Self Care | Payer: Medicare Other | Source: Ambulatory Visit | Attending: Thoracic Surgery (Cardiothoracic Vascular Surgery) | Admitting: Thoracic Surgery (Cardiothoracic Vascular Surgery)

## 2023-01-28 ENCOUNTER — Other Ambulatory Visit: Payer: Self-pay

## 2023-01-28 ENCOUNTER — Ambulatory Visit (HOSPITAL_COMMUNITY)
Admission: RE | Admit: 2023-01-28 | Discharge: 2023-01-28 | Disposition: A | Discharge status: Home / Self Care | Payer: Medicare Other | Source: Ambulatory Visit | Attending: Thoracic Surgery (Cardiothoracic Vascular Surgery)

## 2023-01-28 ENCOUNTER — Encounter (HOSPITAL_COMMUNITY): Payer: Self-pay

## 2023-01-28 ENCOUNTER — Encounter (HOSPITAL_COMMUNITY)
Admission: RE | Admit: 2023-01-28 | Discharge: 2023-01-28 | Disposition: A | Discharge status: Home / Self Care | Payer: Medicare Other | Source: Ambulatory Visit | Attending: Thoracic Surgery (Cardiothoracic Vascular Surgery) | Admitting: Thoracic Surgery (Cardiothoracic Vascular Surgery)

## 2023-01-28 VITALS — BP 123/68 | HR 51 | Temp 98.1°F | Resp 19 | Ht 71.0 in | Wt 230.5 lb

## 2023-01-28 DIAGNOSIS — I340 Nonrheumatic mitral (valve) insufficiency: Secondary | ICD-10-CM | POA: Insufficient documentation

## 2023-01-28 DIAGNOSIS — Z01818 Encounter for other preprocedural examination: Secondary | ICD-10-CM | POA: Insufficient documentation

## 2023-01-28 HISTORY — DX: Personal history of urinary calculi: Z87.442

## 2023-01-28 LAB — SURGICAL PCR SCREEN
MRSA, PCR: NEGATIVE
Staphylococcus aureus: NEGATIVE

## 2023-01-28 LAB — APTT: aPTT: 30 s (ref 24–36)

## 2023-01-28 LAB — CBC
HCT: 46.7 % (ref 39.0–52.0)
Hemoglobin: 15.6 g/dL (ref 13.0–17.0)
MCH: 31.2 pg (ref 26.0–34.0)
MCHC: 33.4 g/dL (ref 30.0–36.0)
MCV: 93.4 fL (ref 80.0–100.0)
Platelets: 242 10*3/uL (ref 150–400)
RBC: 5 MIL/uL (ref 4.22–5.81)
RDW: 13.6 % (ref 11.5–15.5)
WBC: 9 10*3/uL (ref 4.0–10.5)
nRBC: 0 % (ref 0.0–0.2)

## 2023-01-28 LAB — URINALYSIS, ROUTINE W REFLEX MICROSCOPIC
Bilirubin Urine: NEGATIVE
Glucose, UA: NEGATIVE mg/dL
Hgb urine dipstick: NEGATIVE
Ketones, ur: NEGATIVE mg/dL
Leukocytes,Ua: NEGATIVE
Nitrite: NEGATIVE
Protein, ur: NEGATIVE mg/dL
Specific Gravity, Urine: 1.02 (ref 1.005–1.030)
pH: 5 (ref 5.0–8.0)

## 2023-01-28 LAB — PROTIME-INR
INR: 1.1 (ref 0.8–1.2)
Prothrombin Time: 14.3 s (ref 11.4–15.2)

## 2023-01-28 LAB — TYPE AND SCREEN
ABO/RH(D): O POS
Antibody Screen: NEGATIVE

## 2023-01-28 LAB — COMPREHENSIVE METABOLIC PANEL
ALT: 22 U/L (ref 0–44)
AST: 25 U/L (ref 15–41)
Albumin: 3.7 g/dL (ref 3.5–5.0)
Alkaline Phosphatase: 87 U/L (ref 38–126)
Anion gap: 8 (ref 5–15)
BUN: 14 mg/dL (ref 8–23)
CO2: 25 mmol/L (ref 22–32)
Calcium: 9.2 mg/dL (ref 8.9–10.3)
Chloride: 109 mmol/L (ref 98–111)
Creatinine, Ser: 1.48 mg/dL — ABNORMAL HIGH (ref 0.61–1.24)
GFR, Estimated: 50 mL/min — ABNORMAL LOW (ref >60)
Glucose, Bld: 94 mg/dL (ref 70–99)
Potassium: 3.9 mmol/L (ref 3.5–5.1)
Sodium: 142 mmol/L (ref 135–145)
Total Bilirubin: 0.8 mg/dL (ref <1.2)
Total Protein: 6.6 g/dL (ref 6.5–8.1)

## 2023-01-28 LAB — HEMOGLOBIN A1C
Hgb A1c MFr Bld: 5.3 % (ref 4.8–5.6)
Mean Plasma Glucose: 105.41 mg/dL

## 2023-01-28 NOTE — Progress Notes (Signed)
PCP - Dr. [name] Cardiologist - Dr. Kristeen Miss - last office visit 12/04/2022 Electrophysiologist - Dr. Duke Salvia - see's for chronic Bradycardia Neurologist - Dr. Porfirio Mylar Dohmeier - OSA/CPAP  PPM/ICD - Denies Device Orders - n/a Rep Notified - n/a  Chest x-ray - 01/28/2023 EKG - 01/28/2023 Stress Test - 02/21/2012 ECHO - 11/14/2022 Cardiac Cath - 12/10/2022  Sleep Study - +OSA. Pt wears CPAP nightly. Pressure setting is 7   No DM  Last dose of GLP1 agonist- n/a GLP1 instructions: n/a  Blood Thinner Instructions: n/a Aspirin Instructions: Instructed to continue taking ASA through the day before surgery and NONE the morning of surgery  NPO after midnight  COVID TEST- Yes. Result pending   Anesthesia review: Yes. Abnormal EKG review. Hx of HTN, CAD, Heart murmur, Chronic bradycardia, OSA on CPAP and Hiatal Hernia  Patient denies shortness of breath, fever, cough and chest pain at PAT appointment. Pt denies any respiratory illness/infection in the last two months.    All instructions explained to the patient, with a verbal understanding of the material. Patient agrees to go over the instructions while at home for a better understanding. Patient also instructed to self quarantine after being tested for COVID-19. The opportunity to ask questions was provided.

## 2023-01-29 LAB — SARS CORONAVIRUS 2 (TAT 6-24 HRS): SARS Coronavirus 2: NEGATIVE

## 2023-01-29 MED ORDER — TRANEXAMIC ACID 1000 MG/10ML IV SOLN
1.5000 mg/kg/h | INTRAVENOUS | Status: AC
  Administered 2023-01-30: 1.5 mg/kg/h via INTRAVENOUS
  Filled 2023-01-29: qty 25

## 2023-01-29 MED ORDER — VANCOMYCIN HCL 1000 MG IV SOLR
INTRAVENOUS | Status: DC
  Filled 2023-01-29: qty 20

## 2023-01-29 MED ORDER — VANCOMYCIN HCL 1.5 G IV SOLR
1500.0000 mg | INTRAVENOUS | Status: AC
  Administered 2023-01-30: 1500 mg via INTRAVENOUS
  Filled 2023-01-29: qty 30

## 2023-01-29 MED ORDER — HEPARIN 30,000 UNITS/1000 ML (OHS) CELLSAVER SOLUTION
Status: DC
  Filled 2023-01-29: qty 1000

## 2023-01-29 MED ORDER — INSULIN REGULAR(HUMAN) IN NACL 100-0.9 UT/100ML-% IV SOLN
INTRAVENOUS | Status: AC
  Administered 2023-01-30: 2.4 [IU]/h via INTRAVENOUS
  Filled 2023-01-29: qty 100

## 2023-01-29 MED ORDER — POTASSIUM CHLORIDE 2 MEQ/ML IV SOLN
80.0000 meq | INTRAVENOUS | Status: DC
  Filled 2023-01-29: qty 40

## 2023-01-29 MED ORDER — TRANEXAMIC ACID (OHS) PUMP PRIME SOLUTION
2.0000 mg/kg | INTRAVENOUS | Status: DC
  Filled 2023-01-29: qty 2.09

## 2023-01-29 MED ORDER — NOREPINEPHRINE 4 MG/250ML-% IV SOLN
0.0000 ug/min | INTRAVENOUS | Status: DC
  Filled 2023-01-29: qty 250

## 2023-01-29 MED ORDER — DEXMEDETOMIDINE HCL IN NACL 400 MCG/100ML IV SOLN
0.1000 ug/kg/h | INTRAVENOUS | Status: AC
  Administered 2023-01-30: .5 ug/kg/h via INTRAVENOUS
  Filled 2023-01-29: qty 100

## 2023-01-29 MED ORDER — EPINEPHRINE HCL 5 MG/250ML IV SOLN IN NS
0.0000 ug/min | INTRAVENOUS | Status: DC
  Filled 2023-01-29: qty 250

## 2023-01-29 MED ORDER — CEFAZOLIN SODIUM-DEXTROSE 2-4 GM/100ML-% IV SOLN
2.0000 g | INTRAVENOUS | Status: DC
  Filled 2023-01-29: qty 100

## 2023-01-29 MED ORDER — MANNITOL 20 % IV SOLN
INTRAVENOUS | Status: DC
  Filled 2023-01-29: qty 13

## 2023-01-29 MED ORDER — NITROGLYCERIN IN D5W 200-5 MCG/ML-% IV SOLN
2.0000 ug/min | INTRAVENOUS | Status: DC
  Filled 2023-01-29: qty 250

## 2023-01-29 MED ORDER — PHENYLEPHRINE HCL-NACL 20-0.9 MG/250ML-% IV SOLN
30.0000 ug/min | INTRAVENOUS | Status: AC
  Administered 2023-01-30: 20 ug/min via INTRAVENOUS
  Filled 2023-01-29: qty 250

## 2023-01-29 MED ORDER — TRANEXAMIC ACID (OHS) BOLUS VIA INFUSION
15.0000 mg/kg | INTRAVENOUS | Status: AC
  Administered 2023-01-30: 1569 mg via INTRAVENOUS
  Filled 2023-01-29: qty 1569

## 2023-01-29 MED ORDER — MILRINONE LACTATE IN DEXTROSE 20-5 MG/100ML-% IV SOLN
0.3000 ug/kg/min | INTRAVENOUS | Status: DC
  Filled 2023-01-29: qty 100

## 2023-01-29 MED ORDER — PLASMA-LYTE A IV SOLN
INTRAVENOUS | Status: DC
  Filled 2023-01-29: qty 2.5

## 2023-01-29 MED ORDER — CEFAZOLIN SODIUM-DEXTROSE 2-4 GM/100ML-% IV SOLN
2.0000 g | INTRAVENOUS | Status: AC
  Administered 2023-01-30 (×2): 2 g via INTRAVENOUS
  Filled 2023-01-29: qty 100

## 2023-01-29 NOTE — H&P (Signed)
[address]       [place] [address]             [phone]                                   JOHNLUKE LINDY [place] Medical Record #[id number] Date of Birth: 1950/10/10   [name], MD [name], MD   Chief Complaint: Mitral regurgitation     History of Present Illness:     Pt is a very pleasant 72 yo male who has had a murmur all his life. He started having echos in his 31s and has been followed closely for MV prolapse. Pt also with bradycardia and is followed by EP and was told at some point may need PPM. Pt recently seen by cardiology and was found on TTE to now have more severe MR. He was relatively asymptomatic but on the last clinic visit started to complain of DOE. He has no lightheadedness nor CP or lower ext edema. Pt was felt to best have better evaluation with TEE and was found to have EF of 55-60% and severe MR from P1 flail. Pt went on to have L and R heart cath with no CAD and minimal Pulm artery pressures. Pt was felt due to his increase in MR and now symptoms to best be served with MV repair             Past Medical History:  Diagnosis Date   Anal fissure     Anxiety     Aortic atherosclerosis (HCC) 04/03/2021    CT in 6/21   Convulsions (HCC)      AS A CHILD   Coronary artery calcification seen on CT scan 04/03/2021    Chest CT in 6/21   Diverticulitis     Heart murmur     Hiatal hernia with gastroesophageal reflux     HLD (hyperlipidemia)     Moderate to severe mitral regurgitation 10/06/2020    Echo 09/2021: EF 60-65, no RWMA, mild LVH, GRII DD, elevated LVEDP, normal RVSF, normal PASP, severe LAE, 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 and moderate to severe mitral regurgitation   MVP (mitral valve prolapse)      with moderate MR per echo in 2009   Normal cardiac stress test January 2014   OSA on CPAP 11/11/2013   Sleep apnea      wears CPAP   Tinnitus     Tobacco abuse     Tobacco use disorder 03/31/2014    White coat hypertension                 Past Surgical History:  Procedure Laterality Date   APPENDECTOMY   2006   HERNIA REPAIR       RIGHT/LEFT HEART CATH AND CORONARY ANGIOGRAPHY N/A 12/10/2022    Procedure: RIGHT/LEFT HEART CATH AND CORONARY ANGIOGRAPHY;  Surgeon: Orbie Pyo, MD;  Location: [facility] INVASIVE CV LAB;  Service: Cardiovascular;  Laterality: N/A;   ROTATOR CUFF REPAIR Left 2007   TEE WITHOUT CARDIOVERSION N/A 11/14/2022    Procedure: TRANSESOPHAGEAL ECHOCARDIOGRAM;  Surgeon: Parke Poisson, MD;  Location: [facility] INVASIVE CV LAB;  Service: Cardiovascular;  Laterality: N/A;          Tobacco Use History  Social History        Tobacco Use  Smoking Status Heavy Smoker  Current packs/day: 0.00   Average packs/day: 0.5 packs/day for 44.9 years (22.5 ttl pk-yrs)   Types: Cigarettes   Start date: 04/12/1967   Last attempt to quit: 03/06/2012   Years since quitting: 10.8  Smokeless Tobacco Never      Social History        Substance and Sexual Activity  Alcohol Use No   Alcohol/week: 0.0 standard drinks of alcohol      Social History         Socioeconomic History   Marital status: Married      Spouse name: Not on file   Number of children: 0   Years of education: HS   Highest education level: Not on file  Occupational History   Not on file  Tobacco Use   Smoking status: Heavy Smoker      Current packs/day: 0.00      Average packs/day: 0.5 packs/day for 44.9 years (22.5 ttl pk-yrs)      Types: Cigarettes      Start date: 04/12/1967      Last attempt to quit: 03/06/2012      Years since quitting: 10.8   Smokeless tobacco: Never  Vaping Use   Vaping status: Never Used  Substance and Sexual Activity   Alcohol use: No      Alcohol/week: 0.0 standard drinks of alcohol   Drug use: No   Sexual activity: Yes  Other Topics Concern   Not on file  Social History Narrative    Patient is married, does not have children.    Patient is right handed.     Patient has high school education.    Patient does not drink caffeine.    Social Determinants of Health    Financial Resource Strain: Not on file  Food Insecurity: Not on file  Transportation Needs: Not on file  Physical Activity: Not on file  Stress: Not on file  Social Connections: Not on file  Intimate Partner Violence: Not on file      Allergies  No Known Allergies           Current Outpatient Medications  Medication Sig Dispense Refill   aspirin 81 MG tablet Take 81 mg by mouth daily.       B Complex CAPS Take 1 capsule by mouth daily.       cholecalciferol (VITAMIN D) 1000 UNITS tablet Take 1,000 Units by mouth daily.       FIBER COMPLETE PO Take 2 tablets by mouth daily.       fluticasone (FLONASE) 50 MCG/ACT nasal spray Place 2 sprays into both nostrils daily as needed for allergies.       loratadine (CLARITIN) 10 MG tablet Take 10 mg by mouth daily as needed for allergies.       losartan (COZAAR) 25 MG tablet Take 50 mg by mouth at bedtime.        pantoprazole (PROTONIX) 40 MG tablet Take 40 mg by mouth daily.       sertraline (ZOLOFT) 50 MG tablet Take 50 mg by mouth daily.       simvastatin (ZOCOR) 20 MG tablet Take 20 mg by mouth at bedtime.       tamsulosin (FLOMAX) 0.4 MG CAPS capsule Take 0.4 mg by mouth every evening.          No current facility-administered medications for this visit.               Family History  Problem Relation Age of  Onset   Cancer Mother     Cancer Other 81        PANCREATIC CANCER   Hypertension Other     Sleep apnea Brother          CPAP                Physical Exam: Healthy appearing Lungs: clear Teeth in good repair Card: RR with 2/6 sem Ext: no edema Neuro: alert and oriented         Diagnostic Studies & Laboratory data: I have personally reviewed the following studies and agree with the findings   TTE (10/2022) IMPRESSIONS     1. Eccentric, anteriorly directed severe mitral valve regurgitation with   splay artifact and systolic flow reversals in the pulmonary veins. Suspect  P2 scallop prolapse and flail. Recommend TEE. The mitral valve is  abnormal. Severe mitral valve  regurgitation. No evidence of mitral stenosis.   2. Left ventricular ejection fraction, by estimation, is 55 to 60%. The  left ventricle has normal function. The left ventricle has no regional  wall motion abnormalities. There is mild left ventricular hypertrophy.  Left ventricular diastolic parameters  are indeterminate.   3. Right ventricular systolic function is normal. The right ventricular  size is normal. There is normal pulmonary artery systolic pressure. The  estimated right ventricular systolic pressure is 18.1 mmHg.   4. Left atrial size was severely dilated.   5. The aortic valve is tricuspid. There is mild calcification of the  aortic valve. Aortic valve regurgitation is not visualized. No aortic  stenosis is present.   6. The inferior vena cava is normal in size with greater than 50%  respiratory variability, suggesting right atrial pressure of 3 mmHg.   Comparison(s): No significant change from prior study despite difference  in reporting. Prior images reviewed side by side.   Conclusion(s)/Recommendation(s): Consider TEE to further evalute for  mitral valve posterior leaflet flail.   FINDINGS   Left Ventricle: Left ventricular ejection fraction, by estimation, is 55  to 60%. The left ventricle has normal function. The left ventricle has no  regional wall motion abnormalities. Global longitudinal strain performed  but not reported based on  interpreter judgement due to suboptimal tracking. 3D ejection fraction  reviewed and evaluated as part of the interpretation. Alternate  measurement of EF is felt to be most reflective of LV function. The left  ventricular internal cavity size was normal in   size. There is mild left ventricular hypertrophy. Left ventricular  diastolic function could not be  evaluated due to mitral regurgitation  (moderate or greater). Left ventricular diastolic parameters are  indeterminate.   Right Ventricle: The right ventricular size is normal. No increase in  right ventricular wall thickness. Right ventricular systolic function is  normal. There is normal pulmonary artery systolic pressure. The tricuspid  regurgitant velocity is 1.94 m/s, and   with an assumed right atrial pressure of 3 mmHg, the estimated right  ventricular systolic pressure is 18.1 mmHg.   Left Atrium: Left atrial size was severely dilated.   Right Atrium: Right atrial size was normal in size.   Pericardium: There is no evidence of pericardial effusion. Presence of  epicardial fat layer.   Mitral Valve: Eccentric, anteriorly directed severe mitral valve  regurgitation with splay artifact and systolic flow reversals in the  pulmonary veins. Suspect P2 scallop prolapse and flail. Recommend TEE. The  mitral valve is abnormal. Severe mitral valve   regurgitation. No evidence  of mitral valve stenosis. The mean mitral  valve gradient is 2.1 mmHg with average heart rate of 41 bpm.   Tricuspid Valve: The tricuspid valve is normal in structure. Tricuspid  valve regurgitation is not demonstrated. No evidence of tricuspid  stenosis.   Aortic Valve: The aortic valve is tricuspid. There is mild calcification  of the aortic valve. Aortic valve regurgitation is not visualized. No  aortic stenosis is present.   Pulmonic Valve: The pulmonic valve was normal in structure. Pulmonic valve  regurgitation is not visualized. No evidence of pulmonic stenosis.   Aorta: The aortic root is normal in size and structure.   Venous: A pattern of systolic flow reversal, suggestive of severe mitral  regurgitation is recorded from the right lower pulmonary vein. The  inferior vena cava is normal in size with greater than 50% respiratory  variability, suggesting right atrial  pressure of 3 mmHg.    IAS/Shunts: No atrial level shunt detected by color flow Doppler.     LEFT VENTRICLE  PLAX 2D  LVIDd:         5.60 cm   Diastology  LVIDs:         3.00 cm   LV e' medial:    6.96 cm/s  LV PW:         1.20 cm   LV E/e' medial:  18.7  LV IVS:        1.20 cm   LV e' lateral:   10.40 cm/s  LVOT diam:     2.00 cm   LV E/e' lateral: 12.5  LV SV:         84  LV SV Index:   38  LVOT Area:     3.14 cm                             3D Volume EF:                           LV EDV:       244 ml                           LV ESV:       71 ml                           LV SV:        173 ml   RIGHT VENTRICLE  RV Basal diam:  2.70 cm  RV Mid diam:    2.60 cm  RV S prime:     19.10 cm/s  TAPSE (M-mode): 2.3 cm  RVSP:           18.1 mmHg   LEFT ATRIUM              Index        RIGHT ATRIUM           Index  LA diam:        6.00 cm  2.69 cm/m   RA Pressure: 3.00 mmHg  LA Vol (A2C):   122.0 ml 54.73 ml/m  RA Area:     15.00 cm  LA Vol (A4C):   116.0 ml 52.03 ml/m  RA Volume:   34.10 ml  15.30 ml/m  LA Biplane Vol: 120.0 ml 53.83 ml/m   AORTIC VALVE  LVOT  Vmax:   116.00 cm/s  LVOT Vmean:  64.700 cm/s  LVOT VTI:    0.268 m    AORTA  Ao Root diam: 3.30 cm  Ao Asc diam:  3.60 cm   MITRAL VALVE                  TRICUSPID VALVE  MV Area (PHT): 2.28 cm       TR Peak grad:   15.1 mmHg  MV Mean grad:  2.1 mmHg       TR Vmax:        194.00 cm/s  MV Decel Time: 333 msec       Estimated RAP:  3.00 mmHg  MR Peak grad:    104.9 mmHg   RVSP:           18.1 mmHg  MR Mean grad:    65.0 mmHg  MR Vmax:         512.00 cm/s  SHUNTS  MR Vmean:        379.0 cm/s   Systemic VTI:  0.27 m  MR PISA:         4.02 cm     Systemic Diam: 2.00 cm  MR PISA Eff ROA: 27 mm  MR PISA Radius:  0.80 cm  MV E velocity: 130.00 cm/s  MV A velocity: 101.00 cm/s  MV E/A ratio:  1.29    TEE (11/2022) IMPRESSIONS     1. Left ventricular ejection fraction, by estimation, is 55 to 60%. The  left ventricle has normal  function.   2. Right ventricular systolic function is normal. The right ventricular  size is normal.   3. Left atrial size was severely dilated. No left atrial/left atrial  appendage thrombus was detected.   4. Eccentric anteriorly directed severe mitral valve regurgitation,  prolapse of P1 scallop with flail segment. Systolic flow reversal in the  right and left upper pulmonary veins. The mitral valve is abnormal. Severe  mitral valve regurgitation. No  evidence of mitral stenosis.   5. The aortic valve is tricuspid. Aortic valve regurgitation is trivial.  No aortic stenosis is present.   6. There is mild (Grade II) plaque involving the aortic arch and  descending aorta.   7. Agitated saline contrast bubble study was positive with shunting  observed within 3-6 cardiac cycles suggestive of interatrial shunt. There  is a small patent foramen ovale with bidirectional shunting across atrial  septum.   FINDINGS   Left Ventricle: Left ventricular ejection fraction, by estimation, is 55  to 60%. The left ventricle has normal function. The left ventricular  internal cavity size was normal in size.   Right Ventricle: The right ventricular size is normal. No increase in  right ventricular wall thickness. Right ventricular systolic function is  normal.   Left Atrium: Left atrial size was severely dilated. No left atrial/left  atrial appendage thrombus was detected.   Right Atrium: Right atrial size was normal in size.   Pericardium: There is no evidence of pericardial effusion.   Mitral Valve: Eccentric anteriorly directed severe mitral valve  regurgitation, prolapse of P1 scallop with flail segment. Systolic flow  reversal in the right and left upper pulmonary veins. The mitral valve is  abnormal. Severe mitral valve regurgitation.   No evidence of mitral valve stenosis. MV peak gradient, 3.0 mmHg. The  mean mitral valve gradient is 2.0 mmHg with average heart rate of 71 bpm.    Tricuspid Valve: The tricuspid valve is  grossly normal. Tricuspid valve  regurgitation is trivial.   Aortic Valve: The aortic valve is tricuspid. Aortic valve regurgitation is  trivial. No aortic stenosis is present.   Pulmonic Valve: The pulmonic valve was normal in structure. Pulmonic valve  regurgitation is trivial.   Aorta: The aortic root and ascending aorta are structurally normal, with  no evidence of dilitation. There is mild (Grade II) plaque involving the  aortic arch and descending aorta.   Venous: A pattern of systolic flow reversal, suggestive of severe mitral  regurgitation is recorded from the left upper pulmonary vein and the right  upper pulmonary vein.   IAS/Shunts: No atrial level shunt detected by color flow Doppler. Agitated  saline contrast was given intravenously to evaluate for intracardiac  shunting. Agitated saline contrast bubble study was positive with shunting  observed within 3-6 cardiac cycles  suggestive of interatrial shunt. A small patent foramen ovale is detected  with bidirectional shunting across atrial septum.   Additional Comments: Spectral Doppler performed.   LEFT VENTRICLE  PLAX 2D  LVOT diam:     2.20 cm  LV SV:         77  LV SV Index:   34  LVOT Area:     3.80 cm     AORTIC VALVE  LVOT Vmax:   123.00 cm/s  LVOT Vmean:  71.500 cm/s  LVOT VTI:    0.202 m   MITRAL VALVE  MV Peak grad: 3.0 mmHg        SHUNTS  MV Mean grad: 2.0 mmHg        Systemic VTI:  0.20 m  MV Vmax:      0.86 m/s        Systemic Diam: 2.20 cm  MV Vmean:     59.6 cm/s  MR Peak grad:    89.9 mmHg  MR Mean grad:    64.0 mmHg  MR Vmax:         474.00 cm/s  MR Vmean:        387.0 cm/s  MR PISA:         20.36 cm  MR PISA Eff ROA: 152 mm  MR PISA Radius:  1.80 cm    CATH (11/2022) Conclusion       Ost Cx lesion is 20% stenosed.   1.  Minimal obstructive coronary artery disease of right dominant system. 2.  Fick cardiac output of 4.5 L/min and Fick  cardiac index of 2.9 L/min/m with a heart following hemodynamics:             Right atrial pressure mean of 10 mmHg             Right ventricular pressure 35/2 with end-diastolic pressure of 12 mmHg             Right wedge pressure of 12 mmHg with V waves to 15 mmHg             Left wedge pressure of 10 mmHg with V waves to 14 mmHg             Right PA pressure of 32/10 with a mean of 16 mmHg             Left PA pressure of 28/15 with a mean of 20 mmHg             PVR of around 1 Wood units             PA pulsatility index  of 2.2 3.  LVEDP of 12 mmHg.    Recent Radiology Findings:        Recent Lab Findings: Recent Labs       Lab Results  Component Value Date    WBC 8.8 12/04/2022    HGB 15.3 12/10/2022    HCT 45.0 12/10/2022    PLT 255 12/04/2022    GLUCOSE 72 12/04/2022    NA 141 12/10/2022    K 4.0 12/10/2022    CL 106 12/04/2022    CREATININE 1.28 (H) 12/04/2022    BUN 15 12/04/2022    CO2 23 12/04/2022            Assessment / Plan:     Pt is a 72 yo male with NYHA class I symptoms of severe MR from posterior P1 flail and preserved LV function with no CAD, PHTN, nor afib. Pt with bradycardia. We discussed the indications for surgery and the risks, goals and recovery from MV repair. I believe he has better than a 95% of repair however comissural P1 location is somewhat more difficult. That and his age I would prefer to perform sternotomy to get best results. Pt and wife agree and wish to proceed on 12/18.

## 2023-01-30 ENCOUNTER — Inpatient Hospital Stay (HOSPITAL_COMMUNITY): Payer: Medicare Other | Admitting: Anesthesiology

## 2023-01-30 ENCOUNTER — Inpatient Hospital Stay (HOSPITAL_COMMUNITY)
Admission: RE | Admit: 2023-01-30 | Discharge: 2023-02-05 | DRG: 220 | Disposition: A | Discharge status: Home Health | Payer: Medicare Other | Attending: Thoracic Surgery (Cardiothoracic Vascular Surgery) | Admitting: Thoracic Surgery (Cardiothoracic Vascular Surgery)

## 2023-01-30 ENCOUNTER — Encounter (HOSPITAL_COMMUNITY): Payer: Self-pay | Admitting: Thoracic Surgery (Cardiothoracic Vascular Surgery)

## 2023-01-30 ENCOUNTER — Inpatient Hospital Stay (HOSPITAL_COMMUNITY): Payer: Medicare Other

## 2023-01-30 ENCOUNTER — Other Ambulatory Visit: Payer: Self-pay

## 2023-01-30 ENCOUNTER — Encounter (HOSPITAL_COMMUNITY)
Admission: RE | Disposition: A | Discharge status: Home / Self Care | Payer: Self-pay | Source: Home / Self Care | Attending: Thoracic Surgery (Cardiothoracic Vascular Surgery)

## 2023-01-30 DIAGNOSIS — I482 Chronic atrial fibrillation, unspecified: Secondary | ICD-10-CM | POA: Diagnosis not present

## 2023-01-30 DIAGNOSIS — R0689 Other abnormalities of breathing: Secondary | ICD-10-CM | POA: Diagnosis not present

## 2023-01-30 DIAGNOSIS — N179 Acute kidney failure, unspecified: Secondary | ICD-10-CM | POA: Diagnosis not present

## 2023-01-30 DIAGNOSIS — Z23 Encounter for immunization: Secondary | ICD-10-CM | POA: Diagnosis present

## 2023-01-30 DIAGNOSIS — I491 Atrial premature depolarization: Secondary | ICD-10-CM | POA: Diagnosis not present

## 2023-01-30 DIAGNOSIS — Z7982 Long term (current) use of aspirin: Secondary | ICD-10-CM | POA: Diagnosis not present

## 2023-01-30 DIAGNOSIS — F17210 Nicotine dependence, cigarettes, uncomplicated: Secondary | ICD-10-CM | POA: Diagnosis present

## 2023-01-30 DIAGNOSIS — Z79899 Other long term (current) drug therapy: Secondary | ICD-10-CM

## 2023-01-30 DIAGNOSIS — E871 Hypo-osmolality and hyponatremia: Secondary | ICD-10-CM | POA: Diagnosis not present

## 2023-01-30 DIAGNOSIS — Z87891 Personal history of nicotine dependence: Secondary | ICD-10-CM | POA: Diagnosis not present

## 2023-01-30 DIAGNOSIS — G4733 Obstructive sleep apnea (adult) (pediatric): Secondary | ICD-10-CM | POA: Diagnosis present

## 2023-01-30 DIAGNOSIS — I10 Essential (primary) hypertension: Secondary | ICD-10-CM | POA: Diagnosis present

## 2023-01-30 DIAGNOSIS — I700 Atherosclerosis of aorta: Secondary | ICD-10-CM | POA: Diagnosis present

## 2023-01-30 DIAGNOSIS — I480 Paroxysmal atrial fibrillation: Secondary | ICD-10-CM | POA: Diagnosis present

## 2023-01-30 DIAGNOSIS — Q2112 Patent foramen ovale: Secondary | ICD-10-CM | POA: Diagnosis not present

## 2023-01-30 DIAGNOSIS — E785 Hyperlipidemia, unspecified: Secondary | ICD-10-CM | POA: Diagnosis present

## 2023-01-30 DIAGNOSIS — J9811 Atelectasis: Secondary | ICD-10-CM | POA: Diagnosis not present

## 2023-01-30 DIAGNOSIS — I340 Nonrheumatic mitral (valve) insufficiency: Secondary | ICD-10-CM

## 2023-01-30 DIAGNOSIS — I341 Nonrheumatic mitral (valve) prolapse: Secondary | ICD-10-CM | POA: Diagnosis present

## 2023-01-30 DIAGNOSIS — F419 Anxiety disorder, unspecified: Secondary | ICD-10-CM | POA: Diagnosis present

## 2023-01-30 DIAGNOSIS — D72829 Elevated white blood cell count, unspecified: Secondary | ICD-10-CM | POA: Diagnosis not present

## 2023-01-30 DIAGNOSIS — N400 Enlarged prostate without lower urinary tract symptoms: Secondary | ICD-10-CM | POA: Diagnosis present

## 2023-01-30 DIAGNOSIS — Z6832 Body mass index (BMI) 32.0-32.9, adult: Secondary | ICD-10-CM

## 2023-01-30 DIAGNOSIS — Z713 Dietary counseling and surveillance: Secondary | ICD-10-CM

## 2023-01-30 DIAGNOSIS — I493 Ventricular premature depolarization: Secondary | ICD-10-CM | POA: Diagnosis not present

## 2023-01-30 DIAGNOSIS — T502X5A Adverse effect of carbonic-anhydrase inhibitors, benzothiadiazides and other diuretics, initial encounter: Secondary | ICD-10-CM | POA: Diagnosis not present

## 2023-01-30 DIAGNOSIS — I2510 Atherosclerotic heart disease of native coronary artery without angina pectoris: Secondary | ICD-10-CM | POA: Diagnosis present

## 2023-01-30 DIAGNOSIS — J90 Pleural effusion, not elsewhere classified: Secondary | ICD-10-CM | POA: Diagnosis not present

## 2023-01-30 DIAGNOSIS — E669 Obesity, unspecified: Secondary | ICD-10-CM | POA: Diagnosis present

## 2023-01-30 DIAGNOSIS — Z8249 Family history of ischemic heart disease and other diseases of the circulatory system: Secondary | ICD-10-CM

## 2023-01-30 DIAGNOSIS — Z4889 Encounter for other specified surgical aftercare: Secondary | ICD-10-CM | POA: Diagnosis not present

## 2023-01-30 DIAGNOSIS — R001 Bradycardia, unspecified: Secondary | ICD-10-CM | POA: Diagnosis present

## 2023-01-30 DIAGNOSIS — Z9889 Other specified postprocedural states: Secondary | ICD-10-CM | POA: Diagnosis not present

## 2023-01-30 DIAGNOSIS — Z85828 Personal history of other malignant neoplasm of skin: Secondary | ICD-10-CM

## 2023-01-30 HISTORY — PX: MITRAL VALVE REPAIR: SHX2039

## 2023-01-30 HISTORY — PX: TEE WITHOUT CARDIOVERSION: SHX5443

## 2023-01-30 LAB — POCT I-STAT 7, (LYTES, BLD GAS, ICA,H+H)
Acid-Base Excess: 0 mmol/L (ref 0.0–2.0)
Acid-base deficit: 2 mmol/L (ref 0.0–2.0)
Acid-base deficit: 3 mmol/L — ABNORMAL HIGH (ref 0.0–2.0)
Acid-base deficit: 3 mmol/L — ABNORMAL HIGH (ref 0.0–2.0)
Acid-base deficit: 3 mmol/L — ABNORMAL HIGH (ref 0.0–2.0)
Acid-base deficit: 6 mmol/L — ABNORMAL HIGH (ref 0.0–2.0)
Acid-base deficit: 1 mmol/L (ref 0.0–2.0)
Acid-base deficit: 3 mmol/L — ABNORMAL HIGH (ref 0.0–2.0)
Acid-base deficit: 3 mmol/L — ABNORMAL HIGH (ref 0.0–2.0)
Acid-base deficit: 2 mmol/L (ref 0.0–2.0)
Bicarbonate: 24.4 mmol/L (ref 20.0–28.0)
Bicarbonate: 23 mmol/L (ref 20.0–28.0)
Bicarbonate: 25 mmol/L (ref 20.0–28.0)
Bicarbonate: 23.3 mmol/L (ref 20.0–28.0)
Bicarbonate: 25 mmol/L (ref 20.0–28.0)
Bicarbonate: 20.7 mmol/L (ref 20.0–28.0)
Bicarbonate: 23.4 mmol/L (ref 20.0–28.0)
Bicarbonate: 24.6 mmol/L (ref 20.0–28.0)
Bicarbonate: 23.8 mmol/L (ref 20.0–28.0)
Bicarbonate: 24.3 mmol/L (ref 20.0–28.0)
Calcium, Ion: 1.25 mmol/L (ref 1.15–1.40)
Calcium, Ion: 1.04 mmol/L — ABNORMAL LOW (ref 1.15–1.40)
Calcium, Ion: 1.1 mmol/L — ABNORMAL LOW (ref 1.15–1.40)
Calcium, Ion: 1.1 mmol/L — ABNORMAL LOW (ref 1.15–1.40)
Calcium, Ion: 1.12 mmol/L — ABNORMAL LOW (ref 1.15–1.40)
Calcium, Ion: 1.05 mmol/L — ABNORMAL LOW (ref 1.15–1.40)
Calcium, Ion: 1.06 mmol/L — ABNORMAL LOW (ref 1.15–1.40)
Calcium, Ion: 1.11 mmol/L — ABNORMAL LOW (ref 1.15–1.40)
Calcium, Ion: 1.11 mmol/L — ABNORMAL LOW (ref 1.15–1.40)
Calcium, Ion: 1.11 mmol/L — ABNORMAL LOW (ref 1.15–1.40)
HCT: 43 % (ref 39.0–52.0)
HCT: 33 % — ABNORMAL LOW (ref 39.0–52.0)
HCT: 35 % — ABNORMAL LOW (ref 39.0–52.0)
HCT: 34 % — ABNORMAL LOW (ref 39.0–52.0)
HCT: 42 % (ref 39.0–52.0)
HCT: 41 % (ref 39.0–52.0)
HCT: 42 % (ref 39.0–52.0)
HCT: 44 % (ref 39.0–52.0)
HCT: 43 % (ref 39.0–52.0)
HCT: 44 % (ref 39.0–52.0)
Hemoglobin: 14.6 g/dL (ref 13.0–17.0)
Hemoglobin: 11.2 g/dL — ABNORMAL LOW (ref 13.0–17.0)
Hemoglobin: 11.9 g/dL — ABNORMAL LOW (ref 13.0–17.0)
Hemoglobin: 11.6 g/dL — ABNORMAL LOW (ref 13.0–17.0)
Hemoglobin: 14.3 g/dL (ref 13.0–17.0)
Hemoglobin: 13.9 g/dL (ref 13.0–17.0)
Hemoglobin: 14.3 g/dL (ref 13.0–17.0)
Hemoglobin: 15 g/dL (ref 13.0–17.0)
Hemoglobin: 14.6 g/dL (ref 13.0–17.0)
Hemoglobin: 15 g/dL (ref 13.0–17.0)
O2 Saturation: 100 %
O2 Saturation: 100 %
O2 Saturation: 100 %
O2 Saturation: 100 %
O2 Saturation: 93 %
O2 Saturation: 98 %
O2 Saturation: 98 %
O2 Saturation: 98 %
O2 Saturation: 98 %
O2 Saturation: 94 %
Patient temperature: 36.4
Patient temperature: 36.5
Patient temperature: 36.7
Patient temperature: 36.8
Patient temperature: 36.8
Patient temperature: 36.8
Potassium: 4 mmol/L (ref 3.5–5.1)
Potassium: 4.3 mmol/L (ref 3.5–5.1)
Potassium: 4.8 mmol/L (ref 3.5–5.1)
Potassium: 4.5 mmol/L (ref 3.5–5.1)
Potassium: 4.4 mmol/L (ref 3.5–5.1)
Potassium: 4.2 mmol/L (ref 3.5–5.1)
Potassium: 4.2 mmol/L (ref 3.5–5.1)
Potassium: 4.6 mmol/L (ref 3.5–5.1)
Potassium: 4.5 mmol/L (ref 3.5–5.1)
Potassium: 4.8 mmol/L (ref 3.5–5.1)
Sodium: 141 mmol/L (ref 135–145)
Sodium: 141 mmol/L (ref 135–145)
Sodium: 140 mmol/L (ref 135–145)
Sodium: 140 mmol/L (ref 135–145)
Sodium: 140 mmol/L (ref 135–145)
Sodium: 141 mmol/L (ref 135–145)
Sodium: 140 mmol/L (ref 135–145)
Sodium: 141 mmol/L (ref 135–145)
Sodium: 141 mmol/L (ref 135–145)
Sodium: 140 mmol/L (ref 135–145)
TCO2: 26 mmol/L (ref 22–32)
TCO2: 24 mmol/L (ref 22–32)
TCO2: 26 mmol/L (ref 22–32)
TCO2: 25 mmol/L (ref 22–32)
TCO2: 27 mmol/L (ref 22–32)
TCO2: 22 mmol/L (ref 22–32)
TCO2: 25 mmol/L (ref 22–32)
TCO2: 26 mmol/L (ref 22–32)
TCO2: 25 mmol/L (ref 22–32)
TCO2: 26 mmol/L (ref 22–32)
pCO2 arterial: 46.8 mm[Hg] (ref 32–48)
pCO2 arterial: 44.4 mm[Hg] (ref 32–48)
pCO2 arterial: 41.8 mm[Hg] (ref 32–48)
pCO2 arterial: 43.8 mm[Hg] (ref 32–48)
pCO2 arterial: 53 mm[Hg] — ABNORMAL HIGH (ref 32–48)
pCO2 arterial: 41.6 mm[Hg] (ref 32–48)
pCO2 arterial: 44.7 mm[Hg] (ref 32–48)
pCO2 arterial: 44.7 mm[Hg] (ref 32–48)
pCO2 arterial: 47.1 mm[Hg] (ref 32–48)
pCO2 arterial: 47.4 mm[Hg] (ref 32–48)
pH, Arterial: 7.325 — ABNORMAL LOW (ref 7.35–7.45)
pH, Arterial: 7.322 — ABNORMAL LOW (ref 7.35–7.45)
pH, Arterial: 7.384 (ref 7.35–7.45)
pH, Arterial: 7.335 — ABNORMAL LOW (ref 7.35–7.45)
pH, Arterial: 7.279 — ABNORMAL LOW (ref 7.35–7.45)
pH, Arterial: 7.302 — ABNORMAL LOW (ref 7.35–7.45)
pH, Arterial: 7.326 — ABNORMAL LOW (ref 7.35–7.45)
pH, Arterial: 7.348 — ABNORMAL LOW (ref 7.35–7.45)
pH, Arterial: 7.311 — ABNORMAL LOW (ref 7.35–7.45)
pH, Arterial: 7.317 — ABNORMAL LOW (ref 7.35–7.45)
pO2, Arterial: 190 mm[Hg] — ABNORMAL HIGH (ref 83–108)
pO2, Arterial: 343 mm[Hg] — ABNORMAL HIGH (ref 83–108)
pO2, Arterial: 345 mm[Hg] — ABNORMAL HIGH (ref 83–108)
pO2, Arterial: 358 mm[Hg] — ABNORMAL HIGH (ref 83–108)
pO2, Arterial: 75 mm[Hg] — ABNORMAL LOW (ref 83–108)
pO2, Arterial: 119 mm[Hg] — ABNORMAL HIGH (ref 83–108)
pO2, Arterial: 107 mm[Hg] (ref 83–108)
pO2, Arterial: 119 mm[Hg] — ABNORMAL HIGH (ref 83–108)
pO2, Arterial: 116 mm[Hg] — ABNORMAL HIGH (ref 83–108)
pO2, Arterial: 77 mm[Hg] — ABNORMAL LOW (ref 83–108)

## 2023-01-30 LAB — CBC
HCT: 42.2 % (ref 39.0–52.0)
HCT: 44.9 % (ref 39.0–52.0)
Hemoglobin: 14.2 g/dL (ref 13.0–17.0)
Hemoglobin: 15.1 g/dL (ref 13.0–17.0)
MCH: 31.8 pg (ref 26.0–34.0)
MCH: 31.7 pg (ref 26.0–34.0)
MCHC: 33.6 g/dL (ref 30.0–36.0)
MCHC: 33.6 g/dL (ref 30.0–36.0)
MCV: 94.4 fL (ref 80.0–100.0)
MCV: 94.1 fL (ref 80.0–100.0)
Platelets: 177 10*3/uL (ref 150–400)
Platelets: 213 10*3/uL (ref 150–400)
RBC: 4.47 MIL/uL (ref 4.22–5.81)
RBC: 4.77 MIL/uL (ref 4.22–5.81)
RDW: 13.6 % (ref 11.5–15.5)
RDW: 13.6 % (ref 11.5–15.5)
WBC: 21.6 10*3/uL — ABNORMAL HIGH (ref 4.0–10.5)
WBC: 21.3 10*3/uL — ABNORMAL HIGH (ref 4.0–10.5)
nRBC: 0 % (ref 0.0–0.2)
nRBC: 0 % (ref 0.0–0.2)

## 2023-01-30 LAB — POCT I-STAT, CHEM 8
BUN: 14 mg/dL (ref 8–23)
BUN: 14 mg/dL (ref 8–23)
BUN: 14 mg/dL (ref 8–23)
BUN: 14 mg/dL (ref 8–23)
Calcium, Ion: 1.22 mmol/L (ref 1.15–1.40)
Calcium, Ion: 1.2 mmol/L (ref 1.15–1.40)
Calcium, Ion: 1.09 mmol/L — ABNORMAL LOW (ref 1.15–1.40)
Calcium, Ion: 1.09 mmol/L — ABNORMAL LOW (ref 1.15–1.40)
Chloride: 105 mmol/L (ref 98–111)
Chloride: 105 mmol/L (ref 98–111)
Chloride: 104 mmol/L (ref 98–111)
Chloride: 105 mmol/L (ref 98–111)
Creatinine, Ser: 1.3 mg/dL — ABNORMAL HIGH (ref 0.61–1.24)
Creatinine, Ser: 1.2 mg/dL (ref 0.61–1.24)
Creatinine, Ser: 1.1 mg/dL (ref 0.61–1.24)
Creatinine, Ser: 1.1 mg/dL (ref 0.61–1.24)
Glucose, Bld: 127 mg/dL — ABNORMAL HIGH (ref 70–99)
Glucose, Bld: 103 mg/dL — ABNORMAL HIGH (ref 70–99)
Glucose, Bld: 120 mg/dL — ABNORMAL HIGH (ref 70–99)
Glucose, Bld: 140 mg/dL — ABNORMAL HIGH (ref 70–99)
HCT: 44 % (ref 39.0–52.0)
HCT: 40 % (ref 39.0–52.0)
HCT: 33 % — ABNORMAL LOW (ref 39.0–52.0)
HCT: 35 % — ABNORMAL LOW (ref 39.0–52.0)
Hemoglobin: 15 g/dL (ref 13.0–17.0)
Hemoglobin: 13.6 g/dL (ref 13.0–17.0)
Hemoglobin: 11.2 g/dL — ABNORMAL LOW (ref 13.0–17.0)
Hemoglobin: 11.9 g/dL — ABNORMAL LOW (ref 13.0–17.0)
Potassium: 4 mmol/L (ref 3.5–5.1)
Potassium: 4.1 mmol/L (ref 3.5–5.1)
Potassium: 4.6 mmol/L (ref 3.5–5.1)
Potassium: 4.6 mmol/L (ref 3.5–5.1)
Sodium: 141 mmol/L (ref 135–145)
Sodium: 141 mmol/L (ref 135–145)
Sodium: 141 mmol/L (ref 135–145)
Sodium: 140 mmol/L (ref 135–145)
TCO2: 24 mmol/L (ref 22–32)
TCO2: 23 mmol/L (ref 22–32)
TCO2: 27 mmol/L (ref 22–32)
TCO2: 25 mmol/L (ref 22–32)

## 2023-01-30 LAB — GLUCOSE, CAPILLARY
Glucose-Capillary: 138 mg/dL — ABNORMAL HIGH (ref 70–99)
Glucose-Capillary: 161 mg/dL — ABNORMAL HIGH (ref 70–99)
Glucose-Capillary: 129 mg/dL — ABNORMAL HIGH (ref 70–99)
Glucose-Capillary: 131 mg/dL — ABNORMAL HIGH (ref 70–99)
Glucose-Capillary: 131 mg/dL — ABNORMAL HIGH (ref 70–99)
Glucose-Capillary: 125 mg/dL — ABNORMAL HIGH (ref 70–99)
Glucose-Capillary: 123 mg/dL — ABNORMAL HIGH (ref 70–99)
Glucose-Capillary: 105 mg/dL — ABNORMAL HIGH (ref 70–99)

## 2023-01-30 LAB — BASIC METABOLIC PANEL
Anion gap: 10 (ref 5–15)
BUN: 15 mg/dL (ref 8–23)
CO2: 21 mmol/L — ABNORMAL LOW (ref 22–32)
Calcium: 8 mg/dL — ABNORMAL LOW (ref 8.9–10.3)
Chloride: 109 mmol/L (ref 98–111)
Creatinine, Ser: 1.63 mg/dL — ABNORMAL HIGH (ref 0.61–1.24)
GFR, Estimated: 44 mL/min — ABNORMAL LOW (ref >60)
Glucose, Bld: 129 mg/dL — ABNORMAL HIGH (ref 70–99)
Potassium: 4.8 mmol/L (ref 3.5–5.1)
Sodium: 140 mmol/L (ref 135–145)

## 2023-01-30 LAB — ECHO INTRAOPERATIVE TEE
Height: 71 in
MV Vena cont: 0.55 cm
Weight: 3680 [oz_av]

## 2023-01-30 LAB — POCT I-STAT EG7
Acid-base deficit: 3 mmol/L — ABNORMAL HIGH (ref 0.0–2.0)
Acid-base deficit: 1 mmol/L (ref 0.0–2.0)
Bicarbonate: 24 mmol/L (ref 20.0–28.0)
Bicarbonate: 24.3 mmol/L (ref 20.0–28.0)
Calcium, Ion: 1.1 mmol/L — ABNORMAL LOW (ref 1.15–1.40)
Calcium, Ion: 1.12 mmol/L — ABNORMAL LOW (ref 1.15–1.40)
HCT: 33 % — ABNORMAL LOW (ref 39.0–52.0)
HCT: 42 % (ref 39.0–52.0)
Hemoglobin: 11.2 g/dL — ABNORMAL LOW (ref 13.0–17.0)
Hemoglobin: 14.3 g/dL (ref 13.0–17.0)
O2 Saturation: 85 %
O2 Saturation: 97 %
Potassium: 4.7 mmol/L (ref 3.5–5.1)
Potassium: 4.7 mmol/L (ref 3.5–5.1)
Sodium: 141 mmol/L (ref 135–145)
Sodium: 139 mmol/L (ref 135–145)
TCO2: 26 mmol/L (ref 22–32)
TCO2: 26 mmol/L (ref 22–32)
pCO2, Ven: 49.6 mm[Hg] (ref 44–60)
pCO2, Ven: 41.7 mm[Hg] — ABNORMAL LOW (ref 44–60)
pH, Ven: 7.293 (ref 7.25–7.43)
pH, Ven: 7.373 (ref 7.25–7.43)
pO2, Ven: 56 mm[Hg] — ABNORMAL HIGH (ref 32–45)
pO2, Ven: 94 mm[Hg] — ABNORMAL HIGH (ref 32–45)

## 2023-01-30 LAB — APTT: aPTT: 35 s (ref 24–36)

## 2023-01-30 LAB — HEMOGLOBIN AND HEMATOCRIT, BLOOD
HCT: 34.7 % — ABNORMAL LOW (ref 39.0–52.0)
Hemoglobin: 11.6 g/dL — ABNORMAL LOW (ref 13.0–17.0)

## 2023-01-30 LAB — ABO/RH: ABO/RH(D): O POS

## 2023-01-30 LAB — MAGNESIUM: Magnesium: 2.7 mg/dL — ABNORMAL HIGH (ref 1.7–2.4)

## 2023-01-30 LAB — PROTIME-INR
INR: 1.3 — ABNORMAL HIGH (ref 0.8–1.2)
Prothrombin Time: 16.8 s — ABNORMAL HIGH (ref 11.4–15.2)

## 2023-01-30 LAB — PLATELET COUNT: Platelets: 175 10*3/uL (ref 150–400)

## 2023-01-30 SURGERY — REPAIR, MITRAL VALVE
Anesthesia: General | Site: Chest

## 2023-01-30 MED ORDER — ONDANSETRON HCL 4 MG/2ML IJ SOLN: 4.0000 mg | Freq: Four times a day (QID) | INTRAMUSCULAR | Status: DC | PRN

## 2023-01-30 MED ORDER — VANCOMYCIN HCL 1000 MG IV SOLR: INTRAVENOUS | Status: DC | PRN

## 2023-01-30 MED ORDER — PROPOFOL 10 MG/ML IV BOLUS
INTRAVENOUS | Status: AC
  Filled 2023-01-30: qty 20

## 2023-01-30 MED ORDER — LIDOCAINE 2% (20 MG/ML) 5 ML SYRINGE
INTRAMUSCULAR | Status: AC
  Filled 2023-01-30: qty 5

## 2023-01-30 MED ORDER — ~~LOC~~ CARDIAC SURGERY, PATIENT & FAMILY EDUCATION
Freq: Once | Status: DC
  Filled 2023-01-30: qty 1

## 2023-01-30 MED ORDER — PANTOPRAZOLE SODIUM 40 MG IV SOLR
40.0000 mg | Freq: Every day | INTRAVENOUS | Status: AC
  Administered 2023-01-30 – 2023-01-31 (×2): 40 mg via INTRAVENOUS
  Filled 2023-01-30 (×2): qty 10

## 2023-01-30 MED ORDER — CEFAZOLIN SODIUM-DEXTROSE 2-4 GM/100ML-% IV SOLN
2.0000 g | Freq: Three times a day (TID) | INTRAVENOUS | Status: AC
  Administered 2023-01-30 – 2023-02-01 (×6): 2 g via INTRAVENOUS
  Filled 2023-01-30 (×6): qty 100

## 2023-01-30 MED ORDER — CHLORHEXIDINE GLUCONATE 0.12 % MT SOLN: 15.0000 mL | Freq: Once | OROMUCOSAL | Status: DC

## 2023-01-30 MED ORDER — PROPOFOL 500 MG/50ML IV EMUL
INTRAVENOUS | Status: DC | PRN
  Administered 2023-01-30: 40 ug/kg/min via INTRAVENOUS

## 2023-01-30 MED ORDER — SODIUM CHLORIDE 0.9% FLUSH
10.0000 mL | Freq: Two times a day (BID) | INTRAVENOUS | Status: DC
  Administered 2023-01-30 – 2023-02-01 (×5): 10 mL via INTRAVENOUS

## 2023-01-30 MED ORDER — FENTANYL CITRATE (PF) 250 MCG/5ML IJ SOLN
INTRAMUSCULAR | Status: DC | PRN
  Administered 2023-01-30: 100 ug via INTRAVENOUS
  Administered 2023-01-30 (×2): 50 ug via INTRAVENOUS
  Administered 2023-01-30: 100 ug via INTRAVENOUS

## 2023-01-30 MED ORDER — ROCURONIUM BROMIDE 10 MG/ML (PF) SYRINGE
PREFILLED_SYRINGE | INTRAVENOUS | Status: AC
  Filled 2023-01-30: qty 20

## 2023-01-30 MED ORDER — METOCLOPRAMIDE HCL 5 MG/ML IJ SOLN
10.0000 mg | Freq: Four times a day (QID) | INTRAMUSCULAR | Status: AC
  Administered 2023-01-30 – 2023-02-01 (×6): 10 mg via INTRAVENOUS
  Filled 2023-01-30 (×6): qty 2

## 2023-01-30 MED ORDER — ALBUMIN HUMAN 5 % IV SOLN
250.0000 mL | INTRAVENOUS | Status: DC | PRN
  Administered 2023-01-30 – 2023-01-31 (×3): 12.5 g via INTRAVENOUS
  Filled 2023-01-30 (×2): qty 250

## 2023-01-30 MED ORDER — CHLORHEXIDINE GLUCONATE 0.12 % MT SOLN
15.0000 mL | Freq: Once | OROMUCOSAL | Status: AC
  Administered 2023-01-30: 15 mL via OROMUCOSAL
  Filled 2023-01-30: qty 15

## 2023-01-30 MED ORDER — DEXAMETHASONE SODIUM PHOSPHATE 10 MG/ML IJ SOLN
INTRAMUSCULAR | Status: AC
  Filled 2023-01-30: qty 1

## 2023-01-30 MED ORDER — BISACODYL 5 MG PO TBEC
10.0000 mg | DELAYED_RELEASE_TABLET | Freq: Every day | ORAL | Status: DC
  Administered 2023-01-31 – 2023-02-04 (×5): 10 mg via ORAL
  Filled 2023-01-30 (×5): qty 2

## 2023-01-30 MED ORDER — 0.9 % SODIUM CHLORIDE (POUR BTL) OPTIME
TOPICAL | Status: DC | PRN
  Administered 2023-01-30: 5000 mL

## 2023-01-30 MED ORDER — POTASSIUM CHLORIDE 10 MEQ/50ML IV SOLN: 10.0000 meq | INTRAVENOUS | Status: AC

## 2023-01-30 MED ORDER — MIDAZOLAM HCL (PF) 10 MG/2ML IJ SOLN
INTRAMUSCULAR | Status: AC
  Filled 2023-01-30: qty 2

## 2023-01-30 MED ORDER — METHADONE HCL IV SYRINGE 10 MG/ML FOR CABG
0.3000 mg/kg | Freq: Once | INTRAMUSCULAR | Status: AC
  Administered 2023-01-30: 23 mg via INTRAVENOUS
  Filled 2023-01-30: qty 2.3

## 2023-01-30 MED ORDER — SODIUM CHLORIDE 0.9% FLUSH
10.0000 mL | Freq: Two times a day (BID) | INTRAVENOUS | Status: DC
  Administered 2023-01-30 – 2023-02-01 (×6): 10 mL via INTRAVENOUS

## 2023-01-30 MED ORDER — ASPIRIN 81 MG PO CHEW: 324.0000 mg | CHEWABLE_TABLET | Freq: Every day | ORAL | Status: DC

## 2023-01-30 MED ORDER — ROCURONIUM BROMIDE 10 MG/ML (PF) SYRINGE
PREFILLED_SYRINGE | INTRAVENOUS | Status: DC | PRN
  Administered 2023-01-30 (×2): 50 mg via INTRAVENOUS
  Administered 2023-01-30: 100 mg via INTRAVENOUS

## 2023-01-30 MED ORDER — FENTANYL CITRATE (PF) 250 MCG/5ML IJ SOLN
INTRAMUSCULAR | Status: AC
  Filled 2023-01-30: qty 5

## 2023-01-30 MED ORDER — LACTATED RINGERS IV SOLN: INTRAVENOUS | Status: DC

## 2023-01-30 MED ORDER — MAGNESIUM SULFATE 4 GM/100ML IV SOLN
4.0000 g | Freq: Once | INTRAVENOUS | Status: AC
  Administered 2023-01-30: 4 g via INTRAVENOUS
  Filled 2023-01-30: qty 100

## 2023-01-30 MED ORDER — ALBUMIN HUMAN 5 % IV SOLN: INTRAVENOUS | Status: DC | PRN

## 2023-01-30 MED ORDER — VANCOMYCIN HCL IN DEXTROSE 1-5 GM/200ML-% IV SOLN
1000.0000 mg | Freq: Once | INTRAVENOUS | Status: AC
  Administered 2023-01-30: 1000 mg via INTRAVENOUS
  Filled 2023-01-30: qty 200

## 2023-01-30 MED ORDER — ONDANSETRON HCL 4 MG/2ML IJ SOLN
INTRAMUSCULAR | Status: DC | PRN
  Administered 2023-01-30: 4 mg via INTRAVENOUS

## 2023-01-30 MED ORDER — CHLORHEXIDINE GLUCONATE 4 % EX SOLN: 30.0000 mL | CUTANEOUS | Status: DC

## 2023-01-30 MED ORDER — DOCUSATE SODIUM 100 MG PO CAPS
200.0000 mg | ORAL_CAPSULE | Freq: Every day | ORAL | Status: DC
  Administered 2023-01-31 – 2023-02-04 (×5): 200 mg via ORAL
  Filled 2023-01-30 (×5): qty 2

## 2023-01-30 MED ORDER — PHENYLEPHRINE 80 MCG/ML (10ML) SYRINGE FOR IV PUSH (FOR BLOOD PRESSURE SUPPORT)
PREFILLED_SYRINGE | INTRAVENOUS | Status: AC
  Filled 2023-01-30: qty 10

## 2023-01-30 MED ORDER — PROPOFOL 1000 MG/100ML IV EMUL
INTRAVENOUS | Status: AC
  Filled 2023-01-30: qty 100

## 2023-01-30 MED ORDER — SODIUM CHLORIDE 0.9 % IV SOLN: INTRAVENOUS | Status: DC

## 2023-01-30 MED ORDER — PLASMA-LYTE A IV SOLN: INTRAVENOUS | Status: DC | PRN

## 2023-01-30 MED ORDER — GLYCOPYRROLATE PF 0.2 MG/ML IJ SOSY
PREFILLED_SYRINGE | INTRAMUSCULAR | Status: AC
  Filled 2023-01-30: qty 2

## 2023-01-30 MED ORDER — PROTAMINE SULFATE 10 MG/ML IV SOLN
INTRAVENOUS | Status: AC
  Filled 2023-01-30: qty 25

## 2023-01-30 MED ORDER — MIDAZOLAM HCL (PF) 5 MG/ML IJ SOLN
INTRAMUSCULAR | Status: DC | PRN
  Administered 2023-01-30: 2 mg via INTRAVENOUS

## 2023-01-30 MED ORDER — PANTOPRAZOLE SODIUM 40 MG PO TBEC
40.0000 mg | DELAYED_RELEASE_TABLET | Freq: Every day | ORAL | Status: DC
  Administered 2023-02-01 – 2023-02-05 (×5): 40 mg via ORAL
  Filled 2023-01-30 (×5): qty 1

## 2023-01-30 MED ORDER — PHENYLEPHRINE 80 MCG/ML (10ML) SYRINGE FOR IV PUSH (FOR BLOOD PRESSURE SUPPORT)
PREFILLED_SYRINGE | INTRAVENOUS | Status: DC | PRN
  Administered 2023-01-30: 80 ug via INTRAVENOUS
  Administered 2023-01-30: 40 ug via INTRAVENOUS
  Administered 2023-01-30: 80 ug via INTRAVENOUS

## 2023-01-30 MED ORDER — AMIODARONE HCL IN DEXTROSE 360-4.14 MG/200ML-% IV SOLN
30.0000 mg/h | INTRAVENOUS | Status: DC
  Administered 2023-01-30 – 2023-01-31 (×2): 30 mg/h via INTRAVENOUS
  Filled 2023-01-30 (×2): qty 200

## 2023-01-30 MED ORDER — AMIODARONE HCL IN DEXTROSE 360-4.14 MG/200ML-% IV SOLN
INTRAVENOUS | Status: DC | PRN
  Administered 2023-01-30: 60 mg/h via INTRAVENOUS

## 2023-01-30 MED ORDER — SODIUM BICARBONATE 8.4 % IV SOLN
50.0000 meq | Freq: Once | INTRAVENOUS | Status: AC
  Administered 2023-01-30: 50 meq via INTRAVENOUS

## 2023-01-30 MED ORDER — ONDANSETRON HCL 4 MG/2ML IJ SOLN
INTRAMUSCULAR | Status: AC
  Filled 2023-01-30: qty 2

## 2023-01-30 MED ORDER — DEXMEDETOMIDINE HCL IN NACL 400 MCG/100ML IV SOLN
0.0000 ug/kg/h | INTRAVENOUS | Status: DC
  Administered 2023-01-30: 0.5 ug/kg/h via INTRAVENOUS
  Filled 2023-01-30: qty 100

## 2023-01-30 MED ORDER — EPHEDRINE SULFATE-NACL 50-0.9 MG/10ML-% IV SOSY
PREFILLED_SYRINGE | INTRAVENOUS | Status: DC | PRN
  Administered 2023-01-30 (×2): 2.5 mg via INTRAVENOUS

## 2023-01-30 MED ORDER — TRAMADOL HCL 50 MG PO TABS: 50.0000 mg | ORAL_TABLET | ORAL | Status: DC | PRN

## 2023-01-30 MED ORDER — METOPROLOL TARTRATE 12.5 MG HALF TABLET: 12.5000 mg | ORAL_TABLET | Freq: Two times a day (BID) | ORAL | Status: DC

## 2023-01-30 MED ORDER — HEPARIN SODIUM (PORCINE) 1000 UNIT/ML IJ SOLN
INTRAMUSCULAR | Status: AC
  Filled 2023-01-30: qty 1

## 2023-01-30 MED ORDER — INSULIN REGULAR(HUMAN) IN NACL 100-0.9 UT/100ML-% IV SOLN: INTRAVENOUS | Status: DC

## 2023-01-30 MED ORDER — OXYCODONE HCL 5 MG PO TABS
5.0000 mg | ORAL_TABLET | ORAL | Status: DC | PRN
  Administered 2023-01-30 – 2023-01-31 (×4): 5 mg via ORAL
  Administered 2023-02-01: 10 mg via ORAL
  Administered 2023-02-01: 5 mg via ORAL
  Filled 2023-01-30: qty 1
  Filled 2023-01-30: qty 2
  Filled 2023-01-30 (×4): qty 1

## 2023-01-30 MED ORDER — METOPROLOL TARTRATE 12.5 MG HALF TABLET
12.5000 mg | ORAL_TABLET | Freq: Once | ORAL | Status: DC
  Filled 2023-01-30: qty 1

## 2023-01-30 MED ORDER — LACTATED RINGERS IV SOLN: INTRAVENOUS | Status: DC | PRN

## 2023-01-30 MED ORDER — AMIODARONE IV BOLUS ONLY 150 MG/100ML
INTRAVENOUS | Status: DC | PRN
  Administered 2023-01-30: 150 mg via INTRAVENOUS

## 2023-01-30 MED ORDER — CHLORHEXIDINE GLUCONATE CLOTH 2 % EX PADS
6.0000 | MEDICATED_PAD | Freq: Every day | CUTANEOUS | Status: DC
  Administered 2023-01-30 – 2023-02-05 (×7): 6 via TOPICAL

## 2023-01-30 MED ORDER — PLASMA-LYTE A IV SOLN
INTRAVENOUS | Status: DC
  Filled 2023-01-30: qty 5

## 2023-01-30 MED ORDER — MIDAZOLAM HCL 2 MG/2ML IJ SOLN
2.0000 mg | INTRAMUSCULAR | Status: DC | PRN
  Administered 2023-01-30: 2 mg via INTRAVENOUS
  Filled 2023-01-30: qty 2

## 2023-01-30 MED ORDER — SODIUM CHLORIDE 0.9 % IV SOLN: INTRAVENOUS | Status: DC | PRN

## 2023-01-30 MED ORDER — NITROGLYCERIN IN D5W 200-5 MCG/ML-% IV SOLN
0.0000 ug/min | INTRAVENOUS | Status: DC
Start: 2023-01-30 — End: 2023-02-01

## 2023-01-30 MED ORDER — ACETAMINOPHEN 160 MG/5ML PO SOLN: 1000.0000 mg | Freq: Four times a day (QID) | ORAL | Status: AC

## 2023-01-30 MED ORDER — SUGAMMADEX SODIUM 200 MG/2ML IV SOLN
INTRAVENOUS | Status: DC | PRN
  Administered 2023-01-30: 200 mg via INTRAVENOUS

## 2023-01-30 MED ORDER — EPINEPHRINE 1 MG/10ML IJ SOSY
PREFILLED_SYRINGE | INTRAMUSCULAR | Status: DC | PRN
  Administered 2023-01-30: 10 ug via INTRAVENOUS

## 2023-01-30 MED ORDER — PROTAMINE SULFATE 10 MG/ML IV SOLN
INTRAVENOUS | Status: AC
  Filled 2023-01-30: qty 15

## 2023-01-30 MED ORDER — METOPROLOL TARTRATE 5 MG/5ML IV SOLN: 2.5000 mg | INTRAVENOUS | Status: DC | PRN

## 2023-01-30 MED ORDER — PROPOFOL 10 MG/ML IV BOLUS
INTRAVENOUS | Status: DC | PRN
  Administered 2023-01-30 (×2): 40 mg via INTRAVENOUS
  Administered 2023-01-30: 70 mg via INTRAVENOUS

## 2023-01-30 MED ORDER — SODIUM CHLORIDE 0.9% FLUSH
3.0000 mL | INTRAVENOUS | Status: DC | PRN
Start: 2023-01-31 — End: 2023-02-02

## 2023-01-30 MED ORDER — ASPIRIN 325 MG PO TBEC
325.0000 mg | DELAYED_RELEASE_TABLET | Freq: Every day | ORAL | Status: DC
  Administered 2023-01-31 – 2023-02-04 (×5): 325 mg via ORAL
  Filled 2023-01-30 (×5): qty 1

## 2023-01-30 MED ORDER — SIMVASTATIN 20 MG PO TABS
20.0000 mg | ORAL_TABLET | Freq: Every day | ORAL | Status: DC
  Administered 2023-01-31 – 2023-02-04 (×5): 20 mg via ORAL
  Filled 2023-01-30 (×5): qty 1

## 2023-01-30 MED ORDER — ACETAMINOPHEN 500 MG PO TABS
1000.0000 mg | ORAL_TABLET | Freq: Four times a day (QID) | ORAL | Status: AC
  Administered 2023-01-30 – 2023-02-04 (×17): 1000 mg via ORAL
  Filled 2023-01-30 (×17): qty 2

## 2023-01-30 MED ORDER — MORPHINE SULFATE (PF) 2 MG/ML IV SOLN
1.0000 mg | INTRAVENOUS | Status: DC | PRN
  Administered 2023-01-30: 1 mg via INTRAVENOUS
  Administered 2023-02-01 (×2): 4 mg via INTRAVENOUS
  Filled 2023-01-30: qty 2
  Filled 2023-01-30: qty 1
  Filled 2023-01-30 (×2): qty 2

## 2023-01-30 MED ORDER — GLYCOPYRROLATE 0.2 MG/ML IJ SOLN
INTRAMUSCULAR | Status: DC | PRN
  Administered 2023-01-30: .2 mg via INTRAVENOUS

## 2023-01-30 MED ORDER — DEXTROSE 50 % IV SOLN: 0.0000 mL | INTRAVENOUS | Status: DC | PRN

## 2023-01-30 MED ORDER — LIDOCAINE HCL (CARDIAC) PF 100 MG/5ML IV SOSY
PREFILLED_SYRINGE | INTRAVENOUS | Status: DC | PRN
  Administered 2023-01-30 (×2): 100 mg via INTRAVENOUS

## 2023-01-30 MED ORDER — ORAL CARE MOUTH RINSE: 15.0000 mL | Freq: Once | OROMUCOSAL | Status: AC

## 2023-01-30 MED ORDER — CHLORHEXIDINE GLUCONATE 0.12 % MT SOLN
15.0000 mL | OROMUCOSAL | Status: AC
  Administered 2023-01-30: 15 mL via OROMUCOSAL
  Filled 2023-01-30: qty 15

## 2023-01-30 MED ORDER — ASPIRIN 81 MG PO CHEW
324.0000 mg | CHEWABLE_TABLET | Freq: Once | ORAL | Status: AC
Start: 2023-01-30 — End: 2023-01-30
  Administered 2023-01-30: 324 mg via ORAL
  Filled 2023-01-30: qty 4

## 2023-01-30 MED ORDER — ACETAMINOPHEN 160 MG/5ML PO SOLN
650.0000 mg | Freq: Once | ORAL | Status: DC
  Filled 2023-01-30: qty 20.3

## 2023-01-30 MED ORDER — HEPARIN SODIUM (PORCINE) 1000 UNIT/ML IJ SOLN
INTRAMUSCULAR | Status: DC | PRN
  Administered 2023-01-30: 37000 [IU] via INTRAVENOUS

## 2023-01-30 MED ORDER — EPHEDRINE 5 MG/ML INJ
INTRAVENOUS | Status: AC
  Filled 2023-01-30: qty 5

## 2023-01-30 MED ORDER — TAMSULOSIN HCL 0.4 MG PO CAPS
0.4000 mg | ORAL_CAPSULE | Freq: Every evening | ORAL | Status: AC
Start: 2023-01-31 — End: ?
  Administered 2023-01-31 – 2023-02-04 (×5): 0.4 mg via ORAL
  Filled 2023-01-30 (×5): qty 1

## 2023-01-30 MED ORDER — EPHEDRINE SULFATE (PRESSORS) 50 MG/ML IJ SOLN: INTRAMUSCULAR | Status: DC | PRN

## 2023-01-30 MED ORDER — PHENYLEPHRINE HCL-NACL 20-0.9 MG/250ML-% IV SOLN
0.0000 ug/min | INTRAVENOUS | Status: DC
Start: 2023-01-30 — End: 2023-02-01
  Administered 2023-01-30 – 2023-01-31 (×2): 70 ug/min via INTRAVENOUS
  Administered 2023-01-31: 50 ug/min via INTRAVENOUS
  Filled 2023-01-30 (×3): qty 250

## 2023-01-30 MED ORDER — SERTRALINE HCL 50 MG PO TABS
50.0000 mg | ORAL_TABLET | Freq: Every day | ORAL | Status: DC
Start: 2023-01-31 — End: 2023-02-05
  Administered 2023-01-31 – 2023-02-05 (×6): 50 mg via ORAL
  Filled 2023-01-30 (×6): qty 1

## 2023-01-30 MED ORDER — BISACODYL 10 MG RE SUPP: 10.0000 mg | Freq: Every day | RECTAL | Status: DC

## 2023-01-30 MED ORDER — SODIUM CHLORIDE 0.9% FLUSH
3.0000 mL | Freq: Two times a day (BID) | INTRAVENOUS | Status: DC
  Administered 2023-01-31 – 2023-02-01 (×3): 3 mL via INTRAVENOUS

## 2023-01-30 MED ORDER — METOPROLOL TARTRATE 25 MG/10 ML ORAL SUSPENSION: 12.5000 mg | Freq: Two times a day (BID) | ORAL | Status: DC

## 2023-01-30 MED ORDER — SODIUM CHLORIDE 0.45 % IV SOLN: INTRAVENOUS | Status: DC | PRN

## 2023-01-30 MED ORDER — DEXAMETHASONE SODIUM PHOSPHATE 4 MG/ML IJ SOLN
INTRAMUSCULAR | Status: DC | PRN
  Administered 2023-01-30: 10 mg via INTRAVENOUS

## 2023-01-30 MED ORDER — PROTAMINE SULFATE 10 MG/ML IV SOLN
INTRAVENOUS | Status: DC | PRN
  Administered 2023-01-30: 350 mg via INTRAVENOUS

## 2023-01-30 MED FILL — Potassium Chloride Inj 2 mEq/ML: INTRAVENOUS | Qty: 20 | Status: AC

## 2023-01-30 MED FILL — Lidocaine HCl Local Preservative Free (PF) Inj 2%: INTRAMUSCULAR | Qty: 14 | Status: AC

## 2023-01-30 MED FILL — Heparin Sodium (Porcine) Inj 1000 Unit/ML: Qty: 1000 | Status: AC

## 2023-01-30 SURGICAL SUPPLY — 70 items
ADAPTER CARDIO PERF ANTE/RETRO (ADAPTER) IMPLANT
ANTIFOG SOL W/FOAM PAD STRL (MISCELLANEOUS) ×2
BAG DECANTER FOR FLEXI CONT (MISCELLANEOUS) ×3 IMPLANT
BLADE CLIPPER SURG (BLADE) ×3 IMPLANT
BLADE STERNUM SYSTEM 6 (BLADE) ×3 IMPLANT
CANISTER SUCT 3000ML PPV (MISCELLANEOUS) ×3 IMPLANT
CANN PRFSN 3/8XCNCT ST RT ANG (MISCELLANEOUS) ×2
CANNULA NON VENT 20FR 12 (CANNULA) ×3 IMPLANT
CANNULA NON VENT 22FR 12 (CANNULA) ×3 IMPLANT
CANNULA PRFSN 3/8XCNCT RT ANG (MISCELLANEOUS) IMPLANT
CANNULA SUMP PERICARDIAL (CANNULA) ×3 IMPLANT
CANNULA VRC MALB SNGL STG 36FR (MISCELLANEOUS) IMPLANT
CATH HEART VENT LEFT (CATHETERS) ×3 IMPLANT
CATH RETROPLEGIA CORONARY 14FR (CATHETERS) IMPLANT
CATH ROBINSON RED A/P 18FR (CATHETERS) ×9 IMPLANT
CATH THOR STR 32F SOFT 20 RADI (CATHETERS) ×3 IMPLANT
CATH THORACIC 28FR RT ANG (CATHETERS) ×3 IMPLANT
CLAMP SUTURE YELLOW 5 PAIRS (MISCELLANEOUS) ×3 IMPLANT
CLIP TI MEDIUM 24 (CLIP) IMPLANT
CLIP TI WIDE RED SMALL 24 (CLIP) IMPLANT
DRAIN CONNECTOR BLAKE 1:1 (MISCELLANEOUS) IMPLANT
DRAPE CV SPLIT W-CLR ANES SCRN (DRAPES) ×3 IMPLANT
DRAPE INCISE IOBAN 66X45 STRL (DRAPES) ×3 IMPLANT
DRAPE PERI GROIN 82X75IN TIB (DRAPES) ×3 IMPLANT
DRSG AQUACEL AG ADV 3.5X10 (GAUZE/BANDAGES/DRESSINGS) ×3 IMPLANT
ELECT CAUTERY BLADE 6.4 (BLADE) ×3 IMPLANT
ELECT REM PT RETURN 9FT ADLT (ELECTROSURGICAL) ×4
ELECTRODE REM PT RTRN 9FT ADLT (ELECTROSURGICAL) ×6 IMPLANT
FELT TEFLON 1X6 (MISCELLANEOUS) ×3 IMPLANT
GAUZE SPONGE 4X4 12PLY STRL (GAUZE/BANDAGES/DRESSINGS) ×3 IMPLANT
GOWN STRL REUS W/ TWL LRG LVL3 (GOWN DISPOSABLE) ×18 IMPLANT
HEMOSTAT SURGICEL 2X14 (HEMOSTASIS) IMPLANT
INSERT FOGARTY XLG (MISCELLANEOUS) ×3 IMPLANT
KIT BASIN OR (CUSTOM PROCEDURE TRAY) ×3 IMPLANT
KIT SUT CK MINI COMBO 4X17 (Prosthesis & Implant Heart) IMPLANT
KIT TURNOVER KIT B (KITS) ×3 IMPLANT
LINE VENT (MISCELLANEOUS) IMPLANT
NS IRRIG 1000ML POUR BTL (IV SOLUTION) ×18 IMPLANT
ORGANIZER SUTURE GABBAY-FRATER (MISCELLANEOUS) ×3 IMPLANT
PACK E OPEN HEART (SUTURE) ×3 IMPLANT
PACK OPEN HEART (CUSTOM PROCEDURE TRAY) ×3 IMPLANT
PAD ARMBOARD 7.5X6 YLW CONV (MISCELLANEOUS) ×6 IMPLANT
PAD ELECT DEFIB RADIOL ZOLL (MISCELLANEOUS) ×3 IMPLANT
PENCIL BUTTON HOLSTER BLD 10FT (ELECTRODE) ×3 IMPLANT
POSITIONER HEAD DONUT 9IN (MISCELLANEOUS) ×3 IMPLANT
RING ANNULOPLASTY SIMULUS 30 (Prosthesis & Implant Heart) IMPLANT
SET MPS 3-ND DEL (MISCELLANEOUS) IMPLANT
SOLUTION ANTFG W/FOAM PAD STRL (MISCELLANEOUS) ×3 IMPLANT
SUT ETHIBOND 2 0 SH 36X2 (SUTURE) IMPLANT
SUT ETHIBOND 3 0 SH 1 (SUTURE) IMPLANT
SUT MNCRL AB 4-0 PS2 18 (SUTURE) ×6 IMPLANT
SUT PROLENE 4 0 SH DA (SUTURE) ×9 IMPLANT
SUT PROLENE 4-0 RB1 .5 CRCL 36 (SUTURE) ×6 IMPLANT
SUT PROLENE 5 0 RB 2 (SUTURE) IMPLANT
SUT STEEL 6MS V (SUTURE) ×3 IMPLANT
SUT STEEL SZ 6 DBL 3X14 BALL (SUTURE) ×6 IMPLANT
SUT VIC AB 0 CTX36XBRD ANTBCTR (SUTURE) ×6 IMPLANT
SUT VIC AB 2-0 CT1 TAPERPNT 27 (SUTURE) ×6 IMPLANT
SYSTEM SAHARA CHEST DRAIN ATS (WOUND CARE) ×3 IMPLANT
TAG SUTURE CLAMP YLW 5PR (MISCELLANEOUS) ×2
TAPE CLOTH SURG 4X10 WHT LF (GAUZE/BANDAGES/DRESSINGS) IMPLANT
TAPE PAPER 2X10 WHT MICROPORE (GAUZE/BANDAGES/DRESSINGS) IMPLANT
TOWEL GREEN STERILE (TOWEL DISPOSABLE) ×3 IMPLANT
TOWEL GREEN STERILE FF (TOWEL DISPOSABLE) ×3 IMPLANT
TRAY FOLEY SLVR 16FR TEMP STAT (SET/KITS/TRAYS/PACK) ×3 IMPLANT
TUBE CONNECTING 12X1/4 (SUCTIONS) IMPLANT
UNDERPAD 30X36 HEAVY ABSORB (UNDERPADS AND DIAPERS) ×3 IMPLANT
VENT LEFT HEART 12002 (CATHETERS) ×2
VRC MALLEABLE SINGLE STG 36FR (MISCELLANEOUS) ×2
WATER STERILE IRR 1000ML POUR (IV SOLUTION) ×6 IMPLANT

## 2023-01-30 NOTE — Anesthesia Procedure Notes (Signed)
Procedure Name: Intubation Date/Time: 01/30/2023 8:45 AM  Performed by: Little Ishikawa, CRNAPre-anesthesia Checklist: Patient identified, Emergency Drugs available, Suction available, Timeout performed and Patient being monitored Patient Re-evaluated:Patient Re-evaluated prior to induction Oxygen Delivery Method: Circle system utilized Preoxygenation: Pre-oxygenation with 100% oxygen Induction Type: IV induction Ventilation: Mask ventilation without difficulty and Oral airway inserted - appropriate to patient size Laryngoscope Size: Mac and 4 Grade View: Grade I Tube type: Oral Tube size: 8.0 mm Number of attempts: 1 Airway Equipment and Method: Stylet Placement Confirmation: ETT inserted through vocal cords under direct vision, positive ETCO2, CO2 detector and breath sounds checked- equal and bilateral Secured at: 22 cm Tube secured with: Tape Dental Injury: Teeth and Oropharynx as per pre-operative assessment

## 2023-01-30 NOTE — Transfer of Care (Addendum)
Immediate Anesthesia Transfer of Care Note  Patient: Ahijah Over Desmith  Procedure(s) Performed: MITRAL VALVE REPAIR (MVR) UTILIZING SIMULUS SEMI-RIGID ANNULOPLASTY BAND SIZE (Chest) TRANSESOPHAGEAL ECHOCARDIOGRAM (TEE)  Patient Location: ICU  Anesthesia Type:General  Level of Consciousness: sedated and Patient remains intubated per anesthesia plan  Airway & Oxygen Therapy: Patient remains intubated per anesthesia plan and Patient placed on Ventilator (see vital sign flow sheet for setting)  Post-op Assessment: Report given to RN and Post -op Vital signs reviewed and stable  Post vital signs: Reviewed and stable  Last Vitals:  Vitals Value Taken Time  BP 117/79 01/30/23   1255  Temp 36.4 C 01/30/23 1258  Pulse 80 01/30/23 1258  Resp 12 01/30/23 1258  SpO2 95 % 01/30/23 1258  Vitals shown include unfiled device data.  Last Pain:  Vitals:   01/30/23 0724  TempSrc:   PainSc: 0-No pain         Complications: No notable events documented.

## 2023-01-30 NOTE — Progress Notes (Addendum)
Health care POA and living will requested from wife Michael [name]. Wife confirmed location of documents with patient and states she will bring documents in.

## 2023-01-30 NOTE — Anesthesia Preprocedure Evaluation (Signed)
Anesthesia Evaluation  Patient identified by MRN, date of birth, ID band Patient awake    Reviewed: Allergy & Precautions, NPO status , Patient's Chart, lab work & pertinent test results, reviewed documented beta blocker date and time   History of Anesthesia Complications Negative for: history of anesthetic complications  Airway Mallampati: III  TM Distance: >3 FB Neck ROM: Limited    Dental no notable dental hx.    Pulmonary sleep apnea , neg COPD, former smoker   breath sounds clear to auscultation       Cardiovascular hypertension, + CAD (mild)  (-) CABG and (-) Peripheral Vascular Disease + Valvular Problems/Murmurs MR  Rhythm:Regular Rate:Normal + Systolic murmurs    Neuro/Psych Seizures -, Well Controlled,   Anxiety        GI/Hepatic hiatal hernia,GERD  ,,(+) neg Cirrhosis        Endo/Other    Renal/GU Renal disease     Musculoskeletal   Abdominal   Peds  Hematology   Anesthesia Other Findings   Reproductive/Obstetrics                              Anesthesia Physical Anesthesia Plan  ASA: 4  Anesthesia Plan: General   Post-op Pain Management:    Induction: Intravenous  PONV Risk Score and Plan: 2 and Ondansetron  Airway Management Planned: Oral ETT  Additional Equipment: Arterial line, CVP, PA Cath, 3D TEE and Ultrasound Guidance Line Placement  Intra-op Plan:   Post-operative Plan: Post-operative intubation/ventilation  Informed Consent: I have reviewed the patients History and Physical, chart, labs and discussed the procedure including the risks, benefits and alternatives for the proposed anesthesia with the patient or authorized representative who has indicated his/her understanding and acceptance.     Dental advisory given  Plan Discussed with: CRNA  Anesthesia Plan Comments:          Anesthesia Quick Evaluation

## 2023-01-30 NOTE — Hospital Course (Addendum)
History of Present Illness:     Mr. Michael [name] is a very pleasant 72 yo male who has had a murmur all his life. He started having echos in his 16s and has been followed closely for MV prolapse. The patient also has bradycardia and is followed by EP and was told at some point he may need PPM. He was recently seen by cardiology and was found on TTE to now have more severe MR. He was relatively asymptomatic but on the last clinic visit started to complain of DOE. He has no lightheadedness nor CP or lower extremity edema. The patient was felt to best have better evaluation with TEE and was found to have EF of 55-60% and severe MR from P1 flail. He went on to have L and R heart cath with no CAD and minimal Pulm artery pressures.  Dr. Leafy Ro reviewed the patient's diagnostic studies and felt due to his increase in MR and now symptoms to best be served with MV repair. He reviewed the treatment options as well as the risks and benefits of surgery. Mr. Michael [name] was agreeable to proceed with surgery.  Hospital Course: Mr. Michael [name] presented to [facility] and was brought to the operating room on 01/30/23. He underwent mitral valve repair with triangular resection of P1 and MV annuloplasty with a 30mm Medtronic Simulus band. He tolerated the procedure well and was transferred to the SICU in stable condition.  The patient was extubated the evening of surgery.  He was weaned off neo-synephrine as hemodynamics allowed.  He required A. Pacing and his Amiodarone initiated for VT was discontinued.  EP consult was also obtained.  They followed patient and did not feel he would require pacemaker placement.  He was started on lasix to help facilitate diuresis.  The patient's rhythm became junctional in the 60s.  He was not started on any nodal blocking agents.  He was weaned off oxygen as tolerated.  His chest tubes were removed without difficulty.  He developed Atrial Fibrillation with RVR.  He was started on IV amiodarone  bolus and drip therapy.  He developed a mild elevation in his creatinine level due to diuretics, these were held.  He converted to NSR and was transitioned to oral Amiodarone.  His creatinine level improved and low dose oral Lasix was resumed. He was a paced but this was not capturing appropriately. EP continued to follow him and felt his epicardial pacing wires could be removed on 12/23 since his junctional bradycardia had resolved. He was started on Eliquis on 12/24. He remained in sinus bradycardia. He was weaned off oxygen and was ambulating well on RA. His creatinine bumped to 1.56 likely due to diuresis but that seems to be close to his baseline. He developed loose stools, stool softeners were discontinued. This improved with time. His incisions were healing well without sign of infection. He was felt stable for discharge home.

## 2023-01-30 NOTE — Anesthesia Procedure Notes (Signed)
Central Venous Catheter Insertion Performed by: Mariann Barter, MD, anesthesiologist Start/End12/18/2024 8:00 AM, 01/30/2023 8:10 AM Patient location: Pre-op. Preanesthetic checklist: patient identified, IV checked, site marked, risks and benefits discussed, surgical consent, monitors and equipment checked, pre-op evaluation, timeout performed and anesthesia consent Position: Trendelenburg Lidocaine 1% used for infiltration and patient sedated Hand hygiene performed  and maximum sterile barriers used  Catheter size: 8.5 Fr Total catheter length 80. MAC introducer Swan type:thermodilution PA Cath depth:50 Procedure performed using ultrasound guided technique. Ultrasound Notes:anatomy identified, needle tip was noted to be adjacent to the nerve/plexus identified, no ultrasound evidence of intravascular and/or intraneural injection and image(s) printed for medical record Attempts: 1 Following insertion, line sutured and dressing applied. Post procedure assessment: blood return through all ports, free fluid flow and no air  Patient tolerated the procedure well with no immediate complications.

## 2023-01-30 NOTE — Op Note (Signed)
CARDIOVASCULAR SURGERY OPERATIVE NOTE  01/30/2023 Michael [name] [id number]  Surgeon:  Ashley Akin, MD  First Assistant: Jillyn Hidden [facility]                               An experienced assistant was required given the complexity of this surgery and the standard of surgical care. The assistant was needed for exposure, dissection, suctioning, retraction of delicate tissues and sutures, instrument exchange and for overall help during this procedure.     Preoperative Diagnosis:  Severe Mitral Regurgitation secondary to flail P1 segment                                              Patent foreman ovale    Postoperative Diagnosis:  Same   Procedure: Complex mitral valve repair with triangular resection of P1 with placement of a 30mm Simulus semi ridig band Closure of PFO  Anesthesia:  General Endotracheal   Clinical History/Surgical Indication:  Pt is a 72 yo male with NYHA class I symptoms of severe MR from posterior P1 flail and preserved LV function with no CAD, PHTN, nor afib. Pt with bradycardia. We discussed the indications for surgery and the risks, goals and recovery from MV repair. I believe he has better than a 95% of repair however comissural P1 location is somewhat more difficult.   Findings: Normal LV function with flail P1 and severe MR. After repair there was no regurgitation and mean gradient of 2-7mmHg with now EF 45-50% and closed PFO  Preparation:  The patient was seen in the preoperative holding area and the correct patient, correct operation were confirmed with the patient after reviewing the medical record and catheterization. The consent was signed by me. Preoperative antibiotics were given. A pulmonary arterial line and radial arterial line were placed by the anesthesia team. The patient was taken back to the operating room and positioned supine on the operating room table. After being placed under general endotracheal anesthesia by the anesthesia team a foley  catheter was placed. The neck, chest, abdomen, and both legs were prepped with betadine soap and solution and draped in the usual sterile manner. A surgical time-out was taken and the correct patient and operative procedure were confirmed with the nursing and anesthesia staff.  Operation: A median sternotomy incision was then created and sternal above the sternal saw.  Simultaneously heparin was delivered and the pericardial well developed.  Aorta was cannulated with a 22 [place] Starns aortic cannula and a 36 French straight cannula was directed to the IVC from the atrial appendage and an additional 28 French right angle cannulas placed in the superior vena cava.  With adequate confirmation of anticoagulation cardiopulmonary bypass was instituted.  An antegrade cardioplegia catheter was placed in the ascending aorta. Aortic cross-clamp was placed and cold [place] blood cardioplegia was delivered for proximately 1500 cc.  A reanimation dose was delivered just prior to cross-clamp removal. Left atrium was opened in the intra-atrial groove and excellent exposure of the mitral valve was obtained.  The P1 segment was obviously flail and 3-0 Ethibond sutures were placed in the posterior annulus in anticipation of the band.  At this time the P1 segment was resected in a triangular fashion and the leaflets reapproximated with a running 5-0 Prolene suture.  The valve was sized  to a 30 mm stimulus band which was secured with the cor knot system. Upon saline load testing there was excellent coaptation of the valve and no regurgitation.  The left atrium was then closed over a ventricular sump with running 4-0 Prolene sutures. With the patient in headdown position aortic cross-clamp was removed and atrial and ventricular pacing wires were brought out through inferior stab wounds and secured.  Patient had some arrhythmias which were most likely secondary to some ventricular air and once this cleared the patient was  weaned from cardiopulmonary bypass on inotropic support.  With adequate valve function protamine was delivered and the patient was decannulated and sites oversewn were necessary.  Chest tubes were brought inferior stab was and secured and with adequate hemostasis the sternum was reapproximated with interrupted stainless steel wires and the presternal subcutaneous tissue and skin were closed multilayer's absorbable suture.  Sterile dressings were applied.

## 2023-01-30 NOTE — Interval H&P Note (Signed)
History and Physical Interval Note:  01/30/2023 6:51 AM  Michael [name]  has presented today for surgery, with the diagnosis of SEVERE MR.  The various methods of treatment have been discussed with the patient and family. After consideration of risks, benefits and other options for treatment, the patient has consented to  Procedure(s): MITRAL VALVE REPAIR (MVR) (N/A) TRANSESOPHAGEAL ECHOCARDIOGRAM (TEE) (N/A) as a surgical intervention.  The patient's history has been reviewed, patient examined, no change in status, stable for surgery.  I have reviewed the patient's chart and labs.  Questions were answered to the patient's satisfaction.     Eugenio Hoes

## 2023-01-30 NOTE — Brief Op Note (Signed)
01/30/2023  12:07 PM  PATIENT:  Michael [name]  72 y.o. male  PRE-OPERATIVE DIAGNOSIS:  SEVERE MITRAL REGURGITATION  POST-OPERATIVE DIAGNOSIS:  SEVERE MITRAL REGURGITATION  PROCEDURE:   MITRAL VALVE REPAIR   TRANSESOPHAGEAL ECHOCARDIOGRAM   SURGEON:  Eugenio Hoes, MD - Primary  PHYSICIAN ASSISTANT: Renelda Kilian  ASSISTANTS: Tanda Rockers, RN, RN First Assistant         Williams, Sadie, Scrub Person   ANESTHESIA:   general  EBL:   BLOOD ADMINISTERED:none  DRAINS:  Mediastinal drains    LOCAL MEDICATIONS USED:  NONE  SPECIMEN:  Segment of resected posterior mitral valve leaflet  DISPOSITION OF SPECIMEN:  PATHOLOGY  COUNTS:  Correct  DICTATION: .Dragon Dictation  PLAN OF CARE: Admit to inpatient   PATIENT DISPOSITION:  ICU - intubated and hemodynamically stable.   Delay start of Pharmacological VTE agent (>24hrs) due to surgical blood loss or risk of bleeding: yes

## 2023-01-30 NOTE — Plan of Care (Signed)
  Problem: Education: Goal: Knowledge of General Education information will improve Description: Including pain rating scale, medication(s)/side effects and non-pharmacologic comfort measures Outcome: Progressing   Problem: Health Behavior/Discharge Planning: Goal: Ability to manage health-related needs will improve Outcome: Progressing   Problem: Clinical Measurements: Goal: Ability to maintain clinical measurements within normal limits will improve Outcome: Progressing Goal: Will remain free from infection Outcome: Progressing Goal: Diagnostic test results will improve Outcome: Progressing Goal: Respiratory complications will improve Outcome: Progressing Goal: Cardiovascular complication will be avoided Outcome: Progressing   Problem: Activity: Goal: Risk for activity intolerance will decrease Outcome: Progressing   Problem: Nutrition: Goal: Adequate nutrition will be maintained Outcome: Progressing   Problem: Coping: Goal: Level of anxiety will decrease Outcome: Progressing   Problem: Elimination: Goal: Will not experience complications related to bowel motility Outcome: Progressing Goal: Will not experience complications related to urinary retention Outcome: Progressing   Problem: Pain Management: Goal: General experience of comfort will improve Outcome: Progressing   Problem: Safety: Goal: Ability to remain free from injury will improve Outcome: Progressing   Problem: Skin Integrity: Goal: Risk for impaired skin integrity will decrease Outcome: Progressing   Problem: Education: Goal: Will demonstrate proper wound care and an understanding of methods to prevent future damage Outcome: Progressing Goal: Knowledge of disease or condition will improve Outcome: Progressing Goal: Knowledge of the prescribed therapeutic regimen will improve Outcome: Progressing Goal: Individualized Educational Video(s) Outcome: Progressing   Problem: Activity: Goal: Risk for  activity intolerance will decrease Outcome: Progressing   Problem: Cardiac: Goal: Will achieve and/or maintain hemodynamic stability Outcome: Progressing   Problem: Clinical Measurements: Goal: Postoperative complications will be avoided or minimized Outcome: Progressing   Problem: Respiratory: Goal: Respiratory status will improve Outcome: Progressing   Problem: Skin Integrity: Goal: Wound healing without signs and symptoms of infection Outcome: Progressing Goal: Risk for impaired skin integrity will decrease Outcome: Progressing   Problem: Education: Goal: Will demonstrate proper wound care and an understanding of methods to prevent future damage Outcome: Progressing Goal: Knowledge of disease or condition will improve Outcome: Progressing Goal: Knowledge of the prescribed therapeutic regimen will improve Outcome: Progressing Goal: Individualized Educational Video(s) Outcome: Progressing   Problem: Activity: Goal: Risk for activity intolerance will decrease Outcome: Progressing   Problem: Cardiac: Goal: Will achieve and/or maintain hemodynamic stability Outcome: Progressing   Problem: Clinical Measurements: Goal: Postoperative complications will be avoided or minimized Outcome: Progressing

## 2023-01-30 NOTE — Discharge Instructions (Signed)
Discharge Instructions:  1. You may shower, please wash incisions daily with soap and water and keep dry.  If you wish to cover wounds with dressing you may do so but please keep clean and change daily.  No tub baths or swimming until incisions have completely healed.  If your incisions become red or develop any drainage please call our office at [phone]  2. No Driving until cleared by Dr. Weldner's office and you are no longer using narcotic pain medications  3. Monitor your weight daily.. Please use the same scale and weigh at same time... If you gain 5-10 lbs in 48 hours with associated lower extremity swelling, please contact our office at [phone]  4. Fever of 101.5 for at least 24 hours with no source, please contact our office at [phone]  5. Activity- up as tolerated, please walk at least 3 times per day.  Avoid strenuous activity, no lifting, pushing, or pulling with your arms over 8-10 lbs for a minimum of 6 weeks  6. If any questions or concerns arise, please do not hesitate to contact our office at [phone]  

## 2023-01-30 NOTE — Consult Note (Signed)
NAME:  Michael [name], MRN:  [id number], DOB:  1950-12-21, LOS: 0 ADMISSION DATE:  01/30/2023, CONSULTATION DATE: 01/30/2023 REFERRING MD: Dr. Leafy Ro, CHIEF COMPLAINT: Status post mitral valve repair  History of Present Illness:  72 year old male with BPH, hyperlipidemia and lifelong murmur was evaluated, noted to have mitral valve prolapse, on workup noted to have severe mitral valve regurgitation.  EF was 55 to 60%, today patient underwent complex mitral valve repair and closure of PFO Patient is intubated was transferred to ICU, PCCM consulted for help evaluation medical management  Pertinent  Medical History   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 rate 16, height 5\' 11"  (1.803 m), weight 104.3 kg, SpO2 94%. PAP: (9-335)/(-20-326) 25/7  Vent  Mode: SIMV;PRVC;PSV FiO2 (%):  [50 %] 50 % Set Rate:  [12 bmp] 12 bmp Vt Set:  [600 mL] 600 mL PEEP:  [5 cmH20] 5 cmH20 Pressure Support:  [10 cmH20] 10 cmH20   Intake/Output Summary (Last 24 hours) at 01/30/2023 1313 Last data filed at 01/30/2023 1217 Gross per 24 hour  Intake 2980 ml  Output 1855 ml  Net 1125 ml   Filed Weights   01/30/23 0700  Weight: 104.3 kg    Examination: General: Crtitically ill-appearing elderly male, orally intubated HEENT: [place]/AT, eyes anicteric.  ETT and OGT in place Neuro: Sedated, not following commands.  Eyes are closed.  Pupils 3 mm bilateral reactive to light Chest: Sternotomy incision is well-dressed, coarse breath sounds, no wheezes or rhonchi.  Mediastinal and chest tube in place Heart: Regular rate and rhythm, no murmurs or gallops Abdomen: Soft, nondistended, bowel sounds present Skin: No rash  Labs and images reviewed  Resolved Hospital Problem list     Assessment & Plan:  Severe mitral regurgitation due to MVP s/p complex mitral valve repair Patent foramen ovale status post closure Continue aspirin and statin Continue albuterol Chest tube management TCTS Continue to titrate Precedex with RASS goal 0/-1 Continue pain control with tramadol, oxycodone and morphine Closely monitor chest tube output TEE during surgery and postop showing EF 35%, probably cardiac stunning after coming off of pump  Acute respiratory insufficiency, postop  Continue on protective ventilation VAP prevention bundle in place Rapid weaning protocol ordered is in place  Hyperlipidemia Continue simvastatin  Obesity Diet and exercise counseling as appropriate  Best Practice (right click and "Reselect all SmartList Selections" daily)   Diet/type: NPO DVT prophylaxis: SCD GI prophylaxis: PPI Lines: Central line, Arterial Line, and yes and it is still needed Foley:  Yes, and it is still needed Code Status:  full code Last date of multidisciplinary  goals of care discussion [Per primary team]   Labs   CBC: Recent Labs  Lab 01/28/23 1028 01/30/23 0859 01/30/23 1103 01/30/23 1107 01/30/23 1147 01/30/23 1150 01/30/23 1302  WBC 9.0  --   --   --   --   --   --   HGB 15.6   < > 11.9* 11.6* 11.6* 11.9* 14.3  HCT 46.7   < > 35.0* 34.7* 34.0* 35.0* 42.0  MCV 93.4  --   --   --   --   --   --   PLT 242  --   --  175  --   --   --    < > = values in this interval not displayed.    Basic Metabolic Panel: Recent Labs  Lab 01/28/23 1028 01/30/23 0859 01/30/23 0902 01/30/23 0947 01/30/23 1003 01/30/23 1033 01/30/23 1103 01/30/23 1147 01/30/23 1150 01/30/23 1302  NA 142   < > 141 141   < > 141 140 140 140 140  K 3.9   < > 4.0 4.1   < > 4.6 4.8 4.5 4.6 4.4  CL 109  --  105 105  --  104  --   --  105  --   CO2 25  --   --   --   --   --   --   --   --   --   GLUCOSE 94  --  127* 103*  --  120*  --   --  140*  --   BUN 14  --  14 14  --  14  --   --  14  --   CREATININE 1.48*  --  1.30* 1.20  --  1.10  --   --  1.10  --   CALCIUM 9.2  --   --   --   --   --   --   --   --   --    < > = values in this interval not displayed.   GFR: Estimated Creatinine Clearance: 73.5 mL/min (by C-G formula based on SCr of 1.1 mg/dL). Recent Labs  Lab 01/28/23 1028  WBC 9.0    Liver Function Tests: Recent Labs  Lab 01/28/23 1028  AST 25  ALT 22  ALKPHOS 87  BILITOT 0.8  PROT 6.6  ALBUMIN 3.7   No results for input(s): "LIPASE", "AMYLASE" in the last 168 hours. No results for input(s): "AMMONIA" in the last 168 hours.  ABG    Component Value Date/Time   PHART 7.279 (L) 01/30/2023 1302   PCO2ART 53.0 (H) 01/30/2023 1302   PO2ART 75 (L) 01/30/2023 1302   HCO3 25.0 01/30/2023 1302   TCO2 27 01/30/2023 1302   ACIDBASEDEF 3.0 (H) 01/30/2023 1302   O2SAT 93 01/30/2023 1302     Coagulation Profile: Recent Labs  Lab 01/28/23 1028  INR 1.1    Cardiac Enzymes: No results for input(s): "CKTOTAL", "CKMB", "CKMBINDEX",  "TROPONINI" in the last 168 hours.  HbA1C:  Hgb A1c MFr Bld  Date/Time Value Ref Range Status  01/28/2023 10:28 AM 5.3 4.8 - 5.6 % Final    Comment:    (NOTE) Pre diabetes:          5.7%-6.4%  Diabetes:              >6.4%  Glycemic control for   <7.0% adults with diabetes     CBG: Recent Labs  Lab 01/30/23 1300  GLUCAP 138*    Review of Systems:   Unable to obtain as patient is intubated and sedated  Past Medical History:  He,  has a past medical history of Anal fissure, Anxiety, Aortic atherosclerosis (HCC) (04/03/2021), Cancer (HCC) (2022), Convulsions (HCC), Coronary artery calcification seen on CT scan (04/03/2021), Diverticulitis, Heart murmur, Hiatal hernia with gastroesophageal reflux, History of kidney stones, HLD (hyperlipidemia), Moderate to severe mitral regurgitation (10/06/2020), MVP (mitral valve prolapse), Normal cardiac stress test (02/2012), OSA on CPAP (11/11/2013), Sleep apnea, Tinnitus, Tobacco abuse, Tobacco use disorder (03/31/2014), and White coat hypertension.   Surgical History:   Past Surgical History:  Procedure Laterality Date   APPENDECTOMY  02/13/2004   COLONOSCOPY     INGUINAL HERNIA REPAIR Right    RIGHT/LEFT HEART CATH AND CORONARY ANGIOGRAPHY N/A 12/10/2022   Procedure: RIGHT/LEFT HEART CATH AND CORONARY ANGIOGRAPHY;  Surgeon: Orbie Pyo, MD;  Location: [facility] INVASIVE [facility];  Service: Cardiovascular;  Laterality: N/A;   ROTATOR CUFF REPAIR Left 02/12/2005   TEE WITHOUT CARDIOVERSION N/A 11/14/2022   Procedure: TRANSESOPHAGEAL ECHOCARDIOGRAM;  Surgeon: Parke Poisson, MD;  Location: [facility] INVASIVE CV [facility];  Service: Cardiovascular;  Laterality: N/A;     Social History:   reports that he quit smoking about 10 years ago. His smoking use included cigarettes. He started smoking about 55 years ago. He has a 22.5 pack-year smoking history. He has never used smokeless tobacco. He reports that he does not drink alcohol and does not use drugs.    Family History:  His family history includes Cancer in his mother; Cancer (age of onset: 63) in an other family member; Hypertension in an other family member; Sleep apnea in his brother.   Allergies No Known Allergies   Home Medications  Prior to Admission medications   Medication Sig Start Date End Date Taking? Authorizing Provider  aspirin 81 MG tablet Take 81 mg by mouth daily.   Yes [provider]  B Complex CAPS Take 1 capsule by mouth daily.   Yes [provider]  cholecalciferol (VITAMIN D) 1000 UNITS tablet Take 1,000 Units by mouth daily.   Yes [provider]  FIBER COMPLETE PO Take 2 tablets by mouth daily.   Yes [provider]  fluticasone (FLONASE) 50 MCG/ACT nasal spray Place 2 sprays into both nostrils daily as needed for allergies. 10/16/11  Yes [provider]  loratadine (CLARITIN) 10 MG tablet Take 10 mg by mouth daily as needed for allergies.   Yes [provider]  losartan (COZAAR) 25 MG tablet Take 50 mg by mouth at bedtime. 09/15/18  Yes [provider]  NON FORMULARY Pt uses a cpap nightly   Yes [provider]  pantoprazole (PROTONIX) 40 MG tablet Take 40 mg by mouth daily.   Yes [provider]  sertraline (ZOLOFT) 50 MG tablet Take 50 mg by mouth daily.   Yes [provider]  simvastatin (ZOCOR) 20 MG tablet Take 20 mg by mouth at bedtime.   Yes [provider]  tamsulosin (  FLOMAX) 0.4 MG CAPS capsule Take 0.4 mg by mouth every evening. 11/09/19  Yes [provider]     Critical care time:     The patient is critically ill due to critical care was necessary to treat or prevent imminent or life-threatening deterioration.  Critical care was time spent personally by me on the following activities: development of treatment plan with patient and/or surrogate as well as nursing, discussions with consultants, evaluation of patient's response to treatment,  examination of patient, obtaining history from patient or surrogate, ordering and performing treatments and interventions, ordering and review of laboratory studies, ordering and review of radiographic studies, pulse oximetry, re-evaluation of patient's condition and participation in multidisciplinary rounds.   During this encounter critical care time was devoted to patient care services described in this note for 35 minutes.     Cheri Fowler, MD [facility] See Amion for pager If no response to pager, please call [phone] until 7pm After 7pm, Please call E-link [phone]

## 2023-01-30 NOTE — Anesthesia Postprocedure Evaluation (Signed)
Anesthesia Post Note  Patient: Michael [name]  Procedure(s) Performed: MITRAL VALVE REPAIR (MVR) UTILIZING SIMULUS SEMI-RIGID ANNULOPLASTY BAND SIZE (Chest) TRANSESOPHAGEAL ECHOCARDIOGRAM (TEE)     Patient location during evaluation: ICU Anesthesia Type: General Level of consciousness: sedated Pain management: pain level controlled Vital Signs Assessment: post-procedure vital signs reviewed and stable Respiratory status: patient remains intubated per anesthesia plan and patient on ventilator - see flowsheet for VS Cardiovascular status: stable Postop Assessment: no apparent nausea or vomiting Anesthetic complications: no   No notable events documented.  Last Vitals:  Vitals:   01/30/23 0822 01/30/23 0823  Pulse: (!) 42 (!) 42  Resp: 14 16  Temp:    SpO2: 93% 94%    Last Pain:  Vitals:   01/30/23 0724  TempSrc:   PainSc: 0-No pain                 Mariann Barter

## 2023-01-30 NOTE — Anesthesia Procedure Notes (Signed)
Arterial Line Insertion Start/End12/18/2024 8:00 AM, 01/30/2023 8:08 AM Performed by: Little Ishikawa, CRNA, CRNA  Patient location: Pre-op. Preanesthetic checklist: patient identified, IV checked, site marked, risks and benefits discussed, surgical consent, monitors and equipment checked, pre-op evaluation, timeout performed and anesthesia consent Lidocaine 1% used for infiltration and patient sedated Left, radial was placed Catheter size: 20 G Hand hygiene performed  and maximum sterile barriers used  Allen's test indicative of satisfactory collateral circulation Attempts: 1 Procedure performed without using ultrasound guided technique. Following insertion, dressing applied and Biopatch. Post procedure assessment: normal  Patient tolerated the procedure well with no immediate complications.

## 2023-01-30 NOTE — Procedures (Signed)
Extubation Procedure Note  Patient Details:   Name: Michael [name] DOB: 1950/09/28 MRN: [id number]   Airway Documentation:    Vent end date: 01/30/23 Vent end time: 1710   Evaluation  O2 sats: stable throughout Complications: No apparent complications Patient did tolerate procedure well. Bilateral Breath Sounds: Diminished   Yes  NIF > - 20 VC > 10cc/kg  Rhen Dossantos 01/30/2023, 5:11 PM

## 2023-01-31 ENCOUNTER — Encounter (HOSPITAL_COMMUNITY): Payer: Self-pay | Admitting: Thoracic Surgery (Cardiothoracic Vascular Surgery)

## 2023-01-31 ENCOUNTER — Other Ambulatory Visit: Payer: Self-pay

## 2023-01-31 ENCOUNTER — Inpatient Hospital Stay (HOSPITAL_COMMUNITY): Payer: Medicare Other

## 2023-01-31 DIAGNOSIS — Z9889 Other specified postprocedural states: Secondary | ICD-10-CM | POA: Diagnosis not present

## 2023-01-31 DIAGNOSIS — I482 Chronic atrial fibrillation, unspecified: Secondary | ICD-10-CM | POA: Diagnosis not present

## 2023-01-31 DIAGNOSIS — I340 Nonrheumatic mitral (valve) insufficiency: Secondary | ICD-10-CM | POA: Diagnosis not present

## 2023-01-31 DIAGNOSIS — R001 Bradycardia, unspecified: Secondary | ICD-10-CM | POA: Diagnosis not present

## 2023-01-31 LAB — GLUCOSE, CAPILLARY
Glucose-Capillary: 103 mg/dL — ABNORMAL HIGH (ref 70–99)
Glucose-Capillary: 103 mg/dL — ABNORMAL HIGH (ref 70–99)
Glucose-Capillary: 104 mg/dL — ABNORMAL HIGH (ref 70–99)
Glucose-Capillary: 114 mg/dL — ABNORMAL HIGH (ref 70–99)
Glucose-Capillary: 117 mg/dL — ABNORMAL HIGH (ref 70–99)
Glucose-Capillary: 127 mg/dL — ABNORMAL HIGH (ref 70–99)
Glucose-Capillary: 133 mg/dL — ABNORMAL HIGH (ref 70–99)
Glucose-Capillary: 63 mg/dL — ABNORMAL LOW (ref 70–99)
Glucose-Capillary: 75 mg/dL (ref 70–99)
Glucose-Capillary: 87 mg/dL (ref 70–99)
Glucose-Capillary: 89 mg/dL (ref 70–99)
Glucose-Capillary: 91 mg/dL (ref 70–99)
Glucose-Capillary: 94 mg/dL (ref 70–99)

## 2023-01-31 LAB — BASIC METABOLIC PANEL
Anion gap: 6 (ref 5–15)
Anion gap: 11 (ref 5–15)
BUN: 17 mg/dL (ref 8–23)
BUN: 20 mg/dL (ref 8–23)
CO2: 21 mmol/L — ABNORMAL LOW (ref 22–32)
CO2: 22 mmol/L (ref 22–32)
Calcium: 7.4 mg/dL — ABNORMAL LOW (ref 8.9–10.3)
Calcium: 7.8 mg/dL — ABNORMAL LOW (ref 8.9–10.3)
Chloride: 109 mmol/L (ref 98–111)
Chloride: 102 mmol/L (ref 98–111)
Creatinine, Ser: 1.3 mg/dL — ABNORMAL HIGH (ref 0.61–1.24)
Creatinine, Ser: 1.58 mg/dL — ABNORMAL HIGH (ref 0.61–1.24)
GFR, Estimated: 58 mL/min — ABNORMAL LOW (ref >60)
GFR, Estimated: 46 mL/min — ABNORMAL LOW (ref >60)
Glucose, Bld: 121 mg/dL — ABNORMAL HIGH (ref 70–99)
Glucose, Bld: 103 mg/dL — ABNORMAL HIGH (ref 70–99)
Potassium: 4.2 mmol/L (ref 3.5–5.1)
Potassium: 4.7 mmol/L (ref 3.5–5.1)
Sodium: 136 mmol/L (ref 135–145)
Sodium: 135 mmol/L (ref 135–145)

## 2023-01-31 LAB — CBC
HCT: 39.4 % (ref 39.0–52.0)
HCT: 38.1 % — ABNORMAL LOW (ref 39.0–52.0)
Hemoglobin: 13.1 g/dL (ref 13.0–17.0)
Hemoglobin: 12.6 g/dL — ABNORMAL LOW (ref 13.0–17.0)
MCH: 31.6 pg (ref 26.0–34.0)
MCH: 31.7 pg (ref 26.0–34.0)
MCHC: 33.2 g/dL (ref 30.0–36.0)
MCHC: 33.1 g/dL (ref 30.0–36.0)
MCV: 94.9 fL (ref 80.0–100.0)
MCV: 96 fL (ref 80.0–100.0)
Platelets: 191 10*3/uL (ref 150–400)
Platelets: 151 10*3/uL (ref 150–400)
RBC: 4.15 MIL/uL — ABNORMAL LOW (ref 4.22–5.81)
RBC: 3.97 MIL/uL — ABNORMAL LOW (ref 4.22–5.81)
RDW: 13.9 % (ref 11.5–15.5)
RDW: 14 % (ref 11.5–15.5)
WBC: 21.8 10*3/uL — ABNORMAL HIGH (ref 4.0–10.5)
WBC: 22 10*3/uL — ABNORMAL HIGH (ref 4.0–10.5)
nRBC: 0 % (ref 0.0–0.2)
nRBC: 0 % (ref 0.0–0.2)

## 2023-01-31 LAB — MAGNESIUM
Magnesium: 2.4 mg/dL (ref 1.7–2.4)
Magnesium: 2.4 mg/dL (ref 1.7–2.4)

## 2023-01-31 MED ORDER — INFLUENZA VAC A&B SURF ANT ADJ 0.5 ML IM SUSY
0.5000 mL | PREFILLED_SYRINGE | INTRAMUSCULAR | Status: AC
  Administered 2023-02-04: 0.5 mL via INTRAMUSCULAR
  Filled 2023-01-31: qty 0.5

## 2023-01-31 MED ORDER — INSULIN ASPART 100 UNIT/ML IJ SOLN
0.0000 [IU] | INTRAMUSCULAR | Status: DC
  Administered 2023-01-31: 2 [IU] via SUBCUTANEOUS

## 2023-01-31 MED ORDER — POTASSIUM CHLORIDE CRYS ER 20 MEQ PO TBCR
20.0000 meq | EXTENDED_RELEASE_TABLET | Freq: Two times a day (BID) | ORAL | Status: AC
Start: 2023-01-31 — End: ?
  Administered 2023-01-31 – 2023-02-04 (×10): 20 meq via ORAL
  Filled 2023-01-31 (×10): qty 1

## 2023-01-31 MED ORDER — ENOXAPARIN SODIUM 40 MG/0.4ML IJ SOSY
40.0000 mg | PREFILLED_SYRINGE | Freq: Every day | INTRAMUSCULAR | Status: DC
  Administered 2023-01-31 – 2023-02-04 (×5): 40 mg via SUBCUTANEOUS
  Filled 2023-01-31 (×5): qty 0.4

## 2023-01-31 MED ORDER — PNEUMOCOCCAL 20-VAL CONJ VACC 0.5 ML IM SUSY: 0.5000 mL | PREFILLED_SYRINGE | INTRAMUSCULAR | Status: DC | PRN

## 2023-01-31 MED ORDER — FUROSEMIDE 10 MG/ML IJ SOLN
40.0000 mg | Freq: Two times a day (BID) | INTRAMUSCULAR | Status: DC
  Administered 2023-01-31 – 2023-02-01 (×4): 40 mg via INTRAVENOUS
  Filled 2023-01-31 (×4): qty 4

## 2023-01-31 NOTE — Plan of Care (Signed)
  Problem: Education: Goal: Knowledge of General Education information will improve Description Including pain rating scale, medication(s)/side effects and non-pharmacologic comfort measures Outcome: Progressing   

## 2023-01-31 NOTE — Progress Notes (Signed)
NAME:  LINDY YELINEK, MRN:  [id number], DOB:  04-20-1950, LOS: 1 ADMISSION DATE:  01/30/2023, CONSULTATION DATE:  12/18 REFERRING MD:  Leafy Ro, CHIEF COMPLAINT:  s/p MVR   History of Present Illness:  72 year old male with past medical history of CAD, hiatal hernia, severe mitral regurgitation, MVP, OSA on CPAP, tobacco use, hyperlipidemia presents for mitral valve repair. He had lifelong murmur evaluated showing severe mitral regurgitation.   OR 12/18 with Dr. Leafy Ro and had complex mitral valve repair and closure of PFO. Intraoperative TEE showed EF 35-40%, global hypokinesis of LV. RV okay.   Pump time: 1hr 70m Xclamp time: 1hr 59m EBL: 1355cc Cell Saver: 680cc  Transferred to ICU post-operative. PCCM consulted for post-op vent management, medical management.   Pertinent  Medical History  CAD, hiatal hernia, HLD, severe mitral regurg, MVP, OSA on CPAP, tobacco use   Significant Hospital Events: Including procedures, antibiotic start and stop dates in addition to other pertinent events   12/18: s/p MVR, PFO closure, extubated  12/19: extubated yesterday without incident. No significant CT output. Still a little vasoplegic on neo 20. A-paced. Stop amiodarone. Underlying rhythm junctional. Starting lasix   Interim History / Subjective:  Awake and alert. He feels okay. Mild post-operative pain. Denies shortness of breath, chest pain, lightheadedness.   Objective   Blood pressure 126/64, pulse 83, temperature 99.7 F (37.6 C), resp. rate (!) 25, height 5\' 11"  (1.803 m), weight 109.5 kg, SpO2 93%. PAP: (9-335)/(-20-326) 34/15 CVP:  [6 mmHg] 6 mmHg CO:  [4.4 L/min-9.4 L/min] 9.4 L/min CI:  [1.98 L/min/m2-4.2 L/min/m2] 4.2 L/min/m2  Vent Mode: Stand-by FiO2 (%):  [36 %-50 %] 36 % Set Rate:  [4 bmp-20 bmp] 4 bmp Vt Set:  [600 mL] 600 mL PEEP:  [5 cmH20] 5 cmH20 Pressure Support:  [10 cmH20] 10 cmH20   Intake/Output Summary (Last 24 hours) at 01/31/2023 0708 Last data filed at  01/31/2023 0700 Gross per 24 hour  Intake 6872.14 ml  Output 3133 ml  Net 3739.14 ml   Filed Weights   01/30/23 0700 01/31/23 0545  Weight: 104.3 kg 109.5 kg    Examination: General: older male, sitting in bed, no acute distress  HENT: [place]/AT, anicteric sclera, mucous membranes moist, [place] 3L  Lungs: diminshed bases, no wheezing, rhonchi, 3LNC, respirations even and unlabored  Cardiovascular: s1/s2, A-paced, no murmur, rub, gallop Abdomen: rounded, soft, diminished BS Extremities: trace edema Neuro: alert, oriented, non focal exam GU: foley  Resolved Hospital Problem list     Assessment & Plan:  Severe mitral regurgitation due to MVP s/p complex mitral valve repair Patent foramen ovale status post closure - con't asa, statin  - stop amio, hold metoprolol - lasix 40mg  BID per TCTS  - start lovenox today  - off insulin gtt onto SSI  - dc pa catheter and a-line  - CT management per TCTS  - multimodal pain control with tramadol, oxy, morphine  - still somewhat vasoplegic on neo 20, titrate off to MAP goal 65  - PT  Acute respiratory insufficiency, postop; extubated 12/18 without incident  Tobacco use, 20 pack year hx, quit 2014  - wean supplemental O2 for sat >92%  - IS  - pulmonary hygiene - PT to get OOB   Hyperlipidemia - con't simvistatin 20mg  daily    Obesity - diet, exercise counseling   Best Practice (right click and "Reselect all SmartList Selections" daily)   Diet/type: Regular consistency (see orders) DVT prophylaxis: SCD Pressure ulcer(s): na GI prophylaxis:  PPI Lines: Central line, Arterial Line, and No longer needed.  Order written to d/c  Foley:  Yes, and it is still needed Code Status:  full code Last date of multidisciplinary goals of care discussion [per primary]  Labs   CBC: Recent Labs  Lab 01/28/23 1028 01/30/23 0859 01/30/23 1107 01/30/23 1147 01/30/23 1300 01/30/23 1302 01/30/23 1702 01/30/23 1808 01/30/23 1900 01/30/23 1904  01/31/23 0356  WBC 9.0  --   --   --  21.6*  --   --   --   --  21.3* 21.8*  HGB 15.6   < > 11.6*   < > 14.2   < > 14.6 14.3 15.0 15.1 13.1  HCT 46.7   < > 34.7*   < > 42.2   < > 43.0 42.0 44.0 44.9 39.4  MCV 93.4  --   --   --  94.4  --   --   --   --  94.1 94.9  PLT 242  --  175  --  177  --   --   --   --  213 191   < > = values in this interval not displayed.    Basic Metabolic Panel: Recent Labs  Lab 01/28/23 1028 01/30/23 0859 01/30/23 0947 01/30/23 1003 01/30/23 1033 01/30/23 1103 01/30/23 1150 01/30/23 1302 01/30/23 1702 01/30/23 1808 01/30/23 1900 01/30/23 1904 01/31/23 0356  NA 142   < > 141   < > 141   < > 140   < > 141 139 140 140 136  K 3.9   < > 4.1   < > 4.6   < > 4.6   < > 4.5 4.7 4.8 4.8 4.2  CL 109   < > 105  --  104  --  105  --   --   --   --  109 109  CO2 25  --   --   --   --   --   --   --   --   --   --  21* 21*  GLUCOSE 94   < > 103*  --  120*  --  140*  --   --   --   --  129* 121*  BUN 14   < > 14  --  14  --  14  --   --   --   --  15 17  CREATININE 1.48*   < > 1.20  --  1.10  --  1.10  --   --   --   --  1.63* 1.30*  CALCIUM 9.2  --   --   --   --   --   --   --   --   --   --  8.0* 7.4*  MG  --   --   --   --   --   --   --   --   --   --   --  2.7* 2.4   < > = values in this interval not displayed.   GFR: Estimated Creatinine Clearance: 63.7 mL/min (A) (by C-G formula based on SCr of 1.3 mg/dL (H)). Recent Labs  Lab 01/28/23 1028 01/30/23 1300 01/30/23 1904 01/31/23 0356  WBC 9.0 21.6* 21.3* 21.8*    Liver Function Tests: Recent Labs  Lab 01/28/23 1028  AST 25  ALT 22  ALKPHOS 87  BILITOT 0.8  PROT 6.6  ALBUMIN 3.7  No results for input(s): "LIPASE", "AMYLASE" in the last 168 hours. No results for input(s): "AMMONIA" in the last 168 hours.  ABG    Component Value Date/Time   PHART 7.317 (L) 01/30/2023 1900   PCO2ART 47.4 01/30/2023 1900   PO2ART 77 (L) 01/30/2023 1900   HCO3 24.3 01/30/2023 1900   TCO2 26 01/30/2023  1900   ACIDBASEDEF 2.0 01/30/2023 1900   O2SAT 94 01/30/2023 1900     Coagulation Profile: Recent Labs  Lab 01/28/23 1028 01/30/23 1300  INR 1.1 1.3*    Cardiac Enzymes: No results for input(s): "CKTOTAL", "CKMB", "CKMBINDEX", "TROPONINI" in the last 168 hours.  HbA1C: Hgb A1c MFr Bld  Date/Time Value Ref Range Status  01/28/2023 10:28 AM 5.3 4.8 - 5.6 % Final    Comment:    (NOTE) Pre diabetes:          5.7%-6.4%  Diabetes:              >6.4%  Glycemic control for   <7.0% adults with diabetes     CBG: Recent Labs  Lab 01/31/23 0107 01/31/23 0206 01/31/23 0209 01/31/23 0354 01/31/23 0457  GLUCAP 87 63* 75 127* 91    Review of Systems:   As above  Past Medical History:  He,  has a past medical history of Anal fissure, Anxiety, Aortic atherosclerosis (HCC) (04/03/2021), Cancer (HCC) (2022), Convulsions (HCC), Coronary artery calcification seen on CT scan (04/03/2021), Diverticulitis, Heart murmur, Hiatal hernia with gastroesophageal reflux, History of kidney stones, HLD (hyperlipidemia), Moderate to severe mitral regurgitation (10/06/2020), MVP (mitral valve prolapse), Normal cardiac stress test (02/2012), OSA on CPAP (11/11/2013), Sleep apnea, Tinnitus, Tobacco abuse, Tobacco use disorder (03/31/2014), and White coat hypertension.   Surgical History:   Past Surgical History:  Procedure Laterality Date   APPENDECTOMY  02/13/2004   COLONOSCOPY     INGUINAL HERNIA REPAIR Right    MITRAL VALVE REPAIR N/A 01/30/2023   Procedure: MITRAL VALVE REPAIR (MVR) UTILIZING SIMULUS SEMI-RIGID ANNULOPLASTY BAND SIZE ;  Surgeon: Eugenio Hoes, MD;  Location: [facility] OR;  Service: Open Heart Surgery;  Laterality: N/A;   RIGHT/LEFT HEART CATH AND CORONARY ANGIOGRAPHY N/A 12/10/2022   Procedure: RIGHT/LEFT HEART CATH AND CORONARY ANGIOGRAPHY;  Surgeon: Orbie Pyo, MD;  Location: [facility] INVASIVE CV LAB;  Service: Cardiovascular;  Laterality: N/A;   ROTATOR CUFF REPAIR Left  02/12/2005   TEE WITHOUT CARDIOVERSION N/A 11/14/2022   Procedure: TRANSESOPHAGEAL ECHOCARDIOGRAM;  Surgeon: Parke Poisson, MD;  Location: [facility] INVASIVE CV LAB;  Service: Cardiovascular;  Laterality: N/A;   TEE WITHOUT CARDIOVERSION N/A 01/30/2023   Procedure: TRANSESOPHAGEAL ECHOCARDIOGRAM (TEE);  Surgeon: Eugenio Hoes, MD;  Location: [facility] OR;  Service: Open Heart Surgery;  Laterality: N/A;     Social History:   reports that he quit smoking about 10 years ago. His smoking use included cigarettes. He started smoking about 55 years ago. He has a 22.5 pack-year smoking history. He has never used smokeless tobacco. He reports that he does not drink alcohol and does not use drugs.   Family History:  His family history includes Cancer in his mother; Cancer (age of onset: 29) in an other family member; Hypertension in an other family member; Sleep apnea in his brother.   Allergies No Known Allergies   Home Medications  Prior to Admission medications   Medication Sig Start Date End Date Taking? Authorizing Provider  aspirin 81 MG tablet Take 81 mg by mouth daily.   Yes [provider]  B  Complex CAPS Take 1 capsule by mouth daily.   Yes [provider]  cholecalciferol (VITAMIN D) 1000 UNITS tablet Take 1,000 Units by mouth daily.   Yes [provider]  FIBER COMPLETE PO Take 2 tablets by mouth daily.   Yes [provider]  fluticasone (FLONASE) 50 MCG/ACT nasal spray Place 2 sprays into both nostrils daily as needed for allergies. 10/16/11  Yes [provider]  loratadine (CLARITIN) 10 MG tablet Take 10 mg by mouth daily as needed for allergies.   Yes [provider]  losartan (COZAAR) 25 MG tablet Take 50 mg by mouth at bedtime. 09/15/18  Yes [provider]  NON FORMULARY Pt uses a cpap nightly   Yes [provider]  pantoprazole (PROTONIX) 40 MG tablet Take 40 mg by mouth daily.   Yes [provider]  sertraline  (ZOLOFT) 50 MG tablet Take 50 mg by mouth daily.   Yes [provider]  simvastatin (ZOCOR) 20 MG tablet Take 20 mg by mouth at bedtime.   Yes [provider]  tamsulosin (FLOMAX) 0.4 MG CAPS capsule Take 0.4 mg by mouth every evening. 11/09/19  Yes [provider]     Critical care time: 41    Lenard Galloway [facility] 01/31/23 7:12 AM  Please see [email or web address] for pager details.  From 7A-7P if no response, please call [phone] After hours, please call ELink [phone]

## 2023-01-31 NOTE — Progress Notes (Signed)
TCTS DAILY ICU PROGRESS NOTE                   [address]            [place] [address]          [phone]   1 Day Post-Op Procedure(s) (LRB): MITRAL VALVE REPAIR (MVR) UTILIZING SIMULUS SEMI-RIGID ANNULOPLASTY BAND SIZE (N/A) TRANSESOPHAGEAL ECHOCARDIOGRAM (TEE) (N/A)  Total Length of Stay:  LOS: 1 day   Subjective: Extubated at around 5pm.  Awake and alert, says pain is controlled.   Amiodarone and Neo infusing.   Objective: Vital signs in last 24 hours: Temp:  [97.5 F (36.4 C)-100.4 F (38 C)] 99.7 F (37.6 C) (12/19 0700) Pulse Rate:  [41-92] 83 (12/19 0700) Cardiac Rhythm: A-V Sequential paced;Bundle branch block (12/18 2030) Resp:  [10-31] 25 (12/19 0700) BP: (89-126)/(53-79) 126/64 (12/19 0600) SpO2:  [87 %-98 %] 93 % (12/19 0700) Arterial Line BP: (64-133)/(44-72) 111/52 (12/19 0700) FiO2 (%):  [36 %-50 %] 36 % (12/18 2225) Weight:  [109.5 kg] 109.5 kg (12/19 0545)  Filed Weights   01/30/23 0700 01/31/23 0545  Weight: 104.3 kg 109.5 kg    Weight change: 5.173 kg   Hemodynamic parameters for last 24 hours: PAP: (9-335)/(-20-326) 34/15 CVP:  [6 mmHg] 6 mmHg CO:  [4.4 L/min-9.4 L/min] 9.4 L/min CI:  [1.98 L/min/m2-4.2 L/min/m2] 4.2 L/min/m2  Intake/Output from previous day: 12/18 0701 - 12/19 0700 In: 6872.1 [P.O.:720; I.V.:3461.2; Blood:680; IV Piggyback:2010.9] Out: 3133 [Urine:1310; Blood:1355; Chest Tube:468]  Intake/Output this shift: No intake/output data recorded.  Current Meds: Scheduled Meds:  acetaminophen  1,000 mg Oral Q6H   Or   acetaminophen (TYLENOL) oral liquid 160 mg/5 mL  1,000 mg Per Tube Q6H   acetaminophen (TYLENOL) oral liquid 160 mg/5 mL  650 mg Per Tube Once   aspirin EC  325 mg Oral Daily   Or   aspirin  324 mg Per Tube Daily   bisacodyl  10 mg Oral Daily   Or   bisacodyl  10 mg Rectal Daily   Chlorhexidine Gluconate Cloth  6 each Topical Daily   docusate sodium  200 mg Oral Daily    metoCLOPramide (REGLAN) injection  10 mg Intravenous Q6H   [START ON 02/01/2023] pantoprazole  40 mg Oral Daily   pantoprazole (PROTONIX) IV  40 mg Intravenous QHS   sertraline  50 mg Oral Daily   simvastatin  20 mg Oral QHS   sodium chloride flush  10 mL Intravenous Q12H   sodium chloride flush  10 mL Intravenous Q12H   sodium chloride flush  3 mL Intravenous Q12H   tamsulosin  0.4 mg Oral QPM   Continuous Infusions:  sodium chloride     sodium chloride 10 mL/hr at 01/31/23 0700   albumin human Stopped (01/31/23 0107)    ceFAZolin (ANCEF) IV Stopped (01/31/23 0617)   dexmedetomidine (PRECEDEX) IV infusion Stopped (01/30/23 1448)   insulin 0.4 Units/hr (01/31/23 0700)   lactated ringers 20 mL/hr at 01/31/23 0700   nitroGLYCERIN     phenylephrine (NEO-SYNEPHRINE) Adult infusion 50 mcg/min (01/31/23 0705)   PRN Meds:.sodium chloride, albumin human, dextrose, metoprolol tartrate, midazolam, morphine injection, ondansetron (ZOFRAN) IV, oxyCODONE, sodium chloride flush, traMADol  General appearance: alert, cooperative, and no distress Neurologic: intact Heart: Currently paced AOO, slow JR when pacing is interrupted Lungs: normal work of breathing on 3Lnc O2.  Breath sounds are clear, shallow. CXR showing bibasilar volume loss. The PA catheter  is curled in the pulmonary A. outflow tract. Abdomen: soft, no tenderness Extremities: mild peripheral edema, all well perfused Wound: the sternal incision is covered with a dry dressing.   Lab Results: CBC: Recent Labs    01/30/23 1904 01/31/23 0356  WBC 21.3* 21.8*  HGB 15.1 13.1  HCT 44.9 39.4  PLT 213 191   BMET:  Recent Labs    01/30/23 1904 01/31/23 0356  NA 140 136  K 4.8 4.2  CL 109 109  CO2 21* 21*  GLUCOSE 129* 121*  BUN 15 17  CREATININE 1.63* 1.30*  CALCIUM 8.0* 7.4*    CMET: Lab Results  Component Value Date   WBC 21.8 (H) 01/31/2023   HGB 13.1 01/31/2023   HCT 39.4 01/31/2023   PLT 191 01/31/2023   GLUCOSE  121 (H) 01/31/2023   ALT 22 01/28/2023   AST 25 01/28/2023   NA 136 01/31/2023   K 4.2 01/31/2023   CL 109 01/31/2023   CREATININE 1.30 (H) 01/31/2023   BUN 17 01/31/2023   CO2 21 (L) 01/31/2023   INR 1.3 (H) 01/30/2023   HGBA1C 5.3 01/28/2023      PT/INR:  Recent Labs    01/30/23 1300  LABPROT 16.8*  INR 1.3*   Radiology: [facility] Chest Port 1 View Result Date: 01/30/2023 CLINICAL DATA:  Status post mitral valve repair. EXAM: PORTABLE CHEST 1 VIEW COMPARISON:  January 28, 2023. FINDINGS: Endotracheal and nasogastric tubes are in grossly good position. Right internal jugular Swan-Ganz catheter is noted distal tip in expected position of left pulmonary artery. Mild bibasilar atelectasis or edema is noted with small left pleural effusion. No definite pneumothorax is noted. Bony thorax is unremarkable. IMPRESSION: Support apparatus as noted above. Mild bibasilar atelectasis or edema is noted with small left pleural effusion. Electronically Signed   By: Lupita Raider M.D.   On: 01/30/2023 14:26   ECHO INTRAOPERATIVE TEE Result Date: 01/30/2023  *INTRAOPERATIVE TRANSESOPHAGEAL REPORT *  Patient Name:   BUCKY LENKIEWICZ [place] Date of Exam: 01/30/2023 Medical Rec #:  [id number]       Height:       71.0 in Accession #:    [id number]      Weight:       230.0 lb Date of Birth:  1950/03/18       BSA:          2.24 m Patient Age:    72 years        BP:           0/0 mmHg Patient Gender: M               HR:           94 bpm. Exam Location:  Inpatient Transesophogeal exam was perform intraoperatively during surgical procedure. Patient was closely monitored under general anesthesia during the entirety of examination. Indications:     Mitral regurgitation Performing Phys: Mariann Barter MD Diagnosing Phys: Roslynn Amble Complications: No known complications during this procedure. POST-OP IMPRESSIONS _ Left Ventricle: has moderately reduced systolic function, with an ejection fraction of 35-40%. The wall motion is  globally hypokinetic without significant regional wall motion abnormalities. _ Right Ventricle: The right ventricle appears unchanged from pre-bypass, with normal systolic function. _ Aorta: The aorta appears unchanged from pre-bypass; there is no dissection present in the aorta. _ Aortic Valve: The aortic valve appears unchanged from pre-bypass. There is trace regurgitation. _ Mitral Valve: Trace residual mitral regurgitation. Mean trans-mitral gradient  2 to . . The mitral valve is status post repair with an annuloplasty ring. _ Tricuspid Valve: The tricuspid valve appears unchanged from pre-bypass. There is mild regurgitation. _ Pulmonic Valve: The pulmonic valve appears unchanged from pre-bypass. _ Interatrial Septum: The interatrial septum appears unchanged from pre-bypass. _ Pericardium: The pericardium appears unchanged from pre-bypass. PRE-OP FINDINGS  Left Ventricle: The left ventricle has normal systolic function, with an ejection fraction of 55-60%. The cavity size was normal. Right Ventricle: The right ventricle has normal systolic function. The cavity was normal. There is no increase in right ventricular wall thickness. Left Atrium: Left atrial size was not assessed. No left atrial/left atrial appendage thrombus was detected. Right Atrium: Right atrial size was not assessed. Interatrial Septum: No atrial level shunt detected by color flow Doppler. Pericardium: Trivial pericardial effusion is present. There is no pleural effusion. Mitral Valve: Prolapse of the posterior leaflet at approximately the P1/P2 juncture. Mitral valve regurgitation is moderate by color flow Doppler. The MR jet is anteriorly-directed. There is no evidence of mitral stenosis. Tricuspid Valve: The tricuspid valve was normal in structure. Tricuspid valve regurgitation is mild by color flow Doppler. No evidence of tricuspid stenosis is present. Aortic Valve: The aortic valve is normal in structure. Aortic valve regurgitation was  not visualized by color flow Doppler. There is no stenosis of the aortic valve. Pulmonic Valve: The pulmonic valve was normal in structure No evidence of pumonic stenosis. Pulmonic valve regurgitation is trivial by color flow Doppler. Aorta: The aortic root, ascending aorta and aortic arch are normal in size and structure.  Roslynn Amble Electronically signed by Roslynn Amble Signature Date/Time: 01/30/2023/1:51:28 PM    Final      Assessment/Plan: S/P Procedure(s) (LRB): MITRAL VALVE REPAIR (MVR) UTILIZING SIMULUS SEMI-RIGID ANNULOPLASTY BAND SIZE (N/A) TRANSESOPHAGEAL ECHOCARDIOGRAM (TEE) (N/A)   -POD1 mitral valve repair for severe MR with closure of a small PFO.  Vasoplegic with BP supported with Neo. In slow JR with ventricular ectopy but is conducting through AV node. Currently A-paced.  Will stop the amiodarone that was initiated in the OR for post-cardiopulmonary bypass VT and also hold off on the metoprolol.  Mr. Mcvoy was seen by Dr. Lalla Brothers earlier this year for bradycardia.  Will ask EP to follow post-op. D/C the PA cathter, and arterial line.   -Leukocytosis- likely reactive. No clinical evidence of infection but will follow.   -PULM- former 20py smoker quit in 2014.  Oxygenating welll on 3Lnc. Has expected volume loss  / ATX on CXR.  Mobilize, encouraging pulm hygiene.   -ENDO- no h/o DM. Transition off insulin infusion and continue SSI.   -RENAL- baseline creat ~1.4.  UO adequate, Wt is 5kg+.  Start Lasix today.   -DVT PPX- SCD's in place. Mobilize, begin daily enoxaparin.        Leary Roca, PA-C [phone] 01/31/2023 7:34 AM

## 2023-01-31 NOTE — Consult Note (Addendum)
ELECTROPHYSIOLOGY CONSULT NOTE    Patient ID: [name] MRN: [id number], DOB/AGE: 1950-08-03 72 y.o.  Admit date: 01/30/2023 Date of Consult: 01/31/2023  Primary Physician: [name], MD Primary Cardiologist: Kristeen Miss, MD  Electrophysiologist: Dr. Lalla Brothers   Referring Provider: Dr. Leafy Ro  Patient Profile: Michael [name] is a 72 y.o. male with a history of bradycardia, HLD, MV prolapse/severe mitral valve regurgitation, OSA, tobacco abuse, BPH, and obesity who is being seen today for the evaluation of VT & bradycardia at the request of Dr. Nevin Bloodgood.  HPI:  Michael [name] is a 72 y.o. male admitted 12/18 for planned mitral valve repair and closure of PFO.  Previously seen by Dr. Lalla Brothers in 2022 for bradycardia in the setting of beta-blocker.  At that time he was experiencing heart rates in the 40s on nebivolol.  This was stopped and BP heart rate remained stable with heart rates in the 50s.  He had appropriate chronotropic response with ambulation in the clinic.  At that time it was felt there was no indication for permanent pacemaker.  On 12/18, the patient underwent complex mitral valve repair and closure of PFO.  Intraoperative TEE showed LVEF of 35-40% with global hypokinesis, suspected LV stunning after coming off pump.  Patient reportedly had VT when coming off pump.  He was started on amiodarone.  He returned to the ICU postoperatively intubated & was subsequently extubated without event.  He continued to require Neo-Synephrine at low dose postoperatively.  Patient A-paced in the postop period with underlying slow junctional rhythm with ventricular ectopy.  He denies chest pain, palpitations, dyspnea, PND, orthopnea, nausea, vomiting, dizziness, syncope, edema, weight gain, or early satiety.   Labs Potassium4.2 (12/19 0865) Magnesium  2.4 (12/19 0356) Creatinine, ser  1.30* (12/19 0356) PLT  191 (12/19 0356) HGB  13.1 (12/19 0356) WBC 21.8* (12/19 0356)  .     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 and moderate to severe mitral regurgitation   MVP (mitral valve prolapse)    with moderate MR per echo in 2009   Normal cardiac stress test 02/2012   OSA on CPAP 11/11/2013   Sleep apnea    wears CPAP   Tinnitus    Tobacco abuse    Tobacco use disorder 03/31/2014   White coat hypertension      Surgical History:  Past Surgical History:  Procedure Laterality Date   APPENDECTOMY  02/13/2004   COLONOSCOPY     INGUINAL HERNIA REPAIR Right    MITRAL VALVE REPAIR N/A 01/30/2023   Procedure: MITRAL VALVE REPAIR (MVR) UTILIZING SIMULUS SEMI-RIGID ANNULOPLASTY BAND SIZE ;  Surgeon: Eugenio Hoes, MD;  Location: [facility] OR;  Service: Open Heart Surgery;  Laterality: N/A;   RIGHT/LEFT HEART CATH AND CORONARY ANGIOGRAPHY N/A 12/10/2022   Procedure:  RIGHT/LEFT HEART CATH AND CORONARY ANGIOGRAPHY;  Surgeon: Orbie Pyo, MD;  Location: [facility] INVASIVE CV LAB;  Service: Cardiovascular;  Laterality: N/A;   ROTATOR CUFF REPAIR Left 02/12/2005   TEE WITHOUT CARDIOVERSION N/A 11/14/2022   Procedure: TRANSESOPHAGEAL ECHOCARDIOGRAM;  Surgeon: Parke Poisson, MD;  Location: [facility] INVASIVE CV LAB;  Service: Cardiovascular;  Laterality: N/A;   TEE WITHOUT CARDIOVERSION N/A 01/30/2023   Procedure: TRANSESOPHAGEAL ECHOCARDIOGRAM (TEE);  Surgeon: Eugenio Hoes, MD;  Location: [facility] OR;   Service: Open Heart Surgery;  Laterality: N/A;     Medications Prior to Admission  Medication Sig Dispense Refill Last Dose/Taking   aspirin 81 MG tablet Take 81 mg by mouth daily.   01/29/2023   B Complex CAPS Take 1 capsule by mouth daily.   Past Week   cholecalciferol (VITAMIN D) 1000 UNITS tablet Take 1,000 Units by mouth daily.   Past Week   FIBER COMPLETE PO Take 2 tablets by mouth daily.   Past Week   fluticasone (FLONASE) 50 MCG/ACT nasal spray Place 2 sprays into both nostrils daily as needed for allergies.   Past Month   loratadine (CLARITIN) 10 MG tablet Take 10 mg by mouth daily as needed for allergies.   Past Month   losartan (COZAAR) 25 MG tablet Take 50 mg by mouth at bedtime.   01/29/2023   NON FORMULARY Pt uses a cpap nightly   Taking   pantoprazole (PROTONIX) 40 MG tablet Take 40 mg by mouth daily.   01/30/2023 at  5:30 AM   sertraline (ZOLOFT) 50 MG tablet Take 50 mg by mouth daily.   01/30/2023 at  5:30 AM   simvastatin (ZOCOR) 20 MG tablet Take 20 mg by mouth at bedtime.   01/29/2023   tamsulosin (FLOMAX) 0.4 MG CAPS capsule Take 0.4 mg by mouth every evening.   01/29/2023    Inpatient Medications:   acetaminophen  1,000 mg Oral Q6H   Or   acetaminophen (TYLENOL) oral liquid 160 mg/5 mL  1,000 mg Per Tube Q6H   acetaminophen (TYLENOL) oral liquid 160 mg/5 mL  650 mg Per Tube Once   aspirin EC  325 mg Oral Daily   Or   aspirin  324 mg Per Tube Daily   bisacodyl  10 mg Oral Daily   Or   bisacodyl  10 mg Rectal Daily   Chlorhexidine Gluconate Cloth  6 each Topical Daily   docusate sodium  200 mg Oral Daily   enoxaparin (LOVENOX) injection  40 mg Subcutaneous QHS   furosemide  40 mg Intravenous Q12H   insulin aspart  0-24 Units Subcutaneous Q4H   metoCLOPramide (REGLAN) injection  10 mg Intravenous Q6H   [START ON 02/01/2023] pantoprazole  40 mg Oral Daily   pantoprazole (PROTONIX) IV  40 mg Intravenous QHS   potassium chloride  20 mEq Oral BID   sertraline  50  mg Oral Daily   simvastatin  20 mg Oral QHS   sodium chloride flush  10 mL Intravenous Q12H   sodium chloride flush  10 mL Intravenous Q12H   sodium chloride flush  3 mL Intravenous Q12H   tamsulosin  0.4 mg Oral QPM    Allergies: No Known Allergies  Family History  Problem Relation Age of Onset   Cancer Mother    Cancer Other 53       PANCREATIC CANCER   Hypertension Other    Sleep apnea Brother        CPAP  Physical Exam: Vitals:   01/31/23 1150 01/31/23 1200 01/31/23 1300 01/31/23 1600  BP:  96/70 (!) 101/59 118/72  Pulse:  69 70 69  Resp:  (!) 21 (!) 22 (!) 22  Temp: 98.6 F (37 C)     TempSrc: Oral     SpO2:  91% 92% 93%  Weight:      Height:        GEN- NAD, A&O x 3, normal affect HEENT: Normocephalic, atraumatic Lungs- CTAB, Normal effort.  Heart- Regular rate and rhythm, No M/G/R. Chest tube to suction. PPM DDD at 70 bpm  GI- Soft, NT, ND.  Extremities- No clubbing, cyanosis, or edema   Radiology/Studies: DG Chest Port 1 View Result Date: 01/31/2023 CLINICAL DATA:  Mitral valve repair EXAM: PORTABLE CHEST 1 VIEW COMPARISON:  01/30/2019 FINDINGS: Interval extubation. Mild decrease in lung volumes following extubation. Swan-Ganz catheter mediastinal chain main. Interval removal of NG tube. Central venous congestion and LEFT effusion unchanged. IMPRESSION: 1. Extubation mild decrease in lung volumes. 2. Central venous congestion and LEFT effusion Electronically Signed   By: Genevive Bi M.D.   On: 01/31/2023 10:08   DG Chest Port 1 View Result Date: 01/30/2023 CLINICAL DATA:  Status post mitral valve repair. EXAM: PORTABLE CHEST 1 VIEW COMPARISON:  January 28, 2023. FINDINGS: Endotracheal and nasogastric tubes are in grossly good position. Right internal jugular Swan-Ganz catheter is noted distal tip in expected position of left pulmonary artery. Mild bibasilar atelectasis or edema is noted with small left pleural effusion. No definite pneumothorax is  noted. Bony thorax is unremarkable. IMPRESSION: Support apparatus as noted above. Mild bibasilar atelectasis or edema is noted with small left pleural effusion. Electronically Signed   By: Lupita Raider M.D.   On: 01/30/2023 14:26   ECHO INTRAOPERATIVE TEE Result Date: 01/30/2023  *INTRAOPERATIVE TRANSESOPHAGEAL REPORT *  Patient Name:   Michael [name] Date of Exam: 01/30/2023 Medical Rec #:  [id number]       Height:       71.0 in Accession #:    [id number]      Weight:       230.0 lb Date of Birth:  1950/05/28       BSA:          2.24 m Patient Age:    72 years        BP:           0/0 mmHg Patient Gender: M               HR:           94 bpm. Exam Location:  Inpatient Transesophogeal exam was perform intraoperatively during surgical procedure. Patient was closely monitored under general anesthesia during the entirety of examination. Indications:     Mitral regurgitation Performing Phys: Mariann Barter MD Diagnosing Phys: Roslynn Amble Complications: No known complications during this procedure. POST-OP IMPRESSIONS _ Left Ventricle: has moderately reduced systolic function, with an ejection fraction of 35-40%. The wall motion is globally hypokinetic without significant regional wall motion abnormalities. _ Right Ventricle: The right ventricle appears unchanged from pre-bypass, with normal systolic function. _ Aorta: The aorta appears unchanged from pre-bypass; there is no dissection present in the aorta. _ Aortic Valve: The aortic valve appears unchanged from pre-bypass. There is trace regurgitation. _ Mitral Valve: Trace residual mitral regurgitation. Mean trans-mitral gradient 2 to . . The mitral valve is status post repair with an annuloplasty ring. _ Tricuspid Valve: The tricuspid valve  appears unchanged from pre-bypass. There is mild regurgitation. _ Pulmonic Valve: The pulmonic valve appears unchanged from pre-bypass. _ Interatrial Septum: The interatrial septum appears unchanged from pre-bypass. _  Pericardium: The pericardium appears unchanged from pre-bypass. PRE-OP FINDINGS  Left Ventricle: The left ventricle has normal systolic function, with an ejection fraction of 55-60%. The cavity size was normal. Right Ventricle: The right ventricle has normal systolic function. The cavity was normal. There is no increase in right ventricular wall thickness. Left Atrium: Left atrial size was not assessed. No left atrial/left atrial appendage thrombus was detected. Right Atrium: Right atrial size was not assessed. Interatrial Septum: No atrial level shunt detected by color flow Doppler. Pericardium: Trivial pericardial effusion is present. There is no pleural effusion. Mitral Valve: Prolapse of the posterior leaflet at approximately the P1/P2 juncture. Mitral valve regurgitation is moderate by color flow Doppler. The MR jet is anteriorly-directed. There is no evidence of mitral stenosis. Tricuspid Valve: The tricuspid valve was normal in structure. Tricuspid valve regurgitation is mild by color flow Doppler. No evidence of tricuspid stenosis is present. Aortic Valve: The aortic valve is normal in structure. Aortic valve regurgitation was not visualized by color flow Doppler. There is no stenosis of the aortic valve. Pulmonic Valve: The pulmonic valve was normal in structure No evidence of pumonic stenosis. Pulmonic valve regurgitation is trivial by color flow Doppler. Aorta: The aortic root, ascending aorta and aortic arch are normal in size and structure.  Roslynn Amble Electronically signed by Roslynn Amble Signature Date/Time: 01/30/2023/1:51:28 PM    Final    DG Chest 2 View Result Date: 01/29/2023 CLINICAL DATA:  Preop chest exam.  Moderate to severe mitral regurg. EXAM: CHEST - 2 VIEW COMPARISON:  CT 07/25/2022 FINDINGS: The heart is normal in size.The cardiomediastinal contours are normal. Minimal chronic bronchial thickening. Pulmonary vasculature is normal. No consolidation, pleural effusion, or pneumothorax.  Thoracic spondylosis. No acute osseous abnormalities are seen. IMPRESSION: No active cardiopulmonary disease. Electronically Signed   By: Narda Rutherford M.D.   On: 01/29/2023 23:05   VAS US CAROTID Result Date: 01/28/2023 Carotid Arterial Duplex Study Patient Name:  Michael [name]  Date of Exam:   01/28/2023 Medical Rec #: [id number]        Accession #:    [id number] Date of Birth: Jul 31, 1950        Patient Gender: M Patient Age:   21 years Exam Location:  [facility] Procedure:      VAS US CAROTID Referring Phys: Eugenio Hoes --------------------------------------------------------------------------------  Indications:       Pre-op MVR. Risk Factors:      Hypertension, hyperlipidemia, past history of smoking. Other Factors:     Mitral valve regurgitation. Comparison Study:  No previous exams Performing Technologist: Jody Hill RVT, RDMS  Examination Guidelines: A complete evaluation includes B-mode imaging, spectral Doppler, color Doppler, and power Doppler as needed of all accessible portions of each vessel. Bilateral testing is considered an integral part of a complete examination. Limited examinations for reoccurring indications may be performed as noted.  Right Carotid Findings: +----------+--------+--------+--------+------------------+------------------+           PSV cm/sEDV cm/sStenosisPlaque DescriptionComments           +----------+--------+--------+--------+------------------+------------------+ CCA Prox  77      14                                intimal thickening +----------+--------+--------+--------+------------------+------------------+ CCA Distal66  14                                intimal thickening +----------+--------+--------+--------+------------------+------------------+ ICA Prox  53      17              calcific and focal                   +----------+--------+--------+--------+------------------+------------------+ ICA Distal64      19                                                    +----------+--------+--------+--------+------------------+------------------+ ECA       71      11                                                   +----------+--------+--------+--------+------------------+------------------+ +----------+--------+-------+----------------+-------------------+           PSV cm/sEDV cmsDescribe        Arm Pressure (mmHG) +----------+--------+-------+----------------+-------------------+ Subclavian117            Multiphasic, WNL                    +----------+--------+-------+----------------+-------------------+ +---------+--------+--+--------+-+---------+ VertebralPSV cm/s36EDV cm/s8Antegrade +---------+--------+--+--------+-+---------+  Left Carotid Findings: +----------+--------+--------+--------+------------------+--------+           PSV cm/sEDV cm/sStenosisPlaque DescriptionComments +----------+--------+--------+--------+------------------+--------+ CCA Prox  92      20                                         +----------+--------+--------+--------+------------------+--------+ CCA Distal78      13                                         +----------+--------+--------+--------+------------------+--------+ ICA Prox  70      22                                         +----------+--------+--------+--------+------------------+--------+ ICA Distal61      18                                         +----------+--------+--------+--------+------------------+--------+ ECA       100     12                                         +----------+--------+--------+--------+------------------+--------+ +----------+--------+--------+----------------+-------------------+           PSV cm/sEDV cm/sDescribe        Arm Pressure (mmHG) +----------+--------+--------+----------------+-------------------+ [id number]             Multiphasic, WNL                     +----------+--------+--------+----------------+-------------------+ +---------+--------+--+--------+--+---------+  VertebralPSV cm/s41EDV cm/s14Antegrade +---------+--------+--+--------+--+---------+   Summary: Right Carotid: The extracranial vessels were near-normal with only minimal wall                thickening or plaque. Left Carotid: The extracranial vessels were near-normal with only minimal wall               thickening or plaque. Vertebrals:  Bilateral vertebral arteries demonstrate antegrade flow. Subclavians: Normal flow hemodynamics were seen in bilateral subclavian              arteries. *See table(s) above for measurements and observations.  Electronically signed by Gerarda Fraction on 01/28/2023 at 6:45:45 PM.    Final     EKG: (personally reviewed) 12/04/22 > SB 43 bpm, LAFB 01/28/23 > SB 43 bpm, LAFB 01/31/23 > junctional bradycardia 38 bpm, narrow QRS   TELEMETRY: APVP 70 bpm, occ PVC's (personally reviewed)  Assessment/Plan:  Junctional Rhythm  Baseline Bradycardia -avoid neosynephrine as can cause reflex bradycardia / AV block > if needs vasopressors favor levophed  -avoid AV nodal blocking agents, BB  -EP Yitzchok Carriger follow for conduction for recovery -amiodarone for VT stopped due to bradycardia   Ventricular Tachycardia PVC's -no further VT since coming off pump  -tele montioring  -follow electrolytes closely, goal K+>4, Mg+>2   Post Op MV Replacement  S/p mitral valve repair with triangular resection of P1 and MV annuloplasty with a 30mm Med[employer] Simulus band  -per TCTS   For questions or updates, please contact [facility] Please consult [email or web address] for contact info under Cardiology/STEMI.  Signed, Canary Brim, MSN, APRN, NP-C, AGACNP-BC [facility] - Electrophysiology  01/31/2023, 4:33 PM  I have seen and examined this patient with Canary Brim.  Agree with above, note added to reflect my findings.  Patient with a past history as above.  He  is postop day 1 from mitral valve repair and PFO closure.  He has a history of significant bradycardia.  Apparently when coming off pump, he had an episode of ventricular tachycardia.  He was started on amiodarone.  He has had no further arrhythmia, though remains atrial and intermittent ventricular paced.  His underlying rhythm appears to be junctional rhythm.  He is feeling well without complaint and is unaware of arrhythmias.  GEN: Well nourished, well developed, in no acute distress  HEENT: normal  Neck: no JVD, carotid bruits, or masses Cardiac: RRR; no murmurs, rubs, or gallops,no edema  Respiratory:  clear to auscultation bilaterally, normal work of breathing GI: soft, nontender, nondistended, + BS MS: no deformity or atrophy  Skin: warm and dry Neuro:  Strength and sensation are intact Psych: euthymic mood, full affect   Junctional bradycardia: Was previously on amiodarone but this is stopped due to bradycardia.  For now, we Therron Sells continue to monitor.  Bradycardia potentially related to recent surgery.  If pacemaker is needed, Idamae Coccia likely plan for early next week. Ventricular tachycardia: Has PVCs.  No further arrhythmias since coming off of pump.  Continue to monitor. Mitral valve repair: Plan per primary team  Francies Inch M. Acheron Sugg MD 01/31/2023 4:37 PM

## 2023-01-31 NOTE — Discharge Summary (Signed)
[address]       [place] [address]             [phone]    Physician Discharge Summary  Patient ID: Michael [name] MRN: [id number] DOB/AGE: [birth year] 72 y.o.  Admit date: 01/30/2023 Discharge date: 02/05/2023  Admission Diagnoses:  Patient Active Problem List   Diagnosis Date Noted   Mitral valve disorder 11/14/2022   Coronary artery calcification seen on CT scan 04/03/2021   Aortic atherosclerosis (HCC) 04/03/2021   Moderate to severe mitral regurgitation 10/06/2020   Obesity (BMI 30.0-34.9) 01/29/2020   Nicotine use disorder 11/09/2015   HTN, goal below 130/80 11/09/2015   Tobacco use disorder 03/31/2014   OSA on CPAP 11/11/2013   Hypertension 06/06/2010   MVP (mitral valve prolapse)    HLD (hyperlipidemia)    Sleep apnea    Discharge Diagnoses:  Patient Active Problem List   Diagnosis Date Noted   S/P mitral valve repair 01/30/2023   Mitral valve disorder 11/14/2022   Coronary artery calcification seen on CT scan 04/03/2021   Aortic atherosclerosis (HCC) 04/03/2021   Moderate to severe mitral regurgitation 10/06/2020   Obesity (BMI 30.0-34.9) 01/29/2020   Nicotine use disorder 11/09/2015   HTN, goal below 130/80 11/09/2015   Tobacco use disorder 03/31/2014   OSA on CPAP 11/11/2013   Hypertension 06/06/2010   MVP (mitral valve prolapse)    HLD (hyperlipidemia)    Sleep apnea    Discharged Condition: stable  History of Present Illness:     Michael [name] is a very pleasant 72 yo male who has had a murmur all his life. He started having echos in his 54s and has been followed closely for MV prolapse. The patient also has bradycardia and is followed by EP and was told at some point he may need PPM. He was recently seen by cardiology and was found on TTE to now have more severe MR. He was relatively asymptomatic but on the last clinic visit started to complain of DOE. He has no lightheadedness nor CP or lower extremity edema. The patient  was felt to best have better evaluation with TEE and was found to have EF of 55-60% and severe MR from P1 flail. He went on to have L and R heart cath with no CAD and minimal Pulm artery pressures.  Dr. Leafy Ro reviewed the patient's diagnostic studies and felt due to his increase in MR and now symptoms to best be served with MV repair. He reviewed the treatment options as well as the risks and benefits of surgery. Michael [name] was agreeable to proceed with surgery.  Hospital Course: Michael [name] [facility] and was brought to the operating room on 01/30/23. He underwent mitral valve repair with triangular resection of P1 and MV annuloplasty with a 30mm [employer] Simulus band. He tolerated the procedure well and was transferred to the SICU in stable condition.  The patient was extubated the evening of surgery.  He was weaned off neo-synephrine as hemodynamics allowed.  He required A. Pacing and his Amiodarone initiated for VT was discontinued.  EP consult was also obtained.  They followed patient and did not feel he would require pacemaker placement.  He was started on lasix to help facilitate diuresis.  The patient's rhythm became junctional in the 60s.  He was not started on any nodal blocking agents.  He was weaned off oxygen as tolerated.  His chest tubes were removed without difficulty.  He developed Atrial Fibrillation with RVR.  He was started on IV amiodarone bolus and drip therapy.  He developed a mild elevation in his creatinine level due to diuretics, these were held.  He converted to NSR and was transitioned to oral Amiodarone.  His creatinine level improved and low dose oral Lasix was resumed. He was a paced but this was not capturing appropriately. EP continued to follow him and felt his epicardial pacing wires could be removed on 12/23 since his junctional bradycardia had resolved. He was started on Eliquis on 12/24. He remained in sinus bradycardia. He was weaned off oxygen  and was ambulating well on RA. His creatinine bumped to 1.56 likely due to diuresis but that seems to be close to his baseline. He developed loose stools, stool softeners were discontinued. This improved with time. His incisions were healing well without sign of infection. He was felt stable for discharge home.   Consults:  critical care, electrophysiology  Significant Diagnostic Studies: cardiac graphics:   Echocardiogram:    IMPRESSIONS    1. Left ventricular ejection fraction, by estimation, is 55 to 60%. The  left ventricle has normal function.   2. Right ventricular systolic function is normal. The right ventricular  size is normal.   3. Left atrial size was severely dilated. No left atrial/left atrial  appendage thrombus was detected.   4. Eccentric anteriorly directed severe mitral valve regurgitation,  prolapse of P1 scallop with flail segment. Systolic flow reversal in the  right and left upper pulmonary veins. The mitral valve is abnormal. Severe  mitral valve regurgitation. No  evidence of mitral stenosis.   5. The aortic valve is tricuspid. Aortic valve regurgitation is trivial.  No aortic stenosis is present.   6. There is mild (Grade II) plaque involving the aortic arch and  descending aorta.   7. Agitated saline contrast bubble study was positive with shunting  observed within 3-6 cardiac cycles suggestive of interatrial shunt. There  is a small patent foramen ovale with bidirectional shunting across atrial  septum.   Treatments: surgery:   01/30/2023 [name] [id number]   Surgeon:  Ashley Akin, MD   First Assistant: Jillyn Hidden [facility]                               An experienced assistant was required given the complexity of this surgery and the standard of surgical care. The assistant was needed for exposure, dissection, suctioning, retraction of delicate tissues and sutures, instrument exchange and for overall help during this procedure.        Preoperative Diagnosis:  Severe Mitral Regurgitation secondary to flail P1 segment                                              Patent foreman ovale     Postoperative Diagnosis:  Same   Procedure: Complex mitral valve repair with triangular resection of P1 with placement of a 30mm Simulus semi ridig band Closure of PFO   Discharge Exam: Blood pressure (!) 144/81, pulse 62, temperature 98.1 F (36.7 C), temperature source Oral, resp. rate 20, height 5\' 11"  (1.803 m), weight 100.9 kg, SpO2 93%. General appearance: alert, cooperative, and no distress Neurologic: intact Heart: sinus bradycardia Lungs: slightly diminished bibasilar breath sounds Abdomen: soft, non-tender; bowel sounds  normal; no masses,  no organomegaly Extremities: extremities normal, atraumatic, no cyanosis or edema Wound: Clean and dry without sign of infection  Discharge Medications:  The patient has been discharged on:   1.Beta Blocker:  Yes [   ]                              No   [  X ]                              If No, reason: bradycardia  2.Ace Inhibitor/ARB: Yes [   ]                                     No  [  X  ]                                     If No, reason: AKI  3.Statin:   Yes [ X  ]                  No  [   ]                  If No, reason:  4.Ecasa:  Yes  [ X  ]                  No   [   ]                  If No, reason:  Patient had ACS upon admission: No  Plavix/P2Y12 inhibitor: Yes [   ]                                      No  [ X ]  Discharge Instructions     Amb Referral to Cardiac Rehabilitation   Complete by: As directed    Diagnosis: Valve Repair   Valve: Mitral   After initial evaluation and assessments completed: Virtual Based Care may be provided alone or in conjunction with Phase 2 Cardiac Rehab based on patient barriers.: Yes   Intensive Cardiac Rehabilitation (ICR) [facility] location only OR Traditional Cardiac Rehabilitation (TCR) *If criteria for ICR are not met will  enroll in TCR [facility] only): Yes      Allergies as of 02/05/2023   No Known Allergies      Medication List     STOP taking these medications    losartan 25 MG tablet Commonly known as: COZAAR       TAKE these medications    acetaminophen 325 MG tablet Commonly known as: Tylenol Take 2 tablets (650 mg total) by mouth every 6 (six) hours as needed.   amiodarone 200 MG tablet Commonly known as: PACERONE Take 2 tabs twice per day for 7 days, then take 1 tab twice per day for 7 days, then take 1 tab daily thereafter   apixaban 5 MG Tabs tablet Commonly known as: ELIQUIS Take 1 tablet (5 mg total) by mouth 2 (two) times daily.   aspirin 81 MG tablet Take 81 mg by mouth daily.   B Complex Caps Take 1  capsule by mouth daily.   cholecalciferol 1000 units tablet Commonly known as: VITAMIN D Take 1,000 Units by mouth daily.   FIBER COMPLETE PO Take 2 tablets by mouth daily.   fluticasone 50 MCG/ACT nasal spray Commonly known as: FLONASE Place 2 sprays into both nostrils daily as needed for allergies.   loratadine 10 MG tablet Commonly known as: CLARITIN Take 10 mg by mouth daily as needed for allergies.   NON FORMULARY Pt uses a cpap nightly   pantoprazole 40 MG tablet Commonly known as: PROTONIX Take 40 mg by mouth daily.   sertraline 50 MG tablet Commonly known as: ZOLOFT Take 50 mg by mouth daily.   simvastatin 20 MG tablet Commonly known as: ZOCOR Take 20 mg by mouth at bedtime.   tamsulosin 0.4 MG Caps capsule Commonly known as: FLOMAX Take 0.4 mg by mouth every evening.   traMADol 50 MG tablet Commonly known as: ULTRAM Take 1 tablet (50 mg total) by mouth every 6 (six) hours as needed for moderate pain (pain score 4-6).        Follow-up Information     [facility] Follow up on 02/21/2023.   Specialty: Cardiothoracic Surgery Why: Appointment is at 3:30 Contact information: [address] [place],  [address] [place] [address] [phone]        [facility] Follow up on 02/21/2023.   Why: Please get CXR at 2:30 prior to your appointment with Dr. Karolee Ohs office Contact information: [address] [place] [address]        Nahser, Deloris Ping, MD. Go on 03/06/2023.   Specialty: Cardiology Why: Your appointment is at 9:20 am. Contact information: [address] [place] [address] [phone]         [facility] Follow up.   Specialty: Cardiology Why: Post-op Echo scheduled for 03/13/2023 at 11:15am. Please arrive 15 mintues early for check-in. Contact information: [address] [place] [address] [phone]                Signed:  Jenny Reichmann, PA-C  02/05/2023, 9:45 AM

## 2023-01-31 NOTE — Progress Notes (Deleted)
ELECTROPHYSIOLOGY CONSULT NOTE    Patient ID: Michael GUTRIDGE MRN: [id number], DOB/AGE: 17-Aug-1950 72 y.o.  Admit date: 01/30/2023 Date of Consult: 01/31/2023  Primary Physician: [name], MD Primary Cardiologist: Kristeen Miss, MD  Electrophysiologist: Dr. Lalla Brothers   Referring Provider: Dr. Leafy Ro  Patient Profile: Michael [name] is a 72 y.o. male with a history of bradycardia, HLD, MV prolapse/severe mitral valve regurgitation, OSA, tobacco abuse, BPH, and obesity who is being seen today for the evaluation of VT & bradycardia at the request of Dr. Nevin Bloodgood.  HPI:  Michael [name] is a 72 y.o. male admitted 12/18 for planned mitral valve repair and closure of PFO.  Previously seen by Dr. Lalla Brothers in 2022 for bradycardia in the setting of beta-blocker.  At that time he was experiencing heart rates in the 40s on nebivolol.  This was stopped and BP heart rate remained stable with heart rates in the 50s.  He had appropriate chronotropic response with ambulation in the clinic.  At that time it was felt there was no indication for permanent pacemaker.  On 12/18, the patient underwent complex mitral valve repair and closure of PFO.  Intraoperative TEE showed LVEF of 35-40% with global hypokinesis, suspected LV stunning after coming off pump.  Patient reportedly had VT when coming off pump.  He was started on amiodarone.  He returned to the ICU postoperatively intubated & was subsequently extubated without event.  He continued to require Neo-Synephrine at low dose postoperatively.  Patient A-paced in the postop period with underlying slow junctional rhythm with ventricular ectopy.  He denies chest pain, palpitations, dyspnea, PND, orthopnea, nausea, vomiting, dizziness, syncope, edema, weight gain, or early satiety.   Labs Potassium4.2 (12/19 3295) Magnesium  2.4 (12/19 0356) Creatinine, ser  1.30* (12/19 0356) PLT  191 (12/19 0356) HGB  13.1 (12/19 0356) WBC 21.8* (12/19 0356)  .     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 and moderate to severe mitral regurgitation   MVP (mitral valve prolapse)    with moderate MR per echo in 2009   Normal cardiac stress test 02/2012   OSA on CPAP 11/11/2013   Sleep apnea    wears CPAP   Tinnitus    Tobacco abuse    Tobacco use disorder 03/31/2014   White coat hypertension      Surgical History:  Past Surgical History:  Procedure Laterality Date   APPENDECTOMY  02/13/2004   COLONOSCOPY     INGUINAL HERNIA REPAIR Right    MITRAL VALVE REPAIR N/A 01/30/2023   Procedure: MITRAL VALVE REPAIR (MVR) UTILIZING SIMULUS SEMI-RIGID ANNULOPLASTY BAND SIZE ;  Surgeon: Eugenio Hoes, MD;  Location: [facility] OR;  Service: Open Heart Surgery;  Laterality: N/A;   RIGHT/LEFT HEART CATH AND CORONARY ANGIOGRAPHY N/A 12/10/2022   Procedure:  RIGHT/LEFT HEART CATH AND CORONARY ANGIOGRAPHY;  Surgeon: Orbie Pyo, MD;  Location: [facility] INVASIVE CV LAB;  Service: Cardiovascular;  Laterality: N/A;   ROTATOR CUFF REPAIR Left 02/12/2005   TEE WITHOUT CARDIOVERSION N/A 11/14/2022   Procedure: TRANSESOPHAGEAL ECHOCARDIOGRAM;  Surgeon: Parke Poisson, MD;  Location: [facility] INVASIVE CV LAB;  Service: Cardiovascular;  Laterality: N/A;   TEE WITHOUT CARDIOVERSION N/A 01/30/2023   Procedure: TRANSESOPHAGEAL ECHOCARDIOGRAM (TEE);  Surgeon: Eugenio Hoes, MD;  Location: [facility] OR;   Service: Open Heart Surgery;  Laterality: N/A;     Medications Prior to Admission  Medication Sig Dispense Refill Last Dose/Taking   aspirin 81 MG tablet Take 81 mg by mouth daily.   01/29/2023   B Complex CAPS Take 1 capsule by mouth daily.   Past Week   cholecalciferol (VITAMIN D) 1000 UNITS tablet Take 1,000 Units by mouth daily.   Past Week   FIBER COMPLETE PO Take 2 tablets by mouth daily.   Past Week   fluticasone (FLONASE) 50 MCG/ACT nasal spray Place 2 sprays into both nostrils daily as needed for allergies.   Past Month   loratadine (CLARITIN) 10 MG tablet Take 10 mg by mouth daily as needed for allergies.   Past Month   losartan (COZAAR) 25 MG tablet Take 50 mg by mouth at bedtime.   01/29/2023   NON FORMULARY Pt uses a cpap nightly   Taking   pantoprazole (PROTONIX) 40 MG tablet Take 40 mg by mouth daily.   01/30/2023 at  5:30 AM   sertraline (ZOLOFT) 50 MG tablet Take 50 mg by mouth daily.   01/30/2023 at  5:30 AM   simvastatin (ZOCOR) 20 MG tablet Take 20 mg by mouth at bedtime.   01/29/2023   tamsulosin (FLOMAX) 0.4 MG CAPS capsule Take 0.4 mg by mouth every evening.   01/29/2023    Inpatient Medications:   acetaminophen  1,000 mg Oral Q6H   Or   acetaminophen (TYLENOL) oral liquid 160 mg/5 mL  1,000 mg Per Tube Q6H   acetaminophen (TYLENOL) oral liquid 160 mg/5 mL  650 mg Per Tube Once   aspirin EC  325 mg Oral Daily   Or   aspirin  324 mg Per Tube Daily   bisacodyl  10 mg Oral Daily   Or   bisacodyl  10 mg Rectal Daily   Chlorhexidine Gluconate Cloth  6 each Topical Daily   docusate sodium  200 mg Oral Daily   enoxaparin (LOVENOX) injection  40 mg Subcutaneous QHS   furosemide  40 mg Intravenous Q12H   insulin aspart  0-24 Units Subcutaneous Q4H   metoCLOPramide (REGLAN) injection  10 mg Intravenous Q6H   [START ON 02/01/2023] pantoprazole  40 mg Oral Daily   pantoprazole (PROTONIX) IV  40 mg Intravenous QHS   potassium chloride  20 mEq Oral BID   sertraline  50  mg Oral Daily   simvastatin  20 mg Oral QHS   sodium chloride flush  10 mL Intravenous Q12H   sodium chloride flush  10 mL Intravenous Q12H   sodium chloride flush  3 mL Intravenous Q12H   tamsulosin  0.4 mg Oral QPM    Allergies: No Known Allergies  Family History  Problem Relation Age of Onset   Cancer Mother    Cancer Other 27       PANCREATIC CANCER   Hypertension Other    Sleep apnea Brother        CPAP  Physical Exam: Vitals:   01/31/23 1100 01/31/23 1150 01/31/23 1200 01/31/23 1300  BP: 99/63  96/70 (!) 101/59  Pulse: 69  69 70  Resp: (!) 27  (!) 21 (!) 22  Temp:  98.6 F (37 C)    TempSrc:  Oral    SpO2: 93%  91% 92%  Weight:      Height:        GEN- NAD, A&O x 3, normal affect HEENT: Normocephalic, atraumatic Lungs- CTAB, Normal effort.  Heart- Regular rate and rhythm, No M/G/R. Chest tube to suction. PPM DDD at 70 bpm  GI- Soft, NT, ND.  Extremities- No clubbing, cyanosis, or edema   Radiology/Studies: DG Chest Port 1 View Result Date: 01/31/2023 CLINICAL DATA:  Mitral valve repair EXAM: PORTABLE CHEST 1 VIEW COMPARISON:  01/30/2019 FINDINGS: Interval extubation. Mild decrease in lung volumes following extubation. Swan-Ganz catheter mediastinal chain main. Interval removal of NG tube. Central venous congestion and LEFT effusion unchanged. IMPRESSION: 1. Extubation mild decrease in lung volumes. 2. Central venous congestion and LEFT effusion Electronically Signed   By: Genevive Bi M.D.   On: 01/31/2023 10:08   DG Chest Port 1 View Result Date: 01/30/2023 CLINICAL DATA:  Status post mitral valve repair. EXAM: PORTABLE CHEST 1 VIEW COMPARISON:  January 28, 2023. FINDINGS: Endotracheal and nasogastric tubes are in grossly good position. Right internal jugular Swan-Ganz catheter is noted distal tip in expected position of left pulmonary artery. Mild bibasilar atelectasis or edema is noted with small left pleural effusion. No definite pneumothorax is noted.  Bony thorax is unremarkable. IMPRESSION: Support apparatus as noted above. Mild bibasilar atelectasis or edema is noted with small left pleural effusion. Electronically Signed   By: Lupita Raider M.D.   On: 01/30/2023 14:26   ECHO INTRAOPERATIVE TEE Result Date: 01/30/2023  *INTRAOPERATIVE TRANSESOPHAGEAL REPORT *  Patient Name:   Michael [name] Date of Exam: 01/30/2023 Medical Rec #:  [id number]       Height:       71.0 in Accession #:    [id number]      Weight:       230.0 lb Date of Birth:  Jan 17, 1951       BSA:          2.24 m Patient Age:    72 years        BP:           0/0 mmHg Patient Gender: M               HR:           94 bpm. Exam Location:  Inpatient Transesophogeal exam was perform intraoperatively during surgical procedure. Patient was closely monitored under general anesthesia during the entirety of examination. Indications:     Mitral regurgitation Performing Phys: Mariann Barter MD Diagnosing Phys: Roslynn Amble Complications: No known complications during this procedure. POST-OP IMPRESSIONS _ Left Ventricle: has moderately reduced systolic function, with an ejection fraction of 35-40%. The wall motion is globally hypokinetic without significant regional wall motion abnormalities. _ Right Ventricle: The right ventricle appears unchanged from pre-bypass, with normal systolic function. _ Aorta: The aorta appears unchanged from pre-bypass; there is no dissection present in the aorta. _ Aortic Valve: The aortic valve appears unchanged from pre-bypass. There is trace regurgitation. _ Mitral Valve: Trace residual mitral regurgitation. Mean trans-mitral gradient 2 to . . The mitral valve is status post repair with an annuloplasty ring. _ Tricuspid Valve: The tricuspid valve  appears unchanged from pre-bypass. There is mild regurgitation. _ Pulmonic Valve: The pulmonic valve appears unchanged from pre-bypass. _ Interatrial Septum: The interatrial septum appears unchanged from pre-bypass. _  Pericardium: The pericardium appears unchanged from pre-bypass. PRE-OP FINDINGS  Left Ventricle: The left ventricle has normal systolic function, with an ejection fraction of 55-60%. The cavity size was normal. Right Ventricle: The right ventricle has normal systolic function. The cavity was normal. There is no increase in right ventricular wall thickness. Left Atrium: Left atrial size was not assessed. No left atrial/left atrial appendage thrombus was detected. Right Atrium: Right atrial size was not assessed. Interatrial Septum: No atrial level shunt detected by color flow Doppler. Pericardium: Trivial pericardial effusion is present. There is no pleural effusion. Mitral Valve: Prolapse of the posterior leaflet at approximately the P1/P2 juncture. Mitral valve regurgitation is moderate by color flow Doppler. The MR jet is anteriorly-directed. There is no evidence of mitral stenosis. Tricuspid Valve: The tricuspid valve was normal in structure. Tricuspid valve regurgitation is mild by color flow Doppler. No evidence of tricuspid stenosis is present. Aortic Valve: The aortic valve is normal in structure. Aortic valve regurgitation was not visualized by color flow Doppler. There is no stenosis of the aortic valve. Pulmonic Valve: The pulmonic valve was normal in structure No evidence of pumonic stenosis. Pulmonic valve regurgitation is trivial by color flow Doppler. Aorta: The aortic root, ascending aorta and aortic arch are normal in size and structure.  Roslynn Amble Electronically signed by Roslynn Amble Signature Date/Time: 01/30/2023/1:51:28 PM    Final    DG Chest 2 View Result Date: 01/29/2023 CLINICAL DATA:  Preop chest exam.  Moderate to severe mitral regurg. EXAM: CHEST - 2 VIEW COMPARISON:  CT 07/25/2022 FINDINGS: The heart is normal in size.The cardiomediastinal contours are normal. Minimal chronic bronchial thickening. Pulmonary vasculature is normal. No consolidation, pleural effusion, or pneumothorax.  Thoracic spondylosis. No acute osseous abnormalities are seen. IMPRESSION: No active cardiopulmonary disease. Electronically Signed   By: Narda Rutherford M.D.   On: 01/29/2023 23:05   VAS US CAROTID Result Date: 01/28/2023 Carotid Arterial Duplex Study Patient Name:  Michael [name]  Date of Exam:   01/28/2023 Medical Rec #: [id number]        Accession #:    [id number] Date of Birth: Jul 15, 1950        Patient Gender: M Patient Age:   32 years Exam Location:  [facility] Procedure:      VAS US CAROTID Referring Phys: Eugenio Hoes --------------------------------------------------------------------------------  Indications:       Pre-op MVR. Risk Factors:      Hypertension, hyperlipidemia, past history of smoking. Other Factors:     Mitral valve regurgitation. Comparison Study:  No previous exams Performing Technologist: Jody Hill RVT, RDMS  Examination Guidelines: A complete evaluation includes B-mode imaging, spectral Doppler, color Doppler, and power Doppler as needed of all accessible portions of each vessel. Bilateral testing is considered an integral part of a complete examination. Limited examinations for reoccurring indications may be performed as noted.  Right Carotid Findings: +----------+--------+--------+--------+------------------+------------------+           PSV cm/sEDV cm/sStenosisPlaque DescriptionComments           +----------+--------+--------+--------+------------------+------------------+ CCA Prox  77      14                                intimal thickening +----------+--------+--------+--------+------------------+------------------+ CCA Distal66  14                                intimal thickening +----------+--------+--------+--------+------------------+------------------+ ICA Prox  53      17              calcific and focal                   +----------+--------+--------+--------+------------------+------------------+ ICA Distal64      19                                                    +----------+--------+--------+--------+------------------+------------------+ ECA       71      11                                                   +----------+--------+--------+--------+------------------+------------------+ +----------+--------+-------+----------------+-------------------+           PSV cm/sEDV cmsDescribe        Arm Pressure (mmHG) +----------+--------+-------+----------------+-------------------+ Subclavian117            Multiphasic, WNL                    +----------+--------+-------+----------------+-------------------+ +---------+--------+--+--------+-+---------+ VertebralPSV cm/s36EDV cm/s8Antegrade +---------+--------+--+--------+-+---------+  Left Carotid Findings: +----------+--------+--------+--------+------------------+--------+           PSV cm/sEDV cm/sStenosisPlaque DescriptionComments +----------+--------+--------+--------+------------------+--------+ CCA Prox  92      20                                         +----------+--------+--------+--------+------------------+--------+ CCA Distal78      13                                         +----------+--------+--------+--------+------------------+--------+ ICA Prox  70      22                                         +----------+--------+--------+--------+------------------+--------+ ICA Distal61      18                                         +----------+--------+--------+--------+------------------+--------+ ECA       100     12                                         +----------+--------+--------+--------+------------------+--------+ +----------+--------+--------+----------------+-------------------+           PSV cm/sEDV cm/sDescribe        Arm Pressure (mmHG) +----------+--------+--------+----------------+-------------------+ [id number]             Multiphasic, WNL                     +----------+--------+--------+----------------+-------------------+ +---------+--------+--+--------+--+---------+  VertebralPSV cm/s41EDV cm/s14Antegrade +---------+--------+--+--------+--+---------+   Summary: Right Carotid: The extracranial vessels were near-normal with only minimal wall                thickening or plaque. Left Carotid: The extracranial vessels were near-normal with only minimal wall               thickening or plaque. Vertebrals:  Bilateral vertebral arteries demonstrate antegrade flow. Subclavians: Normal flow hemodynamics were seen in bilateral subclavian              arteries. *See table(s) above for measurements and observations.  Electronically signed by Gerarda Fraction on 01/28/2023 at 6:45:45 PM.    Final     EKG: (personally reviewed) 12/04/22 > SB 43 bpm, LAFB 01/28/23 > SB 43 bpm, LAFB 01/31/23 > junctional bradycardia 38 bpm, narrow QRS   TELEMETRY: APVP 70 bpm, occ PVC's (personally reviewed)  Assessment/Plan:  Junctional Rhythm  Baseline Bradycardia -avoid neosynephrine as can cause reflex bradycardia / AV block > if needs vasopressors favor levophed  -avoid AV nodal blocking agents, BB  -EP will follow for conduction for recovery -amiodarone for VT stopped due to bradycardia   Ventricular Tachycardia PVC's -no further VT since coming off pump  -tele montioring  -follow electrolytes closely, goal K+>4, Mg+>2   Post Op MV Replacement  S/p mitral valve repair with triangular resection of P1 and MV annuloplasty with a 30mm [employer] Simulus band  -per TCTS   For questions or updates, please contact [facility] Please consult [email or web address] for contact info under Cardiology/STEMI.  Signed, Canary Brim, MSN, APRN, NP-C, AGACNP-BC [facility] - Electrophysiology  01/31/2023, 2:59 PM

## 2023-02-01 ENCOUNTER — Inpatient Hospital Stay (HOSPITAL_COMMUNITY): Payer: Medicare Other

## 2023-02-01 DIAGNOSIS — Z87891 Personal history of nicotine dependence: Secondary | ICD-10-CM

## 2023-02-01 DIAGNOSIS — Z4889 Encounter for other specified surgical aftercare: Secondary | ICD-10-CM

## 2023-02-01 LAB — GLUCOSE, CAPILLARY
Glucose-Capillary: 100 mg/dL — ABNORMAL HIGH (ref 70–99)
Glucose-Capillary: 106 mg/dL — ABNORMAL HIGH (ref 70–99)
Glucose-Capillary: 91 mg/dL (ref 70–99)
Glucose-Capillary: 93 mg/dL (ref 70–99)
Glucose-Capillary: 96 mg/dL (ref 70–99)

## 2023-02-01 LAB — BASIC METABOLIC PANEL
Anion gap: 10 (ref 5–15)
BUN: 24 mg/dL — ABNORMAL HIGH (ref 8–23)
CO2: 22 mmol/L (ref 22–32)
Calcium: 7.9 mg/dL — ABNORMAL LOW (ref 8.9–10.3)
Chloride: 102 mmol/L (ref 98–111)
Creatinine, Ser: 1.67 mg/dL — ABNORMAL HIGH (ref 0.61–1.24)
GFR, Estimated: 43 mL/min — ABNORMAL LOW (ref >60)
Glucose, Bld: 119 mg/dL — ABNORMAL HIGH (ref 70–99)
Potassium: 4.5 mmol/L (ref 3.5–5.1)
Sodium: 134 mmol/L — ABNORMAL LOW (ref 135–145)

## 2023-02-01 LAB — CBC
HCT: 37.3 % — ABNORMAL LOW (ref 39.0–52.0)
Hemoglobin: 12.5 g/dL — ABNORMAL LOW (ref 13.0–17.0)
MCH: 31.6 pg (ref 26.0–34.0)
MCHC: 33.5 g/dL (ref 30.0–36.0)
MCV: 94.4 fL (ref 80.0–100.0)
Platelets: 151 10*3/uL (ref 150–400)
RBC: 3.95 MIL/uL — ABNORMAL LOW (ref 4.22–5.81)
RDW: 13.9 % (ref 11.5–15.5)
WBC: 19.6 10*3/uL — ABNORMAL HIGH (ref 4.0–10.5)
nRBC: 0 % (ref 0.0–0.2)

## 2023-02-01 LAB — SURGICAL PATHOLOGY

## 2023-02-01 NOTE — Progress Notes (Signed)
NAME:  Michael [name], MRN:  [id number], DOB:  01-Jan-1951, LOS: 2 ADMISSION DATE:  01/30/2023, CONSULTATION DATE:  12/18 REFERRING MD:  Leafy Ro, CHIEF COMPLAINT:  s/p MVR   History of Present Illness:  72 year old male with past medical history of CAD, hiatal hernia, severe mitral regurgitation, MVP, OSA on CPAP, tobacco use, hyperlipidemia presents for mitral valve repair. He had lifelong murmur evaluated showing severe mitral regurgitation.   OR 12/18 with Dr. Leafy Ro and had complex mitral valve repair and closure of PFO. Intraoperative TEE showed EF 35-40%, global hypokinesis of LV. RV okay.   Pump time: 1hr 41m Xclamp time: 1hr 82m EBL: 1355cc Cell Saver: 680cc  Transferred to ICU post-operative. PCCM consulted for post-op vent management, medical management.   Pertinent  Medical History  CAD, hiatal hernia, HLD, severe mitral regurg, MVP, OSA on CPAP, tobacco use   Significant Hospital Events: Including procedures, antibiotic start and stop dates in addition to other pertinent events   12/18: s/p MVR, PFO closure, extubated  12/19: extubated yesterday without incident. No significant CT output. Still a little vasoplegic on neo 20. A-paced. Stop amiodarone. Underlying rhythm junctional. Starting lasix   Interim History / Subjective:  Denies any complaints Ambulated in hall earlier  Objective   Blood pressure (!) 101/48, pulse (!) 58, temperature 98.9 F (37.2 C), temperature source Oral, resp. rate 20, height 5\' 11"  (1.803 m), weight 108.2 kg, SpO2 91%.        Intake/Output Summary (Last 24 hours) at 02/01/2023 0955 Last data filed at 02/01/2023 0800 Gross per 24 hour  Intake 687.02 ml  Output 2120 ml  Net -1432.98 ml   Filed Weights   01/30/23 0700 01/31/23 0545 02/01/23 0500  Weight: 104.3 kg 109.5 kg 108.2 kg    Examination: General:  older male sitting upright in bed in NAD HEENT: MM pink/moist Neuro: Alert, oriented, MAE CV: rr, NSR 60, sternal dressing  dry, pacing wires in place, VVI PULM:  non labored, few scattered crackles LUL, diminished in bases, getting on IS, on 2-3L GI: soft, bs+, NT, foley - cyu Extremities: warm/dry, trace LE edema  Skin: no rashes or lesions  Labs reviewed, sCr 1.67, WBC 22> 1.96 UOP 1.8L/ 24hrs Net +2.6L Afebrile CXR with atelectasis, possible small pleural effusions  Resolved Hospital Problem list     Assessment & Plan:  Severe mitral regurgitation due to MVP s/p complex mitral valve repair 12/18 Patent foramen ovale status post closure Bradycardia AKI  - evaluated by EP, cont to hold amio and BB.  HR remains stable at this time.  Avoid Neo for reflex bradycardia if needed.  Pacing wires per TCTS - remains hemodynamically stable - ASA, statin - cont diuresis as renally/ hemodynamically tolerated - post op per TCTS, to monitor another day in ICU - multi-modal pain management - WBC trending down, remains afebrile, clinically monitor - monitor renal function/ trend UOP closely.  Avoid nephrotoxins, support hemodynamics, renal dose meds  Acute respiratory insufficiency, postop; extubated 12/18 without incident  Tobacco use, 20 pack year hx, quit 2014  OSA on CPAP - wean supplemental O2 for sat >92%  - cont aggressive pulmonary hygiene, ambulate/ IS - CPAP at bedtime - CXR prn - diurese   Hyperlipidemia - statin   Obesity - diet, exercise counseling   Best Practice (right click and "Reselect all SmartList Selections" daily)   Diet/type: Regular consistency (see orders) DVT prophylaxis: SCD, lovenox Pressure ulcer(s): na GI prophylaxis: PPI Lines: Central line- R internal  jugular cordis remains Foley:  Yes, and it is still needed> likely d/c soon Code Status:  full code Last date of multidisciplinary goals of care discussion [per primary]  Labs   CBC: Recent Labs  Lab 01/30/23 1300 01/30/23 1302 01/30/23 1900 01/30/23 1904 01/31/23 0356 01/31/23 1646 02/01/23 0356  WBC  21.6*  --   --  21.3* 21.8* 22.0* 19.6*  HGB 14.2   < > 15.0 15.1 13.1 12.6* 12.5*  HCT 42.2   < > 44.0 44.9 39.4 38.1* 37.3*  MCV 94.4  --   --  94.1 94.9 96.0 94.4  PLT 177  --   --  213 191 151 151   < > = values in this interval not displayed.    Basic Metabolic Panel: Recent Labs  Lab 01/28/23 1028 01/30/23 0859 01/30/23 1150 01/30/23 1302 01/30/23 1900 01/30/23 1904 01/31/23 0356 01/31/23 1646 02/01/23 0356  NA 142   < > 140   < > 140 140 136 135 134*  K 3.9   < > 4.6   < > 4.8 4.8 4.2 4.7 4.5  CL 109   < > 105  --   --  109 109 102 102  CO2 25  --   --   --   --  21* 21* 22 22  GLUCOSE 94   < > 140*  --   --  129* 121* 103* 119*  BUN 14   < > 14  --   --  15 17 20  24*  CREATININE 1.48*   < > 1.10  --   --  1.63* 1.30* 1.58* 1.67*  CALCIUM 9.2  --   --   --   --  8.0* 7.4* 7.8* 7.9*  MG  --   --   --   --   --  2.7* 2.4 2.4  --    < > = values in this interval not displayed.   GFR: Estimated Creatinine Clearance: 49.3 mL/min (A) (by C-G formula based on SCr of 1.67 mg/dL (H)). Recent Labs  Lab 01/30/23 1904 01/31/23 0356 01/31/23 1646 02/01/23 0356  WBC 21.3* 21.8* 22.0* 19.6*    Liver Function Tests: Recent Labs  Lab 01/28/23 1028  AST 25  ALT 22  ALKPHOS 87  BILITOT 0.8  PROT 6.6  ALBUMIN 3.7   No results for input(s): "LIPASE", "AMYLASE" in the last 168 hours. No results for input(s): "AMMONIA" in the last 168 hours.  ABG    Component Value Date/Time   PHART 7.317 (L) 01/30/2023 1900   PCO2ART 47.4 01/30/2023 1900   PO2ART 77 (L) 01/30/2023 1900   HCO3 24.3 01/30/2023 1900   TCO2 26 01/30/2023 1900   ACIDBASEDEF 2.0 01/30/2023 1900   O2SAT 94 01/30/2023 1900     Coagulation Profile: Recent Labs  Lab 01/28/23 1028 01/30/23 1300  INR 1.1 1.3*    Cardiac Enzymes: No results for input(s): "CKTOTAL", "CKMB", "CKMBINDEX", "TROPONINI" in the last 168 hours.  HbA1C: Hgb A1c MFr Bld  Date/Time Value Ref Range Status  01/28/2023 10:28  AM 5.3 4.8 - 5.6 % Final    Comment:    (NOTE) Pre diabetes:          5.7%-6.4%  Diabetes:              >6.4%  Glycemic control for   <7.0% adults with diabetes     CBG: Recent Labs  Lab 01/31/23 1113 01/31/23 1611 01/31/23 1958 01/31/23 2313 02/01/23 0319  GLUCAP 103* 104* 94 114* 93    Past Medical History:  He,  has a past medical history of Anal fissure, Anxiety, Aortic atherosclerosis (HCC) (04/03/2021), Cancer (HCC) (2022), Convulsions (HCC), Coronary artery calcification seen on CT scan (04/03/2021), Diverticulitis, Heart murmur, Hiatal hernia with gastroesophageal reflux, History of kidney stones, HLD (hyperlipidemia), Moderate to severe mitral regurgitation (10/06/2020), MVP (mitral valve prolapse), Normal cardiac stress test (02/2012), OSA on CPAP (11/11/2013), Sleep apnea, Tinnitus, Tobacco abuse, Tobacco use disorder (03/31/2014), and White coat hypertension.   Surgical History:   Past Surgical History:  Procedure Laterality Date   APPENDECTOMY  02/13/2004   COLONOSCOPY     INGUINAL HERNIA REPAIR Right    MITRAL VALVE REPAIR N/A 01/30/2023   Procedure: MITRAL VALVE REPAIR (MVR) UTILIZING SIMULUS SEMI-RIGID ANNULOPLASTY BAND SIZE ;  Surgeon: Eugenio Hoes, MD;  Location: [facility] OR;  Service: Open Heart Surgery;  Laterality: N/A;   RIGHT/LEFT HEART CATH AND CORONARY ANGIOGRAPHY N/A 12/10/2022   Procedure: RIGHT/LEFT HEART CATH AND CORONARY ANGIOGRAPHY;  Surgeon: Orbie Pyo, MD;  Location: [facility] INVASIVE CV LAB;  Service: Cardiovascular;  Laterality: N/A;   ROTATOR CUFF REPAIR Left 02/12/2005   TEE WITHOUT CARDIOVERSION N/A 11/14/2022   Procedure: TRANSESOPHAGEAL ECHOCARDIOGRAM;  Surgeon: Parke Poisson, MD;  Location: [facility] INVASIVE CV LAB;  Service: Cardiovascular;  Laterality: N/A;   TEE WITHOUT CARDIOVERSION N/A 01/30/2023   Procedure: TRANSESOPHAGEAL ECHOCARDIOGRAM (TEE);  Surgeon: Eugenio Hoes, MD;  Location: [facility] OR;  Service: Open Heart Surgery;   Laterality: N/A;     Social History:   reports that he quit smoking about 10 years ago. His smoking use included cigarettes. He started smoking about 55 years ago. He has a 22.5 pack-year smoking history. He has never used smokeless tobacco. He reports that he does not drink alcohol and does not use drugs.   Family History:  His family history includes Cancer in his mother; Cancer (age of onset: 81) in an other family member; Hypertension in an other family member; Sleep apnea in his brother.   Allergies No Known Allergies   Home Medications  Prior to Admission medications   Medication Sig Start Date End Date Taking? Authorizing Provider  aspirin 81 MG tablet Take 81 mg by mouth daily.   Yes [provider]  B Complex CAPS Take 1 capsule by mouth daily.   Yes [provider]  cholecalciferol (VITAMIN D) 1000 UNITS tablet Take 1,000 Units by mouth daily.   Yes [provider]  FIBER COMPLETE PO Take 2 tablets by mouth daily.   Yes [provider]  fluticasone (FLONASE) 50 MCG/ACT nasal spray Place 2 sprays into both nostrils daily as needed for allergies. 10/16/11  Yes [provider]  loratadine (CLARITIN) 10 MG tablet Take 10 mg by mouth daily as needed for allergies.   Yes [provider]  losartan (COZAAR) 25 MG tablet Take 50 mg by mouth at bedtime. 09/15/18  Yes [provider]  NON FORMULARY Pt uses a cpap nightly   Yes [provider]  pantoprazole (PROTONIX) 40 MG tablet Take 40 mg by mouth daily.   Yes [provider]  sertraline (ZOLOFT) 50 MG tablet Take 50 mg by mouth daily.   Yes [provider]  simvastatin (ZOCOR) 20 MG tablet Take 20 mg by mouth at bedtime.   Yes [provider]  tamsulosin (FLOMAX) 0.4 MG CAPS capsule Take 0.4 mg by mouth every evening. 11/09/19  Yes [provider]  Critical care time: n/a    Posey Boyer, NP [facility] 02/01/23 9:55 AM  Please see [email or web address] for pager details.  From 7A-7P if no response, please call [phone] After hours, please call ELink [phone]

## 2023-02-01 NOTE — TOC Initial Note (Signed)
Transition of Care [facility]) - Initial/Assessment Note    Patient Details  Name: Michael [name] MRN: [id number] Date of Birth: 1950-02-19  Transition of Care [facility]) CM/SW Contact:    Elliot Cousin, RN Phone Number: [phone] 02/01/2023, 8:32 AM  Clinical Narrative:                 CM spoke to pt and wife at bedside.  Offered choice for HH (medicare.gov list placed on chart and provided to pt). Pt was independent of ADL's pta.  He has RW and Rollator at home. Wife will provided transportation to home.  HH arrange preoperatively with [facility].  Will continue to follow for dc needs.  Expected Discharge Plan: Home w Home Health Services Barriers to Discharge: Continued Medical Work up   Patient Goals and CMS Choice Patient states their goals for this hospitalization and ongoing recovery are:: wants to recover CMS Medicare.gov Compare Post Acute Care list provided to:: Patient Represenative (must comment) Choice offered to / list presented to : Spouse      Expected Discharge Plan and Services   Discharge Planning Services: CM Consult Post Acute Care Choice: Home Health Living arrangements for the past 2 months: Single Family Home                           HH Arranged: RN, PT [facility] Agency: [facility] Date HH Agency Contacted: 01/31/23 Time HH Agency Contacted: 1600 Representative spoke with at [facility] Agency: Clarise Cruz  Prior Living Arrangements/Services Living arrangements for the past 2 months: Single Family Home Lives with:: Spouse Patient language and need for interpreter reviewed:: Yes Do you feel safe going back to the place where you live?: Yes      Need for Family Participation in Patient Care: Yes (Comment) Care giver support system in place?: Yes (comment) Current home services: DME ([employer], [employer]ator) Criminal Activity/Legal Involvement Pertinent to Current Situation/Hospitalization: No - Comment as  needed  Activities of Daily Living   ADL Screening (condition at time of admission) Independently performs ADLs?: Yes (appropriate for developmental age) Is the patient deaf or have difficulty hearing?: No Does the patient have difficulty seeing, even when wearing glasses/contacts?: No Does the patient have difficulty concentrating, remembering, or making decisions?: No  Permission Sought/Granted Permission sought to share information with : Case Manager, Family Supports, PCP Permission granted to share information with : Yes, Verbal Permission Granted  Share Information with NAME: Cire Cauthon  Permission granted to share info w AGENCY: Home Health  Permission granted to share info w Relationship: wife  Permission granted to share info w Contact Information: [phone]  Emotional Assessment Appearance:: Appears stated age Attitude/Demeanor/Rapport: Engaged Affect (typically observed): Accepting Orientation: : Oriented to Self, Oriented to Place, Oriented to  Time, Oriented to Situation   Psych Involvement: No (comment)  Admission diagnosis:  S/P mitral valve repair [Z98.890] Patient Active Problem List   Diagnosis Date Noted   S/P mitral valve repair 01/30/2023   Mitral valve disorder 11/14/2022   Coronary artery calcification seen on CT scan 04/03/2021   Aortic atherosclerosis (HCC) 04/03/2021   Moderate to severe mitral regurgitation 10/06/2020   Obesity (BMI 30.0-34.9) 01/29/2020   Nicotine use disorder 11/09/2015   HTN, goal below 130/80 11/09/2015   Tobacco use disorder 03/31/2014   OSA on CPAP 11/11/2013   Hypertension 06/06/2010   MVP (mitral valve prolapse)    HLD (hyperlipidemia)  Sleep apnea    PCP:  [name], MD Pharmacy:   [facility] [address], [place] - [address] [place] [address] Phone: [phone] Fax: [phone]     Social Drivers of Health (SDOH) Social History: SDOH Screenings   Food  Insecurity: No Food Insecurity (01/31/2023)  Housing: Low Risk  (01/31/2023)  Transportation Needs: No Transportation Needs (01/31/2023)  Utilities: Not At Risk (01/31/2023)  Tobacco Use: Medium Risk (01/30/2023)   SDOH Interventions:     Readmission Risk Interventions     No data to display

## 2023-02-01 NOTE — Progress Notes (Signed)
EP Telemetry Review   Pacing wires programmed at DDD 40 bpm.  Intrinsic rate 57 bpm, sinus brady.  His known sinus P waves are not large in amplitude. Known longstanding hx of bradycardia.  Walked with nurse and got into 80's with ambulation.  Avoid beta blockers, AV nodal blocking agents.   EP will be available PRN.  Please call back if new needs arise.   Canary Brim, MSN, APRN, NP-C, AGACNP-BC [facility] - Electrophysiology  02/01/2023, 3:07 PM

## 2023-02-01 NOTE — Progress Notes (Signed)
      [address]       [place] [address]             [phone]      POD # 2 MV repair  Up in chair, slow to mobilize  BP (!) 144/62 (BP Location: Right Arm)   Pulse 67   Temp 98.6 F (37 C) (Oral)   Resp (!) 22   Ht 5\' 11"  (1.803 m)   Wt 108.2 kg   SpO2 91%   BMI 33.27 kg/m  3L [place]   Intake/Output Summary (Last 24 hours) at 02/01/2023 1655 Last data filed at 02/01/2023 1400 Gross per 24 hour  Intake 343 ml  Output 2335 ml  Net -1992 ml   CBG well controlled  Continue current care  Toy Eisemann C. Dorris Fetch, MD [facility] [phone]

## 2023-02-01 NOTE — Progress Notes (Signed)
TCTS DAILY ICU PROGRESS NOTE                   [address]            [name] [address]          [phone]   2 Days Post-Op Procedure(s) (LRB): MITRAL VALVE REPAIR (MVR) UTILIZING SIMULUS SEMI-RIGID ANNULOPLASTY BAND SIZE (N/A) TRANSESOPHAGEAL ECHOCARDIOGRAM (TEE) (N/A)  Total Length of Stay:  LOS: 2 days   Subjective:  Sitting up in the bedside chair, walked in the hall earlier this morning.  No complaints or concerns.  Objective: Vital signs in last 24 hours: Temp:  [98.6 F (37 C)-99.9 F (37.7 C)] 98.9 F (37.2 C) (12/20 0322) Pulse Rate:  [55-71] 71 (12/20 0605) Cardiac Rhythm: Atrial paced (12/20 0600) Resp:  [14-30] 22 (12/20 0605) BP: (96-124)/(55-75) 99/72 (12/20 0600) SpO2:  [83 %-95 %] 90 % (12/20 0605) Weight:  [108.2 kg] 108.2 kg (12/20 0500)  Filed Weights   01/30/23 0700 01/31/23 0545 02/01/23 0500  Weight: 104.3 kg 109.5 kg 108.2 kg    Weight change: -1.3 kg      Intake/Output from previous day: 12/19 0701 - 12/20 0700 In: 1042.8 [P.O.:720; I.V.:122.8; IV Piggyback:200] Out: 2145 [Urine:1875; Chest Tube:270]  Intake/Output this shift: No intake/output data recorded.  Current Meds: Scheduled Meds:  acetaminophen  1,000 mg Oral Q6H   Or   acetaminophen (TYLENOL) oral liquid 160 mg/5 mL  1,000 mg Per Tube Q6H   acetaminophen (TYLENOL) oral liquid 160 mg/5 mL  650 mg Per Tube Once   aspirin EC  325 mg Oral Daily   Or   aspirin  324 mg Per Tube Daily   bisacodyl  10 mg Oral Daily   Or   bisacodyl  10 mg Rectal Daily   Chlorhexidine Gluconate Cloth  6 each Topical Daily   docusate sodium  200 mg Oral Daily   enoxaparin (LOVENOX) injection  40 mg Subcutaneous QHS   furosemide  40 mg Intravenous Q12H   [START ON 02/04/2023] influenza vaccine adjuvanted  0.5 mL Intramuscular Tomorrow-1000   insulin aspart  0-24 Units Subcutaneous Q4H   pantoprazole  40 mg Oral Daily   potassium chloride  20 mEq Oral BID   sertraline   50 mg Oral Daily   simvastatin  20 mg Oral QHS   sodium chloride flush  10 mL Intravenous Q12H   sodium chloride flush  10 mL Intravenous Q12H   sodium chloride flush  3 mL Intravenous Q12H   tamsulosin  0.4 mg Oral QPM   Continuous Infusions:  albumin human Stopped (01/31/23 0107)   nitroGLYCERIN     phenylephrine (NEO-SYNEPHRINE) Adult infusion Stopped (01/31/23 0904)   PRN Meds:.albumin human, metoprolol tartrate, morphine injection, ondansetron (ZOFRAN) IV, oxyCODONE, pneumococcal 20-valent conjugate vaccine, sodium chloride flush, traMADol  General appearance: alert, cooperative, and no distress Neurologic: intact Heart: Pacer in VVI @40 , appears to be in JR at 56-60/min. Lungs: normal work of breathing on 3Lnc O2.  Breath sounds are clear, shallow. CXR showing bibasilar volume loss and central vascular congestion.  Abdomen: soft, no tenderness Extremities: mild peripheral edema, all well perfused Wound: the sternal incision is covered with a dry dressing.   Lab Results: CBC: Recent Labs    01/31/23 1646 02/01/23 0356  WBC 22.0* 19.6*  HGB 12.6* 12.5*  HCT 38.1* 37.3*  PLT 151 151   BMET:  Recent Labs    01/31/23 1646 02/01/23 0356  NA 135 134*  K 4.7 4.5  CL 102 102  CO2 22 22  GLUCOSE 103* 119*  BUN 20 24*  CREATININE 1.58* 1.67*  CALCIUM 7.8* 7.9*    CMET: Lab Results  Component Value Date   WBC 19.6 (H) 02/01/2023   HGB 12.5 (L) 02/01/2023   HCT 37.3 (L) 02/01/2023   PLT 151 02/01/2023   GLUCOSE 119 (H) 02/01/2023   ALT 22 01/28/2023   AST 25 01/28/2023   NA 134 (L) 02/01/2023   K 4.5 02/01/2023   CL 102 02/01/2023   CREATININE 1.67 (H) 02/01/2023   BUN 24 (H) 02/01/2023   CO2 22 02/01/2023   INR 1.3 (H) 01/30/2023   HGBA1C 5.3 01/28/2023      PT/INR:  Recent Labs    01/30/23 1300  LABPROT 16.8*  INR 1.3*   Radiology: No results found.   Assessment/Plan: S/P Procedure(s) (LRB): MITRAL VALVE REPAIR (MVR) UTILIZING SIMULUS  SEMI-RIGID ANNULOPLASTY BAND SIZE (N/A) TRANSESOPHAGEAL ECHOCARDIOGRAM (TEE) (N/A)   -POD2 mitral valve repair for severe MR with closure of a small PFO.  BP stable off Neo.  Appreciate input from EP. HR has improved, holding BB.   -Leukocytosis- likely reactive and is trending down. No clinical evidence of infection but will follow.   -PULM- former 20py smoker quit in 2014.  Oxygenating well on 3Lnc. Has expected volume loss  / ATX on CXR.  Mobilizing, encouraging pulm hygiene.   -ENDO- no h/o DM. Glucose well controlled  -RENAL- baseline creat ~1.4, now 1.6.  UO adequate, Wt is 4kg+.  Continue Lasix 40mg  q12 h  -DVT PPX- ambulate, contine daily enoxaparin.   -Disposition- Keep in ICU to observe rhythm,  diurese, d/c the chest tubes and Foley catheter.    Leary Roca, PA-C [phone] 02/01/2023 7:53 AM

## 2023-02-02 ENCOUNTER — Inpatient Hospital Stay (HOSPITAL_COMMUNITY): Payer: Medicare Other

## 2023-02-02 LAB — CBC
HCT: 39.1 % (ref 39.0–52.0)
Hemoglobin: 13.1 g/dL (ref 13.0–17.0)
MCH: 31.6 pg (ref 26.0–34.0)
MCHC: 33.5 g/dL (ref 30.0–36.0)
MCV: 94.4 fL (ref 80.0–100.0)
Platelets: 168 10*3/uL (ref 150–400)
RBC: 4.14 MIL/uL — ABNORMAL LOW (ref 4.22–5.81)
RDW: 13.5 % (ref 11.5–15.5)
WBC: 20 10*3/uL — ABNORMAL HIGH (ref 4.0–10.5)
nRBC: 0 % (ref 0.0–0.2)

## 2023-02-02 LAB — MAGNESIUM: Magnesium: 2.3 mg/dL (ref 1.7–2.4)

## 2023-02-02 LAB — BASIC METABOLIC PANEL
Anion gap: 12 (ref 5–15)
BUN: 22 mg/dL (ref 8–23)
CO2: 21 mmol/L — ABNORMAL LOW (ref 22–32)
Calcium: 8 mg/dL — ABNORMAL LOW (ref 8.9–10.3)
Chloride: 100 mmol/L (ref 98–111)
Creatinine, Ser: 1.47 mg/dL — ABNORMAL HIGH (ref 0.61–1.24)
GFR, Estimated: 50 mL/min — ABNORMAL LOW (ref >60)
Glucose, Bld: 105 mg/dL — ABNORMAL HIGH (ref 70–99)
Potassium: 4.1 mmol/L (ref 3.5–5.1)
Sodium: 133 mmol/L — ABNORMAL LOW (ref 135–145)

## 2023-02-02 LAB — GLUCOSE, CAPILLARY
Glucose-Capillary: 101 mg/dL — ABNORMAL HIGH (ref 70–99)
Glucose-Capillary: 115 mg/dL — ABNORMAL HIGH (ref 70–99)
Glucose-Capillary: 87 mg/dL (ref 70–99)

## 2023-02-02 MED ORDER — AMIODARONE HCL IN DEXTROSE 360-4.14 MG/200ML-% IV SOLN
60.0000 mg/h | INTRAVENOUS | Status: AC
  Administered 2023-02-02 (×2): 60 mg/h via INTRAVENOUS

## 2023-02-02 MED ORDER — AMIODARONE HCL IN DEXTROSE 360-4.14 MG/200ML-% IV SOLN
INTRAVENOUS | Status: AC
  Filled 2023-02-02: qty 400

## 2023-02-02 MED ORDER — AMIODARONE HCL IN DEXTROSE 360-4.14 MG/200ML-% IV SOLN
30.0000 mg/h | INTRAVENOUS | Status: AC
Start: 2023-02-02 — End: 2023-02-03
  Administered 2023-02-02 – 2023-02-03 (×2): 30 mg/h via INTRAVENOUS
  Filled 2023-02-02 (×2): qty 200

## 2023-02-02 MED ORDER — AMIODARONE LOAD VIA INFUSION
150.0000 mg | Freq: Once | INTRAVENOUS | Status: AC
  Administered 2023-02-02: 150 mg via INTRAVENOUS

## 2023-02-02 NOTE — Progress Notes (Signed)
Pt. Able to operate cpap and place on mask himself. No issues at this time.

## 2023-02-02 NOTE — Plan of Care (Signed)
  Problem: Education: Goal: Knowledge of General Education information will improve Description: Including pain rating scale, medication(s)/side effects and non-pharmacologic comfort measures Outcome: Progressing   Problem: Health Behavior/Discharge Planning: Goal: Ability to manage health-related needs will improve Outcome: Progressing   Problem: Clinical Measurements: Goal: Ability to maintain clinical measurements within normal limits will improve Outcome: Progressing Goal: Will remain free from infection Outcome: Progressing Goal: Diagnostic test results will improve Outcome: Progressing Goal: Respiratory complications will improve Outcome: Progressing Goal: Cardiovascular complication will be avoided Outcome: Progressing   Problem: Activity: Goal: Risk for activity intolerance will decrease Outcome: Progressing   Problem: Nutrition: Goal: Adequate nutrition will be maintained Outcome: Progressing   Problem: Coping: Goal: Level of anxiety will decrease Outcome: Progressing   Problem: Elimination: Goal: Will not experience complications related to bowel motility Outcome: Progressing Goal: Will not experience complications related to urinary retention Outcome: Progressing   Problem: Pain Management: Goal: General experience of comfort will improve Outcome: Progressing   Problem: Safety: Goal: Ability to remain free from injury will improve Outcome: Progressing   Problem: Skin Integrity: Goal: Risk for impaired skin integrity will decrease Outcome: Progressing   Problem: Education: Goal: Will demonstrate proper wound care and an understanding of methods to prevent future damage Outcome: Progressing Goal: Knowledge of disease or condition will improve Outcome: Progressing Goal: Knowledge of the prescribed therapeutic regimen will improve Outcome: Progressing Goal: Individualized Educational Video(s) Outcome: Progressing   Problem: Activity: Goal: Risk for  activity intolerance will decrease Outcome: Progressing   Problem: Cardiac: Goal: Will achieve and/or maintain hemodynamic stability Outcome: Progressing   Problem: Clinical Measurements: Goal: Postoperative complications will be avoided or minimized Outcome: Progressing   Problem: Respiratory: Goal: Respiratory status will improve Outcome: Progressing   Problem: Skin Integrity: Goal: Wound healing without signs and symptoms of infection Outcome: Progressing Goal: Risk for impaired skin integrity will decrease Outcome: Progressing   Problem: Urinary Elimination: Goal: Ability to achieve and maintain adequate renal perfusion and functioning will improve Outcome: Progressing   Problem: Education: Goal: Will demonstrate proper wound care and an understanding of methods to prevent future damage Outcome: Progressing Goal: Knowledge of disease or condition will improve Outcome: Progressing Goal: Knowledge of the prescribed therapeutic regimen will improve Outcome: Progressing Goal: Individualized Educational Video(s) Outcome: Progressing   Problem: Activity: Goal: Risk for activity intolerance will decrease Outcome: Progressing   Problem: Cardiac: Goal: Will achieve and/or maintain hemodynamic stability Outcome: Progressing   Problem: Clinical Measurements: Goal: Postoperative complications will be avoided or minimized Outcome: Progressing   Problem: Respiratory: Goal: Respiratory status will improve Outcome: Progressing   Problem: Skin Integrity: Goal: Wound healing without signs and symptoms of infection Outcome: Progressing Goal: Risk for impaired skin integrity will decrease Outcome: Progressing   Problem: Urinary Elimination: Goal: Ability to achieve and maintain adequate renal perfusion and functioning will improve Outcome: Progressing

## 2023-02-02 NOTE — Progress Notes (Signed)
NAME:  Michael [name], MRN:  [id number], DOB:  [birth year], LOS: 3 ADMISSION DATE:  01/30/2023, CONSULTATION DATE:  01/30/2023 REFERRING MD:  Leafy Ro - TCTS CHIEF COMPLAINT:  S/p MVR   History of Present Illness:  72 year old male with past medical history of CAD, hiatal hernia, severe mitral regurgitation, MVP, OSA on CPAP, tobacco use, hyperlipidemia presents for mitral valve repair. He had lifelong murmur evaluated showing severe mitral regurgitation.   OR 12/18 with Dr. Leafy Ro and had complex mitral valve repair and closure of PFO. Intraoperative TEE showed EF 35-40%, global hypokinesis of LV. RV okay.   Pump time: 1hr 30m Xclamp time: 1hr 47m EBL: 1355cc Cell Saver: 680cc  Transferred to ICU post-operative. PCCM consulted for post-op vent management, medical management.   Pertinent Medical History:  CAD, hiatal hernia, HLD, severe mitral regurg, MVP, OSA on CPAP, tobacco use   Significant Hospital Events: Including procedures, antibiotic start and stop dates in addition to other pertinent events   12/18: s/p MVR, PFO closure, extubated  12/19: extubated yesterday without incident. No significant CT output. Still a little vasoplegic on neo 20. A-paced. Stop amiodarone. Underlying rhythm junctional. Starting lasix   Interim History / Subjective:  No significant events overnight This AM, went into Afib with RVR requiring amiodarone resumption Walked in hallway +palpitations Otherwise feeling well  Objective:  Blood pressure 100/80, pulse (!) 107, temperature 98.2 F (36.8 C), temperature source Oral, resp. rate 20, height 5\' 11"  (1.803 m), weight 104.7 kg, SpO2 94%.        Intake/Output Summary (Last 24 hours) at 02/02/2023 0809 Last data filed at 02/02/2023 0800 Gross per 24 hour  Intake 38.16 ml  Output 2650 ml  Net -2611.84 ml   Filed Weights   01/31/23 0545 02/01/23 0500 02/02/23 0500  Weight: 109.5 kg 108.2 kg 104.7 kg   Physical Examination: General: Acutely  ill-appearing older man in NAD. Up to chair at bedside, pleasant and conversant. HEENT: [place]/AT, anicteric sclera, PERRL, moist mucous membranes. Neuro: Awake, oriented x 4. Responds to verbal stimuli. Following commands consistently. Moves all 4 extremities spontaneously. Strength 5/5 in all 4 extremities.  CV: Irregularly irregular rhythm, rate 110s, no m/g/r. PULM: Breathing even and unlabored on 3LNC. Lung fields clear in upper fields, diminished at bases L > R. GI: Soft, nontender, nondistended. Normoactive bowel sounds. Extremities: No LE edema noted. Skin: Warm/dry, no rashes.  Resolved Hospital Problem List:     Assessment & Plan:  Severe mitral regurgitation due to MVP s/p complex mitral valve repair 12/18 Patent foramen ovale status post closure Bradycardia - POD#3 from MVR - Postoperative/pacing wire management per TCTS - Multimodal pain management - ASA/statin - Diuresis as tolerated - Evaluated by EP, appreciate recommendations; avoid Neo  Afib with RVR New Afib with RVR noted 12/20PM. - Amiodarone gtt - Enoxaparin ppx - Optimize electrolyes (K > 4, Mg > 2) - Cardiac monitoring   Acute respiratory insufficiency, postop; extubated 12/18 without incident  Tobacco use, 20 pack year hx, quit 2014  OSA on CPAP - Supplemental O2 support - Wean O2 for sat > 90% - CPAP at bedtime - Monitor I&Os, diuresis as tolerated - Pulmonary hygiene  AKI - Trend BMP - Replete electrolytes as indicated - Monitor I&Os in the setting of diuresis - Avoid nephrotoxic agents as able - Ensure adequate renal perfusion  Hyperlipidemia - Continue statin  Obesity - Diet, lifestyle/exercise modifications  Best Practice (right click and "Reselect all SmartList Selections" daily)   Diet/type: Regular  consistency (see orders) DVT prophylaxis: SCD, lovenox Pressure ulcer(s): na GI prophylaxis: PPI Lines: Central line- R internal jugular cordis remains Foley:  Yes, and it is still  needed> likely d/c soon Code Status:  full code Last date of multidisciplinary goals of care discussion [per primary]  Critical care time: N/A   Faythe Ghee [facility] 02/02/23 8:09 AM  Please see [email or web address] for pager details.  From 7A-7P if no response, please call [phone] After hours, please call ELink [phone]

## 2023-02-02 NOTE — Progress Notes (Signed)
3 Days Post-Op Procedure(s) (LRB): MITRAL VALVE REPAIR (MVR) UTILIZING SIMULUS SEMI-RIGID ANNULOPLASTY BAND SIZE (N/A) TRANSESOPHAGEAL ECHOCARDIOGRAM (TEE) (N/A) Subjective: No complaints at present, heart racing earlier after walk  Objective: Vital signs in last 24 hours: Temp:  [97.9 F (36.6 C)-99.2 F (37.3 C)] 98.2 F (36.8 C) (12/21 0757) Pulse Rate:  [58-136] 107 (12/21 0800) Cardiac Rhythm: Atrial fibrillation (12/21 0800) Resp:  [14-28] 20 (12/21 0800) BP: (100-149)/(59-94) 100/80 (12/21 0800) SpO2:  [90 %-94 %] 94 % (12/21 0800) Weight:  [104.7 kg] 104.7 kg (12/21 0500)  Hemodynamic parameters for last 24 hours:    Intake/Output from previous day: 12/20 0701 - 12/21 0700 In: -  Out: 2680 [Urine:2680] Intake/Output this shift: Total I/O In: 38.2 [I.V.:38.2] Out: -   General appearance: alert, cooperative, and no distress Neurologic: intact Heart: tachy and irregularly irregular Lungs: diminished breath sounds bibasilar Abdomen: normal findings: soft, non-tender Wound: clean and dry  Lab Results: Recent Labs    02/01/23 0356 02/02/23 0310  WBC 19.6* 20.0*  HGB 12.5* 13.1  HCT 37.3* 39.1  PLT 151 168   BMET:  Recent Labs    02/01/23 0356 02/02/23 0310  NA 134* 133*  K 4.5 4.1  CL 102 100  CO2 22 21*  GLUCOSE 119* 105*  BUN 24* 22  CREATININE 1.67* 1.47*  CALCIUM 7.9* 8.0*    PT/INR:  Recent Labs    01/30/23 1300  LABPROT 16.8*  INR 1.3*   ABG    Component Value Date/Time   PHART 7.317 (L) 01/30/2023 1900   HCO3 24.3 01/30/2023 1900   TCO2 26 01/30/2023 1900   ACIDBASEDEF 2.0 01/30/2023 1900   O2SAT 94 01/30/2023 1900   CBG (last 3)  Recent Labs    02/01/23 2351 02/02/23 0308 02/02/23 0754  GLUCAP 100* 101* 115*    Assessment/Plan: S/P Procedure(s) (LRB): MITRAL VALVE REPAIR (MVR) UTILIZING SIMULUS SEMI-RIGID ANNULOPLASTY BAND SIZE (N/A) TRANSESOPHAGEAL ECHOCARDIOGRAM (TEE) (N/A) POD # 3 NEURO- intact CV-  went into atrial fib with RVR this AM  Started on IV amiodarone  Keep pacing wires in case of brady when breaks RESP- bibasilar atelectasis  Continue IS RENAL- creatinine down to 1.47  Diuresed well, at preop weight  Hold Lasix today ENDO- CBG well controlled  Dc SSI GI- tolerating diet Leukocytosis- stable SCD + enoxaparin + ambulation   LOS: 3 days    Loreli Slot 02/02/2023

## 2023-02-02 NOTE — Progress Notes (Signed)
      [address]       [place] [address]             [phone]    POD # 3  Sleeping  Atrial fib this AM- broke, now paced at 70 bpm  BP 128/70 (BP Location: Right Arm)   Pulse 70   Temp 98.3 F (36.8 C) (Oral)   Resp 18   Ht 5\' 11"  (1.803 m)   Wt 104.7 kg   SpO2 96%   BMI 32.19 kg/m    Intake/Output Summary (Last 24 hours) at 02/02/2023 1701 Last data filed at 02/02/2023 1600 Gross per 24 hour  Intake 263.14 ml  Output 1725 ml  Net -1461.86 ml   Continue current treatments  Viviann Spare C. Dorris Fetch, MD [facility] [phone]

## 2023-02-03 ENCOUNTER — Inpatient Hospital Stay (HOSPITAL_COMMUNITY): Payer: Medicare Other

## 2023-02-03 DIAGNOSIS — R001 Bradycardia, unspecified: Secondary | ICD-10-CM | POA: Diagnosis not present

## 2023-02-03 DIAGNOSIS — I482 Chronic atrial fibrillation, unspecified: Secondary | ICD-10-CM | POA: Diagnosis not present

## 2023-02-03 DIAGNOSIS — Z9889 Other specified postprocedural states: Secondary | ICD-10-CM | POA: Diagnosis not present

## 2023-02-03 LAB — BASIC METABOLIC PANEL
Anion gap: 9 (ref 5–15)
BUN: 19 mg/dL (ref 8–23)
CO2: 24 mmol/L (ref 22–32)
Calcium: 8.1 mg/dL — ABNORMAL LOW (ref 8.9–10.3)
Chloride: 102 mmol/L (ref 98–111)
Creatinine, Ser: 1.28 mg/dL — ABNORMAL HIGH (ref 0.61–1.24)
GFR, Estimated: 59 mL/min — ABNORMAL LOW (ref >60)
Glucose, Bld: 95 mg/dL (ref 70–99)
Potassium: 4 mmol/L (ref 3.5–5.1)
Sodium: 135 mmol/L (ref 135–145)

## 2023-02-03 LAB — CBC
HCT: 32.8 % — ABNORMAL LOW (ref 39.0–52.0)
Hemoglobin: 11.2 g/dL — ABNORMAL LOW (ref 13.0–17.0)
MCH: 31.6 pg (ref 26.0–34.0)
MCHC: 34.1 g/dL (ref 30.0–36.0)
MCV: 92.7 fL (ref 80.0–100.0)
Platelets: 192 10*3/uL (ref 150–400)
RBC: 3.54 MIL/uL — ABNORMAL LOW (ref 4.22–5.81)
RDW: 13.5 % (ref 11.5–15.5)
WBC: 13 10*3/uL — ABNORMAL HIGH (ref 4.0–10.5)
nRBC: 0 % (ref 0.0–0.2)

## 2023-02-03 MED ORDER — LORATADINE 10 MG PO TABS: 10.0000 mg | ORAL_TABLET | Freq: Every day | ORAL | Status: DC | PRN

## 2023-02-03 MED ORDER — SODIUM CHLORIDE 0.9% FLUSH: 3.0000 mL | INTRAVENOUS | Status: DC | PRN

## 2023-02-03 MED ORDER — SODIUM CHLORIDE 0.9% FLUSH
3.0000 mL | Freq: Two times a day (BID) | INTRAVENOUS | Status: DC
  Administered 2023-02-03 – 2023-02-05 (×4): 3 mL via INTRAVENOUS

## 2023-02-03 MED ORDER — AMIODARONE IV BOLUS ONLY 150 MG/100ML
150.0000 mg | Freq: Once | INTRAVENOUS | Status: AC
  Administered 2023-02-03: 150 mg via INTRAVENOUS

## 2023-02-03 MED ORDER — MAGNESIUM HYDROXIDE 400 MG/5ML PO SUSP: 30.0000 mL | Freq: Every day | ORAL | Status: DC | PRN

## 2023-02-03 MED ORDER — ALUM & MAG HYDROXIDE-SIMETH 200-200-20 MG/5ML PO SUSP: 15.0000 mL | Freq: Four times a day (QID) | ORAL | Status: DC | PRN

## 2023-02-03 MED ORDER — FUROSEMIDE 40 MG PO TABS
40.0000 mg | ORAL_TABLET | Freq: Every day | ORAL | Status: DC
  Administered 2023-02-03: 40 mg via ORAL
  Filled 2023-02-03: qty 1

## 2023-02-03 MED ORDER — MAGNESIUM SULFATE 2 GM/50ML IV SOLN
2.0000 g | Freq: Once | INTRAVENOUS | Status: AC
Start: 2023-02-03 — End: 2023-02-03
  Administered 2023-02-03: 2 g via INTRAVENOUS
  Filled 2023-02-03: qty 50

## 2023-02-03 MED ORDER — SODIUM CHLORIDE 0.9 % IV SOLN: 250.0000 mL | INTRAVENOUS | Status: AC | PRN

## 2023-02-03 MED ORDER — ~~LOC~~ CARDIAC SURGERY, PATIENT & FAMILY EDUCATION: Freq: Once | Status: AC

## 2023-02-03 MED ORDER — AMIODARONE HCL 200 MG PO TABS
400.0000 mg | ORAL_TABLET | Freq: Two times a day (BID) | ORAL | Status: DC
  Administered 2023-02-03 – 2023-02-05 (×5): 400 mg via ORAL
  Filled 2023-02-03 (×5): qty 2

## 2023-02-03 NOTE — Progress Notes (Signed)
Pt arrived from [facility] A/ox 4pt denies any pain, MD aware,CCMD called. CHG bath given,no further needs at this time

## 2023-02-03 NOTE — Plan of Care (Signed)

## 2023-02-03 NOTE — Progress Notes (Signed)
   NAME:  Michael [name], MRN:  [id number], DOB:  November 30, 1950, LOS: 4 ADMISSION DATE:  01/30/2023, CONSULTATION DATE:  01/30/2023 REFERRING MD:  Leafy Ro - TCTS CHIEF COMPLAINT:  S/p MVR   History of Present Illness:  72 year old male with past medical history of CAD, hiatal hernia, severe mitral regurgitation, MVP, OSA on CPAP, tobacco use, hyperlipidemia presents for mitral valve repair. He had lifelong murmur evaluated showing severe mitral regurgitation.   OR 12/18 with Dr. Leafy Ro and had complex mitral valve repair and closure of PFO. Intraoperative TEE showed EF 35-40%, global hypokinesis of LV. RV okay.   Pump time: 1hr 17m Xclamp time: 1hr 26m EBL: 1355cc Cell Saver: 680cc  Transferred to ICU post-operative. PCCM consulted for post-op vent management, medical management.   Pertinent Medical History:  CAD, hiatal hernia, HLD, severe mitral regurg, MVP, OSA on CPAP, tobacco use   Significant Hospital Events: Including procedures, antibiotic start and stop dates in addition to other pertinent events   12/18: s/p MVR, PFO closure, extubated  12/19: extubated yesterday without incident. No significant CT output. Still a little vasoplegic on neo 20. A-paced. Stop amiodarone. Underlying rhythm junctional. Starting lasix   Interim History / Subjective:  With amiodarone patient became bradycardic in low 50s In the morning he was placed on atrial paced rhythm IV amiodarone was converted to p.o.   Objective:  Blood pressure 112/70, pulse 69, temperature 98.6 F (37 C), temperature source Oral, resp. rate 17, height 5\' 11"  (1.803 m), weight 104.7 kg, SpO2 95%.        Intake/Output Summary (Last 24 hours) at 02/03/2023 0936 Last data filed at 02/03/2023 0900 Gross per 24 hour  Intake 834.29 ml  Output 850 ml  Net -15.71 ml   Filed Weights   02/01/23 0500 02/02/23 0500 02/03/23 0500  Weight: 108.2 kg 104.7 kg 104.7 kg   Physical Examination: General: Elderly obese male, lying  on the bed HEENT: [place]/AT, eyes anicteric.  moist mucus membranes Neuro: Alert, awake following commands Chest: Central sternotomy incision looks clean and dry, coarse breath sounds, no wheezes or rhonchi Heart: Sinus bradycardia with PACs, no murmurs or gallops Abdomen: Soft, nontender, nondistended, bowel sounds present Skin: No rash  Labs and images reviewed  Resolved Hospital Problem List:     Assessment & Plan:  Severe mitral regurgitation due to MVP s/p complex mitral valve repair 12/18 Patent foramen ovale status post closure Junctional bradycardia, improved Paroxysmal A-fib Amiodarone converted to p.o. Continue Lasix Continue aspirin and statin EP recommend watchful waiting Anticoagulation initiation deferred to TCTS  Acute respiratory insufficiency, postop; extubated 12/18 without incident  Tobacco use, 20 pack year hx, quit 2014  OSA on CPAP Endocrine incentive spirometry Titrate oxygen with O2 sat goal 90% Encourage ambulation Sleep appetite  AKI Serum creatinine continue to improve, monitor intake and output and avoid nephrotoxic agent  Hyperlipidemia Continue statin  Obesity Diet and exercise counseling provided   Best Practice (right click and "Reselect all SmartList Selections" daily)   Per primary team     Cheri Fowler, MD [facility] See Amion for pager If no response to pager, please call [phone] until 7pm After 7pm, Please call E-link [phone]

## 2023-02-03 NOTE — Progress Notes (Signed)
Rounding Note    Patient Name: Michael [name] Date of Encounter: 02/03/2023  [facility] Cardiologist: Kristeen Miss, MD   Subjective   Feeling well without acute complaint  Inpatient Medications    Scheduled Meds:  acetaminophen  1,000 mg Oral Q6H   Or   acetaminophen (TYLENOL) oral liquid 160 mg/5 mL  1,000 mg Per Tube Q6H   acetaminophen (TYLENOL) oral liquid 160 mg/5 mL  650 mg Per Tube Once   amiodarone  400 mg Oral BID   aspirin EC  325 mg Oral Daily   Or   aspirin  324 mg Per Tube Daily   bisacodyl  10 mg Oral Daily   Or   bisacodyl  10 mg Rectal Daily   Chlorhexidine Gluconate Cloth  6 each Topical Daily   docusate sodium  200 mg Oral Daily   enoxaparin (LOVENOX) injection  40 mg Subcutaneous QHS   furosemide  40 mg Oral Daily   [START ON 02/04/2023] influenza vaccine adjuvanted  0.5 mL Intramuscular Tomorrow-1000   pantoprazole  40 mg Oral Daily   potassium chloride  20 mEq Oral BID   sertraline  50 mg Oral Daily   simvastatin  20 mg Oral QHS   tamsulosin  0.4 mg Oral QPM   Continuous Infusions:  albumin human Stopped (01/31/23 0107)   amiodarone 30 mg/hr (02/03/23 0900)   PRN Meds: albumin human, metoprolol tartrate, morphine injection, ondansetron (ZOFRAN) IV, oxyCODONE, pneumococcal 20-valent conjugate vaccine, traMADol   Vital Signs    Vitals:   02/03/23 0800 02/03/23 0900 02/03/23 0930 02/03/23 1000  BP: 109/62 112/70 106/68 92/70  Pulse: 69 69 69 71  Resp: 17 17 15  (!) 25  Temp:      TempSrc:      SpO2: 94% 95% 95% 90%  Weight:      Height:        Intake/Output Summary (Last 24 hours) at 02/03/2023 1043 Last data filed at 02/03/2023 0900 Gross per 24 hour  Intake 801.03 ml  Output 850 ml  Net -48.97 ml      02/03/2023    5:00 AM 02/02/2023    5:00 AM 02/01/2023    5:00 AM  Last 3 Weights  Weight (lbs) 230 lb 13.2 oz 230 lb 13.2 oz 238 lb 8.6 oz  Weight (kg) 104.7 kg 104.7 kg 108.2 kg      Telemetry    Sinus  rhythm with both atrial and ventricular ectopy- Personally Reviewed  ECG    Atrial fibrillation- Personally Reviewed  Physical Exam   GEN: No acute distress.   Neck: No JVD Cardiac: RRR, no murmurs, rubs, or gallops.  Respiratory: Clear to auscultation bilaterally. GI: Soft, nontender, non-distended  MS: No edema; No deformity. Neuro:  Nonfocal  Psych: Normal affect   Labs    High Sensitivity Troponin:  No results for input(s): "TROPONINIHS" in the last 720 hours.   Chemistry Recent Labs  Lab 01/28/23 1028 01/30/23 0859 01/31/23 0356 01/31/23 1646 02/01/23 0356 02/02/23 0310 02/03/23 0209  NA 142   < > 136 135 134* 133* 135  K 3.9   < > 4.2 4.7 4.5 4.1 4.0  CL 109   < > 109 102 102 100 102  CO2 25   < > 21* 22 22 21* 24  GLUCOSE 94   < > 121* 103* 119* 105* 95  BUN 14   < > 17 20 24* 22 19  CREATININE 1.48*   < >  1.30* 1.58* 1.67* 1.47* 1.28*  CALCIUM 9.2   < > 7.4* 7.8* 7.9* 8.0* 8.1*  MG  --    < > 2.4 2.4  --  2.3  --   PROT 6.6  --   --   --   --   --   --   ALBUMIN 3.7  --   --   --   --   --   --   AST 25  --   --   --   --   --   --   ALT 22  --   --   --   --   --   --   ALKPHOS 87  --   --   --   --   --   --   BILITOT 0.8  --   --   --   --   --   --   GFRNONAA 50*   < > 58* 46* 43* 50* 59*  ANIONGAP 8   < > 6 11 10 12 9    < > = values in this interval not displayed.    Lipids No results for input(s): "CHOL", "TRIG", "HDL", "LABVLDL", "LDLCALC", "CHOLHDL" in the last 168 hours.  Hematology Recent Labs  Lab 02/01/23 0356 02/02/23 0310 02/03/23 0209  WBC 19.6* 20.0* 13.0*  RBC 3.95* 4.14* 3.54*  HGB 12.5* 13.1 11.2*  HCT 37.3* 39.1 32.8*  MCV 94.4 94.4 92.7  MCH 31.6 31.6 31.6  MCHC 33.5 33.5 34.1  RDW 13.9 13.5 13.5  PLT 151 168 192   Thyroid No results for input(s): "TSH", "FREET4" in the last 168 hours.  BNPNo results for input(s): "BNP", "PROBNP" in the last 168 hours.  DDimer No results for input(s): "DDIMER" in the last 168 hours.    Radiology    DG Chest Port 1 View Result Date: 02/03/2023 CLINICAL DATA:  Status post MVR. EXAM: PORTABLE CHEST 1 VIEW COMPARISON:  02/02/2023 FINDINGS: Low lung volumes. The cardio pericardial silhouette is enlarged. Basilar atelectasis again noted with probable tiny effusions. No evidence for pulmonary edema although mild vascular congestion noted. Telemetry leads overlie the chest. IMPRESSION: Low lung volumes with basilar atelectasis and probable tiny effusions. Electronically Signed   By: Kennith Center M.D.   On: 02/03/2023 08:23   DG CHEST PORT 1 VIEW Result Date: 02/02/2023 CLINICAL DATA:  [id number] Encounter for chest tube removal [id number] EXAM: PORTABLE CHEST 1 VIEW COMPARISON:  Chest radiograph from one day prior. FINDINGS: Intact sternotomy wires. Cardiac valvular annuloplasty ring in place. Stable cardiomediastinal silhouette with mild cardiomegaly. No pneumothorax. No pleural effusion. Possible small bilateral pleural effusions, unchanged. Mild hazy bibasilar lung opacities, similar. IMPRESSION: 1. No pneumothorax. 2. Possible small bilateral pleural effusions, unchanged. 3. Low lung volumes with mild hazy bibasilar lung opacities, similar, favor atelectasis. Electronically Signed   By: Delbert Phenix M.D.   On: 02/02/2023 09:37    Cardiac Studies     Patient Profile     72 y.o. male post mitral valve repair and PFO closure with postop bradycardia and atrial fibrillation  Assessment & Plan    1.  Junctional bradycardia: Appears to have resolved.  He is in sinus rhythm with both atrial and ventricular ectopy.  On amiodarone IV.  Have turned down pacing rate from 70-50.  2.  Atrial fibrillation: Appears paroxysmal.  He did not have a prior diagnosis of atrial fibrillation.  Would start anticoagulation per surgical team.  Agree with amiodarone.  3.  Ventricular tachycardia: Continuing to have PVCs.  On amiodarone.  PT occurred as he was coming off pump from surgery.  No further  episodes.  4.  Mitral valve repair: Plan per primary team       For questions or updates, please contact [facility] Please consult [email or web address] for contact info under        Signed, Equan Cogbill Jorja Loa, MD  02/03/2023, 10:43 AM

## 2023-02-03 NOTE — Progress Notes (Signed)
4 Days Post-Op Procedure(s) (LRB): MITRAL VALVE REPAIR (MVR) UTILIZING SIMULUS SEMI-RIGID ANNULOPLASTY BAND SIZE (N/A) TRANSESOPHAGEAL ECHOCARDIOGRAM (TEE) (N/A) Subjective: No complaints this AM   Objective: Vital signs in last 24 hours: Temp:  [98.3 F (36.8 C)-99 F (37.2 C)] 98.6 F (37 C) (12/22 0734) Pulse Rate:  [57-109] 69 (12/22 0800) Cardiac Rhythm: Atrial paced (12/22 0800) Resp:  [15-24] 17 (12/22 0800) BP: (93-135)/(52-91) 109/62 (12/22 0800) SpO2:  [90 %-98 %] 94 % (12/22 0800) Weight:  [104.7 kg] 104.7 kg (12/22 0500)  Hemodynamic parameters for last 24 hours:    Intake/Output from previous day: 12/21 0701 - 12/22 0700 In: 752.5 [P.O.:240; I.V.:512.5] Out: 850 [Urine:850] Intake/Output this shift: Total I/O In: 136.7 [P.O.:120; I.V.:16.7] Out: -   General appearance: alert, cooperative, and no distress Neurologic: intact Heart: regular rate and rhythm Lungs: diminished breath sounds bibasilar Wound: clean and dry  Lab Results: Recent Labs    02/02/23 0310 02/03/23 0209  WBC 20.0* 13.0*  HGB 13.1 11.2*  HCT 39.1 32.8*  PLT 168 192   BMET:  Recent Labs    02/02/23 0310 02/03/23 0209  NA 133* 135  K 4.1 4.0  CL 100 102  CO2 21* 24  GLUCOSE 105* 95  BUN 22 19  CREATININE 1.47* 1.28*  CALCIUM 8.0* 8.1*    PT/INR: No results for input(s): "LABPROT", "INR" in the last 72 hours. ABG    Component Value Date/Time   PHART 7.317 (L) 01/30/2023 1900   HCO3 24.3 01/30/2023 1900   TCO2 26 01/30/2023 1900   ACIDBASEDEF 2.0 01/30/2023 1900   O2SAT 94 01/30/2023 1900   CBG (last 3)  Recent Labs    02/02/23 0308 02/02/23 0754 02/02/23 1205  GLUCAP 101* 115* 87    Assessment/Plan: S/P Procedure(s) (LRB): MITRAL VALVE REPAIR (MVR) UTILIZING SIMULUS SEMI-RIGID ANNULOPLASTY BAND SIZE (N/A) TRANSESOPHAGEAL ECHOCARDIOGRAM (TEE) (N/A) POD # 4 NEURO- intact CV- in SB in low 50s this AM, A paced at 70 bpm  On IV amiodarone for AF  RVR yesterday  Will convert to PO amiodarone  Not on a beta blocker  May need pacer RESP- atelectasis still present but improved  Continue IS RENAL- creatinine down to 1.28  Will start PO Lasix ENDO- CBG well controlled GI- tolerating diet Leukocytosis- likely reactive, WBC down to 13K   LOS: 4 days    Loreli Slot 02/03/2023

## 2023-02-04 ENCOUNTER — Inpatient Hospital Stay (HOSPITAL_COMMUNITY): Payer: Medicare Other

## 2023-02-04 DIAGNOSIS — R001 Bradycardia, unspecified: Secondary | ICD-10-CM | POA: Diagnosis not present

## 2023-02-04 DIAGNOSIS — I482 Chronic atrial fibrillation, unspecified: Secondary | ICD-10-CM | POA: Diagnosis not present

## 2023-02-04 DIAGNOSIS — Z9889 Other specified postprocedural states: Secondary | ICD-10-CM | POA: Diagnosis not present

## 2023-02-04 LAB — BASIC METABOLIC PANEL
Anion gap: 10 (ref 5–15)
BUN: 18 mg/dL (ref 8–23)
CO2: 24 mmol/L (ref 22–32)
Calcium: 8 mg/dL — ABNORMAL LOW (ref 8.9–10.3)
Chloride: 100 mmol/L (ref 98–111)
Creatinine, Ser: 1.32 mg/dL — ABNORMAL HIGH (ref 0.61–1.24)
GFR, Estimated: 57 mL/min — ABNORMAL LOW (ref >60)
Glucose, Bld: 95 mg/dL (ref 70–99)
Potassium: 3.7 mmol/L (ref 3.5–5.1)
Sodium: 134 mmol/L — ABNORMAL LOW (ref 135–145)

## 2023-02-04 LAB — CBC
HCT: 32.9 % — ABNORMAL LOW (ref 39.0–52.0)
Hemoglobin: 10.9 g/dL — ABNORMAL LOW (ref 13.0–17.0)
MCH: 31.1 pg (ref 26.0–34.0)
MCHC: 33.1 g/dL (ref 30.0–36.0)
MCV: 94 fL (ref 80.0–100.0)
Platelets: 215 10*3/uL (ref 150–400)
RBC: 3.5 MIL/uL — ABNORMAL LOW (ref 4.22–5.81)
RDW: 13.7 % (ref 11.5–15.5)
WBC: 10.4 10*3/uL (ref 4.0–10.5)
nRBC: 0 % (ref 0.0–0.2)

## 2023-02-04 LAB — MAGNESIUM: Magnesium: 2.1 mg/dL (ref 1.7–2.4)

## 2023-02-04 MED ORDER — FUROSEMIDE 40 MG PO TABS
40.0000 mg | ORAL_TABLET | Freq: Every day | ORAL | Status: DC
  Administered 2023-02-04: 40 mg via ORAL
  Filled 2023-02-04: qty 1

## 2023-02-04 NOTE — Progress Notes (Signed)
Pt went into afib RVR. Getting EKG when he converted back to 60's HR. Still attached to pacer. Patient asymptomatic. Denies CP or SOB. VSS  EKG placed in chart. Will continue to monitor

## 2023-02-04 NOTE — Plan of Care (Signed)
  Problem: Clinical Measurements: Goal: Will remain free from infection Outcome: Progressing   Problem: Activity: Goal: Risk for activity intolerance will decrease Outcome: Progressing   

## 2023-02-04 NOTE — Progress Notes (Addendum)
  Patient Name: Michael [name] Date of Encounter: 02/04/2023  Primary Cardiologist: Kristeen Miss, MD Electrophysiologist: Lanier Prude, MD  Interval Summary   The patient is doing well this morning.  At this time, the patient denies chest pain, shortness of breath, or any new concerns.  Vital Signs    Vitals:   02/03/23 1900 02/03/23 1934 02/03/23 2300 02/04/23 0430  BP: (!) 97/51 (!) 115/52 117/61 (!) 156/70  Pulse: (!) 50  (!) 55 (!) 52  Resp: (!) 21  16 (!) 23  Temp:  98.3 F (36.8 C) 97.8 F (36.6 C) 98.1 F (36.7 C)  TempSrc:  Oral Oral Oral  SpO2: 97%  97%   Weight:    103.4 kg  Height:        Intake/Output Summary (Last 24 hours) at 02/04/2023 0834 Last data filed at 02/04/2023 0003 Gross per 24 hour  Intake 156.84 ml  Output 450 ml  Net -293.16 ml   Filed Weights   02/02/23 0500 02/03/23 0500 02/04/23 0430  Weight: 104.7 kg 104.7 kg 103.4 kg    Physical Exam    GEN- The patient is well appearing, alert and oriented x 3 today.   Lungs- Clear to ausculation bilaterally, normal work of breathing Cardiac- Regular rate and rhythm, no murmurs, rubs or gallops GI- soft, NT, ND, + BS Extremities- no clubbing or cyanosis. No edema  Telemetry    SR with ~1h of SVT Inappropriate pacing (personally reviewed)  Hospital Course    Michael [name] is a 72 y.o. male s/p MVr and PFO closure with post-op bradycardia and AFib.   Assessment & Plan    #) Functional bradycardia Largely in sinus rhythm with narrow QRS Lowered temp pacer from 50 > 40 this AM, ok to remove per primary team  #) AF Continue IV amiodarone, consider tx to PO Initiate OAC when appropriate per primary team  #) VT Continues to have rare PVCs, no further VT Continue amiodarone as above          For questions or updates, please contact [facility] Please consult [email or web address] for contact info under Cardiology/STEMI.  Signed, Sherie Don, NP  02/04/2023, 8:34 AM   I  have seen and examined this patient with Sherie Don.  Agree with above, note added to reflect my findings.  Patient feeling well without complaints.  GEN: Well nourished, well developed, in no acute distress  HEENT: normal  Neck: no JVD, carotid bruits, or masses Cardiac: RRR; no murmurs, rubs, or gallops,no edema  Respiratory:  clear to auscultation bilaterally, normal work of breathing GI: soft, nontender, nondistended, + BS MS: no deformity or atrophy  Skin: warm and dry Neuro:  Strength and sensation are intact Psych: euthymic mood, full affect   Junctional bradycardia: Appears to have P waves.  His device is intermittently atrial pacing.  I think it is reasonable to pull the pacing wires today.  Likely Michael [name] be able to avoid pacemaker implant. Paroxysmal atrial fibrillation: Continue amiodarone.  Anticoagulation per primary team. Ventricular tachycardia: Occurred when coming off cardiopulmonary bypass.  No further episodes. Mitral valve repair: Plan per primary team  Michael Youngberg M. Rachna Schonberger MD 02/04/2023 10:09 AM

## 2023-02-04 NOTE — Progress Notes (Addendum)
[address]       [place] [address]             [phone]      5 Days Post-Op Procedure(s) (LRB): MITRAL VALVE REPAIR (MVR) UTILIZING SIMULUS SEMI-RIGID ANNULOPLASTY BAND SIZE (N/A) TRANSESOPHAGEAL ECHOCARDIOGRAM (TEE) (N/A) Subjective: Patient denies pain and SOB this AM. Has no complaints this AM.   Objective: Vital signs in last 24 hours: Temp:  [97.8 F (36.6 C)-98.6 F (37 C)] 98.1 F (36.7 C) (12/23 0430) Pulse Rate:  [50-108] 52 (12/23 0430) Cardiac Rhythm: Atrial fibrillation;Other (Comment) (12/23 0015) Resp:  [14-25] 23 (12/23 0430) BP: (92-156)/(51-110) 156/70 (12/23 0430) SpO2:  [81 %-98 %] 97 % (12/22 2300) Weight:  [103.4 kg] 103.4 kg (12/23 0430)  Hemodynamic parameters for last 24 hours:    Intake/Output from previous day: 12/22 0701 - 12/23 0700 In: 293.5 [P.O.:120; I.V.:173.5] Out: 450 [Urine:450] Intake/Output this shift: No intake/output data recorded.  General appearance: alert, cooperative, and no distress Neurologic: intact Heart: NSR with PVCs, a paced and not capturing Lungs: diminished bibasilar breath sounds Abdomen: soft, non-tender; bowel sounds normal; no masses,  no organomegaly Extremities: extremities normal, atraumatic, no cyanosis or edema Wound: Clean and dry without sign of infection  Lab Results: Recent Labs    02/03/23 0209 02/04/23 0252  WBC 13.0* 10.4  HGB 11.2* 10.9*  HCT 32.8* 32.9*  PLT 192 215   BMET:  Recent Labs    02/03/23 0209 02/04/23 0252  NA 135 134*  K 4.0 3.7  CL 102 100  CO2 24 24  GLUCOSE 95 95  BUN 19 18  CREATININE 1.28* 1.32*  CALCIUM 8.1* 8.0*    PT/INR: No results for input(s): "LABPROT", "INR" in the last 72 hours. ABG    Component Value Date/Time   PHART 7.317 (L) 01/30/2023 1900   HCO3 24.3 01/30/2023 1900   TCO2 26 01/30/2023 1900   ACIDBASEDEF 2.0 01/30/2023 1900   O2SAT 94 01/30/2023 1900   CBG (last 3)  Recent Labs    02/02/23 0308  02/02/23 0754 02/02/23 1205  GLUCAP 101* 115* 87    Assessment/Plan: S/P Procedure(s) (LRB): MITRAL VALVE REPAIR (MVR) UTILIZING SIMULUS SEMI-RIGID ANNULOPLASTY BAND SIZE (N/A) TRANSESOPHAGEAL ECHOCARDIOGRAM (TEE) (N/A)  CV: Hx of junctional bradycardia appears resolved. Developed paroxysmal atrial fibrillation. On PO Amiodarone. A paced at 50 bpm, but does not look like it is capturing appropriately. HR 50s this AM with often PVCs. EP following for possible pacer. Not on a BB due to bradycardia. Elevated BP this AM at 156/70 but has otherwise been well controlled. Will monitor. Per the RN he went into asx afib with RVR around midnight but quickly converted without intervention. May need Eliquis? Per EP plan to d/c EPW and monitor, they do not feel he will need a pacer.   Pulm: Saturating well on 2L [place]. CXR with bibasilar atelectasis. Encourage IS and ambulation. Wean O2 as tolerated. Will add flutter valve.   GI: +BM 12/20, passing gas. Tolerating a diet.   Endo: Preop A1C 5.3. No hx of DM. CBGs controlled, SSI and CBGs d/c'd.   Renal: Cr 1.32, stable from 1.28 yesterday. UO 450cc/24hr recorded. Under preop weight. Will hold Lasix. K 3.7, supplement.   ID: Likely reactive leukocytosis resolved  Expected postop ABLA: H/H 10.9/32.9 stable, not clinically significant at this time  DVT Prophylaxis: Lovenox  Dispo: D/C EPW and monitor rhythm. Work on ambulation. Dispo planning   LOS:  5 days    Jenny Reichmann, PA-C 02/04/2023  Patient seen and examined, discussed with Ms. Stehler Findings and plan outlined above  Viviann Spare C. Dorris Fetch, MD [facility] [phone]

## 2023-02-04 NOTE — Progress Notes (Signed)
Mobility Specialist Progress Note:   02/04/23 1442  Mobility  Activity Ambulated with assistance in hallway  Level of Assistance Contact guard assist, steadying assist  Assistive Device Front wheel walker  Distance Ambulated (ft) 272 ft  RUE Weight Bearing Per Provider Order NWB  LUE Weight Bearing Per Provider Order NWB  Activity Response Tolerated well  Mobility Referral Yes  Mobility visit 1 Mobility  Mobility Specialist Start Time (ACUTE ONLY) 1410  Mobility Specialist Stop Time (ACUTE ONLY) 1425  Mobility Specialist Time Calculation (min) (ACUTE ONLY) 15 min   Pt received in bed, agreeable to mobility. Independent bed mobility. MinG to stand within precautions. Pt denied any discomfort during ambulation, asx throughout. VSS. Pt left in chair with call bell in reach and all needs met.   Leory Plowman  Mobility Specialist Please contact via [employer] office at [phone]

## 2023-02-04 NOTE — Progress Notes (Addendum)
CARDIAC REHAB PHASE I   PRE:  Rate/Rhythm: 60 paced    BP: lying 125/67    SpO2: 95 2L, 92 RA  MODE:  Ambulation: 250 ft   POST:  Rate/Rhythm: 70 pacing    BP: sitting 138/77     SpO2: 92 RA  Pt able to roll to EOB then stand with min assist. C/o lightheadedness with walking, resolved with distance. Rhythm stable. Able to walk without O2. Slightly unsteady at times with RW due inconsistent placement of left foot. Fatigue with distance. To recliner.   Discussed with pt and wife IS/flutter, sternal precautions, exercise, vaping cessation, diet, and CRPII. Pt receptive although HOH. He is considering vaping cessation. Tips and resources given. Gave [facility] d/c video to view. Encouraged x2 more walks. He has RW and rollator. Will refer to [facility] CRPII.   [phone]  Ethelda Chick BS, ACSM-CEP 02/04/2023 9:58 AM

## 2023-02-05 ENCOUNTER — Other Ambulatory Visit: Payer: Self-pay | Admitting: Student

## 2023-02-05 DIAGNOSIS — Z9889 Other specified postprocedural states: Secondary | ICD-10-CM

## 2023-02-05 LAB — BASIC METABOLIC PANEL
Anion gap: 10 (ref 5–15)
BUN: 18 mg/dL (ref 8–23)
CO2: 24 mmol/L (ref 22–32)
Calcium: 8.4 mg/dL — ABNORMAL LOW (ref 8.9–10.3)
Chloride: 101 mmol/L (ref 98–111)
Creatinine, Ser: 1.56 mg/dL — ABNORMAL HIGH (ref 0.61–1.24)
GFR, Estimated: 47 mL/min — ABNORMAL LOW (ref >60)
Glucose, Bld: 105 mg/dL — ABNORMAL HIGH (ref 70–99)
Potassium: 3.5 mmol/L (ref 3.5–5.1)
Sodium: 135 mmol/L (ref 135–145)

## 2023-02-05 LAB — MAGNESIUM: Magnesium: 1.9 mg/dL (ref 1.7–2.4)

## 2023-02-05 MED ORDER — APIXABAN 5 MG PO TABS: 5.0000 mg | ORAL_TABLET | Freq: Two times a day (BID) | ORAL | 1 refills | Status: DC

## 2023-02-05 MED ORDER — ASPIRIN 81 MG PO TBEC
81.0000 mg | DELAYED_RELEASE_TABLET | Freq: Every day | ORAL | Status: DC
  Administered 2023-02-05: 81 mg via ORAL
  Filled 2023-02-05: qty 1

## 2023-02-05 MED ORDER — LOPERAMIDE HCL 2 MG PO CAPS: 2.0000 mg | ORAL_CAPSULE | ORAL | Status: DC | PRN

## 2023-02-05 MED ORDER — POTASSIUM CHLORIDE CRYS ER 20 MEQ PO TBCR
40.0000 meq | EXTENDED_RELEASE_TABLET | Freq: Two times a day (BID) | ORAL | Status: DC
  Administered 2023-02-05: 40 meq via ORAL
  Filled 2023-02-05: qty 2

## 2023-02-05 MED ORDER — AMIODARONE HCL 200 MG PO TABS: ORAL_TABLET | ORAL | 1 refills | Status: DC

## 2023-02-05 MED ORDER — APIXABAN 5 MG PO TABS
5.0000 mg | ORAL_TABLET | Freq: Two times a day (BID) | ORAL | Status: DC
  Administered 2023-02-05: 5 mg via ORAL
  Filled 2023-02-05: qty 1

## 2023-02-05 MED ORDER — TRAMADOL HCL 50 MG PO TABS: 50.0000 mg | ORAL_TABLET | Freq: Four times a day (QID) | ORAL | 0 refills | Status: DC | PRN

## 2023-02-05 MED ORDER — ACETAMINOPHEN 325 MG PO TABS: 650.0000 mg | ORAL_TABLET | Freq: Four times a day (QID) | ORAL | Status: DC | PRN

## 2023-02-05 MED ORDER — MAGNESIUM OXIDE -MG SUPPLEMENT 400 (240 MG) MG PO TABS
400.0000 mg | ORAL_TABLET | Freq: Two times a day (BID) | ORAL | Status: DC
  Administered 2023-02-05: 400 mg via ORAL
  Filled 2023-02-05: qty 1

## 2023-02-05 NOTE — Progress Notes (Addendum)
[address]       [place] [address]             [phone]      6 Days Post-Op Procedure(s) (LRB): MITRAL VALVE REPAIR (MVR) UTILIZING SIMULUS SEMI-RIGID ANNULOPLASTY BAND SIZE (N/A) TRANSESOPHAGEAL ECHOCARDIOGRAM (TEE) (N/A) Subjective: The patient states he wants to go home today and feels like he is in prison sitting in his room. He feels like we can't do anything more for him.  Objective: Vital signs in last 24 hours: Temp:  [97.4 F (36.3 C)-98.5 F (36.9 C)] 98.1 F (36.7 C) (12/24 0549) Pulse Rate:  [55-61] 61 (12/24 0549) Cardiac Rhythm: Normal sinus rhythm;Bundle branch block (12/23 1910) Resp:  [16-20] 18 (12/24 0549) BP: (112-150)/(57-82) 150/82 (12/24 0549) SpO2:  [91 %-95 %] 94 % (12/24 0549) Weight:  [100.9 kg] 100.9 kg (12/24 0549)  Hemodynamic parameters for last 24 hours:    Intake/Output from previous day: 12/23 0701 - 12/24 0700 In: 120 [P.O.:120] Out: -  Intake/Output this shift: No intake/output data recorded.  General appearance: alert, cooperative, and no distress Neurologic: intact Heart: sinus bradycardia Lungs: slightly diminished bibasilar breath sounds Abdomen: soft, non-tender; bowel sounds normal; no masses,  no organomegaly Extremities: extremities normal, atraumatic, no cyanosis or edema Wound: Clean and dry without sign of infection  Lab Results: Recent Labs    02/03/23 0209 02/04/23 0252  WBC 13.0* 10.4  HGB 11.2* 10.9*  HCT 32.8* 32.9*  PLT 192 215   BMET:  Recent Labs    02/04/23 0252 02/05/23 0303  NA 134* 135  K 3.7 3.5  CL 100 101  CO2 24 24  GLUCOSE 95 105*  BUN 18 18  CREATININE 1.32* 1.56*  CALCIUM 8.0* 8.4*    PT/INR: No results for input(s): "LABPROT", "INR" in the last 72 hours. ABG    Component Value Date/Time   PHART 7.317 (L) 01/30/2023 1900   HCO3 24.3 01/30/2023 1900   TCO2 26 01/30/2023 1900   ACIDBASEDEF 2.0 01/30/2023 1900   O2SAT 94 01/30/2023 1900   CBG  (last 3)  Recent Labs    02/02/23 0754 02/02/23 1205  GLUCAP 115* 87    Assessment/Plan: S/P Procedure(s) (LRB): MITRAL VALVE REPAIR (MVR) UTILIZING SIMULUS SEMI-RIGID ANNULOPLASTY BAND SIZE (N/A) TRANSESOPHAGEAL ECHOCARDIOGRAM (TEE) (N/A)  CV: Hx of junctional bradycardia appears resolved. Developed paroxysmal atrial fibrillation. On PO Amiodarone. Sinus bradycardia with some PVCs. EP was following for possible pacer but likely will not require one anymore, EP likely to see today. Not on a BB due to bradycardia. BP overall well controlled. EPW d/c'd yesterday. Will start Eliquis today for multiple episodes of afib and decrease ASA to 81mg .    Pulm: Saturating 91-95% on RA this AM. CXR with bibasilar atelectasis and small bilateral pleural effusions. As discussed with Dr. Dorris Fetch will hold off on thoracentesis and continue aggressive pulmonary hygiene. Encourage IS, flutter valve and ambulation.    GI: Loose stools last night. Will d/c stool softeners anf give Imodium as needed. Tolerating a diet.   Renal: Cr 1.56 was 1.32 yesterday, likely due to Lasix. Under preop weight but gave Lasix yesterday for pleural effusions. Will d/c Lasix today. K 3.5, supplement.    DVT Prophylaxis: Lovenox   Dispo: Pt is very eager to go home and is ambulating well. With slight bump in creatinine, wires removes yesterday and starting Eliquis today as well as loose stools would prefer to keep him for another  day but will discuss with the surgeon.   Addendum: As discussed with Dr. Dorris Fetch the patient is stable for d/c home today   LOS: 6 days    Jenny Reichmann, PA-C 02/05/2023

## 2023-02-05 NOTE — TOC Transition Note (Signed)
Transition of Care (TOC) - Discharge Note Donn Pierini RN, BSN Transitions of Care Unit 4E- RN Case Manager See Treatment Team for direct phone #   Patient Details  Name: Michael [name] MRN: [id number] Date of Birth: [birth year]  [facility]) CM/SW Contact:  Darrold Span, RN Phone Number: 02/05/2023, 2:27 PM   Clinical Narrative:    Pt stable for transition home today, TCTS office made [facility] referral to [facility]- liaison updated and will follow up with pt for any HH needs.  Family to transport home.   No further TOC needs noted.    Final next level of care: Home w Home Health Services Barriers to Discharge: Barriers Resolved   Patient Goals and CMS Choice Patient states their goals for this hospitalization and ongoing recovery are:: wants to recover CMS Medicare.gov Compare Post Acute Care list provided to:: Patient Represenative (must comment) Choice offered to / list presented to : Spouse      Discharge Placement                 Home w/ [facility]      Discharge Plan and Services Additional resources added to the After Visit Summary for     Discharge Planning Services: CM Consult Post Acute Care Choice: Home Health            DME Agency: NA       HH Arranged: RN, PT [facility] Agency: [facility] ([facility] Date HH Agency Contacted: 01/31/23 Time HH Agency Contacted: 1600 Representative spoke with at [facility] Agency: Clarise Cruz  Social Drivers of Health (SDOH) Interventions SDOH Screenings   Food Insecurity: No Food Insecurity (01/31/2023)  Housing: Low Risk  (01/31/2023)  Transportation Needs: No Transportation Needs (01/31/2023)  Utilities: Not At Risk (01/31/2023)  Tobacco Use: Medium Risk (01/30/2023)     Readmission Risk Interventions     No data to display

## 2023-02-05 NOTE — Progress Notes (Signed)
   Ordered 6 week post-op Echo following mitral valve repair and PFO closure per protocol. Will need a structural reader.  Primary Cardiologist - Dr. Elease Hashimoto CT Surgeon - Dr. Debarah Crape, PA-C 02/05/2023 9:11 AM

## 2023-02-05 NOTE — Progress Notes (Signed)
Discharge instructions reviewed with pt.  Copy of instructions given to pt. Pt informed his scripts were sent to his pharmacy to pick up and will need today.  (Pt's wife here at the hospital, went to the cafeteria per pt, instructions reviewed with pt--pt ready to go when wife returns to the unit). Pt will be  d/c'd via wheelchair with belongings, with his wife and will be escorted by hospital volunteer/staff.   Annice Needy, RN SWOT

## 2023-02-05 NOTE — Progress Notes (Signed)
   Telemetry reviewed shows NSR 50-60s and PVCs.   No indication for pacing at this time.   Please call back with questions or concerns.   Casimiro Needle [address]" [place], PA-C  02/05/2023 7:30 AM

## 2023-02-05 NOTE — Progress Notes (Signed)
No questions or concerns. Pt has already been referred for CR at [facility]

## 2023-02-12 ENCOUNTER — Telehealth (HOSPITAL_COMMUNITY): Payer: Self-pay

## 2023-02-12 NOTE — Telephone Encounter (Signed)
Called patient to see if he is interested in the Cardiac Rehab Program. Patient expressed interest. Explained scheduling process and went over insurance, patient verbalized understanding. Will contact patient for scheduling once f/u has been completed.  °

## 2023-02-14 MED FILL — Sodium Chloride IV Soln 0.9%: INTRAVENOUS | Qty: 2000 | Status: AC

## 2023-02-14 MED FILL — Mannitol IV Soln 20%: INTRAVENOUS | Qty: 500 | Status: AC

## 2023-02-14 MED FILL — Electrolyte-R (PH 7.4) Solution: INTRAVENOUS | Qty: 4000 | Status: AC

## 2023-02-14 MED FILL — Sodium Bicarbonate IV Soln 8.4%: INTRAVENOUS | Qty: 50 | Status: AC

## 2023-02-14 MED FILL — Heparin Sodium (Porcine) Inj 1000 Unit/ML: INTRAMUSCULAR | Qty: 10 | Status: AC

## 2023-02-20 ENCOUNTER — Other Ambulatory Visit: Payer: Self-pay | Admitting: Thoracic Surgery (Cardiothoracic Vascular Surgery)

## 2023-02-20 DIAGNOSIS — I340 Nonrheumatic mitral (valve) insufficiency: Secondary | ICD-10-CM

## 2023-02-21 ENCOUNTER — Ambulatory Visit (INDEPENDENT_AMBULATORY_CARE_PROVIDER_SITE_OTHER): Payer: Self-pay | Admitting: Physician Assistant

## 2023-02-21 ENCOUNTER — Ambulatory Visit
Admission: RE | Admit: 2023-02-21 | Discharge: 2023-02-21 | Disposition: A | Discharge status: Home / Self Care | Payer: Medicare Other | Source: Ambulatory Visit | Attending: Thoracic Surgery (Cardiothoracic Vascular Surgery) | Admitting: Thoracic Surgery (Cardiothoracic Vascular Surgery)

## 2023-02-21 VITALS — BP 109/71 | HR 72 | Resp 20 | Wt 216.8 lb

## 2023-02-21 DIAGNOSIS — I340 Nonrheumatic mitral (valve) insufficiency: Secondary | ICD-10-CM

## 2023-02-21 DIAGNOSIS — Z9889 Other specified postprocedural states: Secondary | ICD-10-CM

## 2023-02-21 NOTE — Progress Notes (Signed)
 HPI:  Michael [name] is a 73 year old gentleman past history of hypertension, obstructive sleep apnea, dyslipidemia, and obesity.  He was referred to Dr. Maryjane for management of severe mitral insufficiency secondary to mitral valve prolapse.  He was admitted for elective surgery on 01/30/2023 had repair of the mitral valve with triangular resection of P1 and placement of a 30 mm [employer]onic Simulus annuloplasty band.  Perioperatively, he had an episode of ventricular tachycardia and was started on amiodarone  infusion.-year-old, he developed a junctional rhythm so the amiodarone  was discontinued.  Developed atrial fibrillation, he was started back on the amiodarone  with conversion back to sinus rhythm.  He was discharged on oral amiodarone .  His postoperative course was otherwise uneventful. Patient returns for routine postoperative follow-up.  Since hospital discharge the patient reports steady progress with his recovery.  He denies any shortness of breath.  He has some mild soreness but is not requiring any pain medicine or even Tylenol  at this point.  He has not had any further palpitations and has been taking the amiodarone  follow-up tapering dose as prescribed so that he is now taking 200 mg once daily.   Current Outpatient Medications  Medication Sig Dispense Refill   acetaminophen  (TYLENOL ) 325 MG tablet Take 2 tablets (650 mg total) by mouth every 6 (six) hours as needed.     amiodarone  (PACERONE ) 200 MG tablet Take 2 tabs twice per day for 7 days, then take 1 tab twice per day for 7 days, then take 1 tab daily thereafter 60 tablet 1   apixaban  (ELIQUIS ) 5 MG TABS tablet Take 1 tablet (5 mg total) by mouth 2 (two) times daily. 60 tablet 1   aspirin  81 MG tablet Take 81 mg by mouth daily.     B Complex CAPS Take 1 capsule by mouth daily.     cholecalciferol (VITAMIN D) 1000 UNITS tablet Take 1,000 Units by mouth daily.     FIBER COMPLETE PO Take 2 tablets by mouth daily.     fluticasone  (FLONASE) 50 MCG/ACT nasal spray Place 2 sprays into both nostrils daily as needed for allergies.     loratadine  (CLARITIN ) 10 MG tablet Take 10 mg by mouth daily as needed for allergies.     NON FORMULARY Pt uses a cpap nightly     pantoprazole  (PROTONIX ) 40 MG tablet Take 40 mg by mouth daily.     sertraline  (ZOLOFT ) 50 MG tablet Take 50 mg by mouth daily.     simvastatin  (ZOCOR ) 20 MG tablet Take 20 mg by mouth at bedtime.     tamsulosin  (FLOMAX ) 0.4 MG CAPS capsule Take 0.4 mg by mouth every evening.     traMADol  (ULTRAM ) 50 MG tablet Take 1 tablet (50 mg total) by mouth every 6 (six) hours as needed for moderate pain (pain score 4-6). 28 tablet 0   No current facility-administered medications for this visit.    Physical Exam: Vital signs BP 109/71 Heart rate 72 Respirations 20 SpO2 96% on room air  General: Michael [name] is in no distress and is walking with a steady gait.  He is accompanied by his wife today. Heart: Regular rate and rhythm, no murmur Chest: Sternotomy incision is well-healed.  The sternum was stable.  Breath sounds are full, equal, and clear to auscultation.  The chest x-ray shows complete resolution of the bibasilar volume loss/effusions.  Lung fields are clear the wires are properly aligned.  Extremities: No peripheral edema    Diagnostic Tests: Narrative &  Impression  CLINICAL DATA:  Mitral valve replacement. Follow-up. Surgery 2 weeks ago.   EXAM: CHEST - 2 VIEW   COMPARISON:  Chest two views 02/04/2023   FINDINGS: Status post median sternotomy and mitral valve replacement. Cardiac silhouette and mediastinal contours are within normal limits. Possible trace left pleural fluid but marked decrease in the prior small left pleural effusion. Resolution of the prior mild-to-moderate right pleural effusion. Minimal left basilar linear subsegmental atelectasis. No pneumothorax. Mild-to-moderate multilevel degenerative disc changes of the thoracic spine.    IMPRESSION: 1. Status post median sternotomy and mitral valve replacement. At least marked decrease in and possible resolution of the prior small left pleural effusion. Resolution of the prior mild-to-moderate right pleural effusion. 2. Minimal left basilar linear subsegmental atelectasis.     Electronically Signed   By: Tanda Lyons M.D.   On: 02/21/2023 15:00    Impression / Plan: Michael [name] is making a progressive and satisfactory recovery following mitral valve repair for severe MR mitral valve prolapse.  He is pleased with his progress.  He has had no apparent recurrence of his atrial fibrillation.  I think he should continue with the oral amiodarone  200 mg once daily for at least another month.  Otherwise, no changes to his medications from CT surgery standpoint.  We discussed activity management and sternal precautions.  Will plan follow-up with Dr. Maryjane in 3 to 4 weeks.   Zian Mohamed G. Jeanean Hollett, PA-C [facility] [phone]

## 2023-02-21 NOTE — Patient Instructions (Signed)
 No change in medications from CT surgery standpoint. We will plan on stopping the amiodarone  in another month if you had no evidence of recurrence of the atrial fibrillation.  You may gradually increase your activity but continue to observe sternal precautions with no lifting, pushing, or pulling greater than 10 pounds for another month.  Will schedule follow-up with Dr. Maryjane in 3 to 4 weeks.

## 2023-03-04 ENCOUNTER — Encounter: Payer: Self-pay | Admitting: Cardiovascular Disease

## 2023-03-04 NOTE — Progress Notes (Signed)
  Appt cancelled due to weather    This encounter was created in error - please disregard.

## 2023-03-06 ENCOUNTER — Ambulatory Visit: Payer: Medicare Other | Admitting: Cardiovascular Disease

## 2023-03-07 ENCOUNTER — Encounter: Payer: Self-pay | Admitting: Cardiovascular Disease

## 2023-03-07 ENCOUNTER — Telehealth: Payer: Self-pay | Admitting: Cardiovascular Disease

## 2023-03-07 NOTE — Telephone Encounter (Signed)
My Chart message to patient that the appointment has been canceled.

## 2023-03-07 NOTE — Telephone Encounter (Signed)
Patient would like to have no-showed 1/22 appointment status changed as the appointment was cancelled by the office.

## 2023-03-13 ENCOUNTER — Ambulatory Visit (HOSPITAL_COMMUNITY): Payer: Medicare Other | Attending: Cardiovascular Disease

## 2023-03-13 DIAGNOSIS — Z952 Presence of prosthetic heart valve: Secondary | ICD-10-CM | POA: Insufficient documentation

## 2023-03-13 DIAGNOSIS — Z48812 Encounter for surgical aftercare following surgery on the circulatory system: Secondary | ICD-10-CM | POA: Diagnosis not present

## 2023-03-13 DIAGNOSIS — I340 Nonrheumatic mitral (valve) insufficiency: Secondary | ICD-10-CM | POA: Diagnosis not present

## 2023-03-13 DIAGNOSIS — Z9889 Other specified postprocedural states: Secondary | ICD-10-CM | POA: Insufficient documentation

## 2023-03-13 DIAGNOSIS — G4730 Sleep apnea, unspecified: Secondary | ICD-10-CM | POA: Diagnosis not present

## 2023-03-13 DIAGNOSIS — F17200 Nicotine dependence, unspecified, uncomplicated: Secondary | ICD-10-CM | POA: Diagnosis not present

## 2023-03-13 LAB — ECHOCARDIOGRAM COMPLETE
Area-P 1/2: 1.27 cm2
MV VTI: 1.8 cm2
S' Lateral: 3.58 cm

## 2023-03-13 NOTE — Progress Notes (Unsigned)
[address]       [place] [address]             [phone]           ATWOOD ADC[facility] [place] Medical Record #[id number] Date of Birth: 1951/01/20  Michael Ran, MD Michael Ran, MD  Chief Complaint:   SP MV repair  History of Present Illness:     Pt now 5 weeks out from complex MV repair complicated with some afib post op. He has been home and doing well. He still has not seen Cardiology. Echo yesterday with good valve function. EF 45%. Pt active without DOE. Sternum not painful. Has not experienced any palpitations at home      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 and moderate to severe mitral regurgitation   MVP (mitral valve prolapse)    with moderate MR per echo in 2009   Normal cardiac stress test 02/2012   OSA on CPAP 11/11/2013   Sleep apnea    wears CPAP   Tinnitus    Tobacco abuse    Tobacco use disorder 03/31/2014   White coat hypertension     Past Surgical History:  Procedure Laterality Date   APPENDECTOMY  02/13/2004   COLONOSCOPY     INGUINAL HERNIA REPAIR Right    MITRAL VALVE REPAIR N/A 01/30/2023   Procedure: MITRAL VALVE REPAIR (MVR) UTILIZING SIMULUS SEMI-RIGID ANNULOPLASTY BAND SIZE ;  Surgeon: Eugenio Hoes, MD;  Location: [facility] OR;  Service: Open Heart Surgery;  Laterality: N/A;   RIGHT/LEFT HEART CATH AND CORONARY ANGIOGRAPHY N/A  12/10/2022   Procedure: RIGHT/LEFT HEART CATH AND CORONARY ANGIOGRAPHY;  Surgeon: Orbie Pyo, MD;  Location: [facility] INVASIVE CV LAB;  Service: Cardiovascular;  Laterality: N/A;   ROTATOR CUFF REPAIR Left 02/12/2005   TEE WITHOUT CARDIOVERSION N/A 11/14/2022   Procedure: TRANSESOPHAGEAL ECHOCARDIOGRAM;  Surgeon: Parke Poisson, MD;  Location: [facility] INVASIVE CV LAB;  Service: Cardiovascular;  Laterality: N/A;   TEE WITHOUT CARDIOVERSION N/A 01/30/2023   Procedure: TRANSESOPHAGEAL ECHOCARDIOGRAM (TEE);  Surgeon: Eugenio Hoes, MD;  Location: [facility] OR;  Service: Open Heart Surgery;  Laterality: N/A;    Social History   Tobacco Use  Smoking Status Former   Current packs/day: 0.00   Average packs/day: 0.5 packs/day for 44.9 years (22.5 ttl pk-yrs)   Types: Cigarettes   Start date: 04/12/1967   Quit date: 03/06/2012   Years since quitting: 11.0  Smokeless Tobacco Never  Tobacco Comments   Quit smoking cigarettes 1.5 years ago as of 01/28/23    Social History   Substance and Sexual Activity  Alcohol Use No   Alcohol/week: 0.0 standard drinks of alcohol    Social History   Socioeconomic History   Marital status: Married  Spouse name: Not on file   Number of children: 0   Years of education: HS   Highest education level: Not on file  Occupational History   Not on file  Tobacco Use   Smoking status: Former    Current packs/day: 0.00    Average packs/day: 0.5 packs/day for 44.9 years (22.5 ttl pk-yrs)    Types: Cigarettes    Start date: 04/12/1967    Quit date: 03/06/2012    Years since quitting: 11.0   Smokeless tobacco: Never   Tobacco comments:    Quit smoking cigarettes 1.5 years ago as of 01/28/23  Vaping Use   Vaping status: Every Day  Substance and Sexual Activity   Alcohol use: No    Alcohol/week: 0.0 standard drinks of alcohol   Drug use: No   Sexual activity: Yes  Other Topics Concern   Not on file  Social History Narrative   Patient is married, does not have  children.   Patient is right handed.   Patient has high school education.   Patient does not drink caffeine.   Social Drivers of Corporate investment banker Strain: Not on file  Food Insecurity: No Food Insecurity (01/31/2023)   Hunger Vital Sign    Worried About Running Out of Food in the Last Year: Never true    [name] Out of Food in the Last Year: Never true  Transportation Needs: No Transportation Needs (01/31/2023)   PRAPARE - Administrator, Civil Service (Medical): No    Lack of Transportation (Non-Medical): No  Physical Activity: Not on file  Stress: Not on file  Social Connections: Not on file  Intimate Partner Violence: Not At Risk (01/31/2023)   Humiliation, Afraid, Rape, and Kick questionnaire    Fear of Current or Ex-Partner: No    Emotionally Abused: No    Physically Abused: No    Sexually Abused: No    No Known Allergies  Current Outpatient Medications  Medication Sig Dispense Refill   acetaminophen (TYLENOL) 325 MG tablet Take 2 tablets (650 mg total) by mouth every 6 (six) hours as needed.     apixaban (ELIQUIS) 5 MG TABS tablet Take 1 tablet (5 mg total) by mouth 2 (two) times daily. 60 tablet 1   aspirin 81 MG tablet Take 81 mg by mouth daily.     B Complex CAPS Take 1 capsule by mouth daily.     cholecalciferol (VITAMIN D) 1000 UNITS tablet Take 1,000 Units by mouth daily.     FIBER COMPLETE PO Take 2 tablets by mouth daily.     fluticasone (FLONASE) 50 MCG/ACT nasal spray Place 2 sprays into both nostrils daily as needed for allergies.     loratadine (CLARITIN) 10 MG tablet Take 10 mg by mouth daily as needed for allergies.     NON FORMULARY Pt uses a cpap nightly     pantoprazole (PROTONIX) 40 MG tablet Take 40 mg by mouth daily.     sertraline (ZOLOFT) 50 MG tablet Take 50 mg by mouth daily.     simvastatin (ZOCOR) 20 MG tablet Take 20 mg by mouth at bedtime.     tamsulosin (FLOMAX) 0.4 MG CAPS capsule Take 0.4 mg by mouth every evening.      No current facility-administered medications for this visit.     Family History  Problem Relation Age of Onset   Cancer Mother    Cancer Other 53       PANCREATIC CANCER  Hypertension Other    Sleep apnea Brother        CPAP       Physical Exam: Lungs: clear Card: RR without murmur Ext: Warm no edema Incision well healed     Diagnostic Studies & Laboratory data: I have personally reviewed the following studies and agree with the findings     Recent Radiology Findings:       Recent Lab Findings: Lab Results  Component Value Date   WBC 10.4 02/04/2023   HGB 10.9 (L) 02/04/2023   HCT 32.9 (L) 02/04/2023   PLT 215 02/04/2023   GLUCOSE 105 (H) 02/05/2023   ALT 22 01/28/2023   AST 25 01/28/2023   NA 135 02/05/2023   K 3.5 02/05/2023   CL 101 02/05/2023   CREATININE 1.56 (H) 02/05/2023   BUN 18 02/05/2023   CO2 24 02/05/2023   INR 1.3 (H) 01/30/2023   HGBA1C 5.3 01/28/2023      Assessment / Plan:     Doing well sp mv repair. Will stop amiodarone and eliquis. Will stay on ASA. Restrictions reviewed. Will follow up prn   I have spent 30 min in review of the records, viewing studies and in face to face with patient and in coordination of future care    Eugenio Hoes 03/13/2023 1:13 PM

## 2023-03-14 ENCOUNTER — Ambulatory Visit: Payer: Self-pay | Admitting: Thoracic Surgery (Cardiothoracic Vascular Surgery)

## 2023-03-14 ENCOUNTER — Other Ambulatory Visit: Payer: Self-pay

## 2023-03-14 ENCOUNTER — Encounter: Payer: Self-pay | Admitting: Thoracic Surgery (Cardiothoracic Vascular Surgery)

## 2023-03-14 ENCOUNTER — Telehealth: Payer: Self-pay

## 2023-03-14 VITALS — BP 123/81 | HR 62 | Resp 20 | Wt 218.4 lb

## 2023-03-14 DIAGNOSIS — Z9889 Other specified postprocedural states: Secondary | ICD-10-CM

## 2023-03-14 NOTE — Telephone Encounter (Signed)
-----   Message from Eugenio Hoes sent at 03/14/2023  1:53 PM EST ----- Regarding: RE: OK to go to dental cleaning Yes he can ----- Message ----- From: Steve Rattler, RN Sent: 03/14/2023   1:52 PM EST To: Eugenio Hoes, MD Subject: OK to go to dental cleaning                    Hey,  He forgot to ask while in there, is he good to go to the dentist for dental cleaning soon? Appt is 2/5. He is aware of prophylactic antibiotics.   Thanks, Morrie Sheldon

## 2023-03-14 NOTE — Telephone Encounter (Signed)
Called patient to make aware he can go to dental cleanings now. Appt 2/5.

## 2023-03-14 NOTE — Patient Instructions (Signed)
Stop amiodarone.  Stop eliquis.

## 2023-03-14 NOTE — Progress Notes (Signed)
Referral placed to [facility] outpatient Cardiac Rehab per Dr. Karolee Ohs orders.

## 2023-03-18 ENCOUNTER — Encounter: Payer: Self-pay | Admitting: Cardiovascular Disease

## 2023-03-19 ENCOUNTER — Ambulatory Visit: Payer: Medicare Other | Attending: Cardiovascular Disease | Admitting: Cardiovascular Disease

## 2023-03-19 ENCOUNTER — Encounter: Payer: Self-pay | Admitting: Cardiovascular Disease

## 2023-03-19 VITALS — BP 135/75 | HR 61 | Ht 71.0 in | Wt 222.6 lb

## 2023-03-19 DIAGNOSIS — I10 Essential (primary) hypertension: Secondary | ICD-10-CM

## 2023-03-19 DIAGNOSIS — Z9889 Other specified postprocedural states: Secondary | ICD-10-CM | POA: Diagnosis not present

## 2023-03-19 DIAGNOSIS — I340 Nonrheumatic mitral (valve) insufficiency: Secondary | ICD-10-CM | POA: Diagnosis not present

## 2023-03-19 MED ORDER — AMOXICILLIN 500 MG PO CAPS: ORAL_CAPSULE | ORAL | 0 refills | Status: DC

## 2023-03-19 NOTE — Patient Instructions (Signed)
 Medication Instructions:  Amoxicillin  500 mg - Take 4 tablets by mouth 1 hour prior to dental visit  *If you need a refill on your cardiac medications before your next appointment, please call your pharmacy*  Follow-Up: At [facility], you and your health needs are our priority.  As part of our continuing mission to provide you with exceptional heart care, we have created designated Provider Care Teams.  These Care Teams include your primary Cardiologist (physician) and Advanced Practice Providers (APPs -  Physician Assistants and Nurse Practitioners) who all work together to provide you with the care you need, when you need it.  We recommend signing up for the patient portal called MyChart.  Sign up information is provided on this After Visit Summary.  MyChart is used to connect with patients for Virtual Visits (Telemedicine).  Patients are able to view lab/test results, encounter notes, upcoming appointments, etc.  Non-urgent messages can be sent to your provider as well.   To learn more about what you can do with MyChart, go to [email or web address].    Your next appointment:   6 month(s)  Provider:   Aleene Passe, MD     Other Instructions   1st Floor: - Lobby - Registration  - Pharmacy  - Lab - Cafe  2nd Floor: - PV Lab - Diagnostic Testing (echo, CT, nuclear med)  3rd Floor: - Vacant  4th Floor: - TCTS (cardiothoracic surgery) - AFib Clinic - Structural Heart Clinic - Vascular Surgery  - Vascular Ultrasound  5th Floor: - HeartCare Cardiology (general and EP) - Clinical Pharmacy for coumadin, hypertension, lipid, weight-loss medications, and med management appointments    Valet parking services will be available as well.

## 2023-03-19 NOTE — Progress Notes (Signed)
 Cardiology Office Note:    Date:  03/19/2023   ID:  Michael [name], DOB May 25, 1950, MRN [id number]  PCP:  Shayne Anes, MD   [facility] Providers Cardiologist:   Kamiyah Kindel   Electrophysiologist:  OLE ONEIDA HOLTS, MD     Referring MD: Shayne Anes, MD   Chief Complaint  Patient presents with   Mitral Regurgitation   Hypertension          Aug. 25, 2022    Michael [name] is a 73 y.o. male with a hx of MVP , OSA, and bradycardia. He has seen Dr. Holts for follow up of his bradycardia. Is active,  Exercises regularly Retired from [employer]   Has MVP wit mod - severe MR now  Was mild MR on his last echo in 2014 Has mild LAE  Encourage weight loss , BP control  Was on bystolic  but this caused bradycardia  Dr. Shayne tried Zetia - did not tolerate it  Is on simva I think he may do better on rosuvastatin  since Simvastatin  has so many drug interactions .   Will defer to Dr. Shayne on this issue for now .  No worsening dyspnea Drinks lots of regular soda .  We discussed weight loss  September 26, 2021:  There is seen today for follow-up of his moderate to severe mitral regurgitation.   Hx of MVP   He has a history of hyperlipidemia and has been on simvastatin .  He has tried Zetia but did not tolerate it.  No dysnea Has stopped smoking 3 months ago   Lipid levels from his primary medical doctor in April, 2023 reveals a total cholesterol of 122 HDL is 35 LDL is 66 Triglyceride level is 104 Hemoglobin A1c is 5.5 Hemoglobin is 17.6.    Aug. 12, 2024 Michael [name] is seen for follow up of his moderate - severe MR , MVP  Hx of endocarditis in the past  Last echo was 1 year Walks regularly ,  no additional dyspnea    Oct. 22, 2024 Michael [name] is seen for folllow up of his severe MR  TEE reveals severe MR Needs R and L heart cath   Has DOE , Is active , works on his on [place]    Jan. 21, 2025  [facility] / ice delay  Patient rescheduled   Feb. 4, 2025  [name]  is seen for follow up of his MVP / MR He had elective MV repair by Dr. Maryjane  BP is elevated  Still eating lots of exercise  He has not yet started cardiac rehab.  They are waiting on my clearance.   Michael [name] is cleared to start cardiac rehab.  I encouraged him to go with the rehab program at least for several months.  Gave him SBE prophylaxis     Past Medical History:  Diagnosis Date   Anal fissure    Anxiety    Aortic atherosclerosis (HCC) 04/03/2021   CT in 6/21   Cancer [facility]) 2022   Skin Cancer left ear   Convulsions (HCC)    AS A CHILD   Coronary artery calcification seen on CT scan 04/03/2021   Chest CT in 6/21   Diverticulitis    Heart murmur    Hiatal hernia with gastroesophageal reflux    History of kidney stones    HLD (hyperlipidemia)    Moderate to severe mitral regurgitation 10/06/2020   Echo 09/2021: EF 60-65, no RWMA, mild LVH, GRII DD, elevated LVEDP, normal RVSF,  normal PASP, severe LAE, 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 and moderate to severe mitral regurgitation   MVP (mitral valve prolapse)    with moderate MR per echo in 2009   Normal cardiac stress test 02/2012   OSA on CPAP 11/11/2013   Sleep apnea    wears CPAP   Tinnitus    Tobacco abuse    Tobacco use disorder 03/31/2014   White coat hypertension     Past Surgical History:  Procedure Laterality Date   APPENDECTOMY  02/13/2004   COLONOSCOPY     INGUINAL HERNIA REPAIR Right    MITRAL VALVE REPAIR N/A 01/30/2023   Procedure: MITRAL VALVE REPAIR (MVR) UTILIZING SIMULUS SEMI-RIGID ANNULOPLASTY BAND SIZE ;  Surgeon: [name], MD;  [facility] OR;  Service: Open Heart Surgery;  Laterality: N/A;   RIGHT/LEFT HEART CATH AND CORONARY ANGIOGRAPHY N/A 12/10/2022   Procedure: RIGHT/LEFT HEART CATH AND CORONARY ANGIOGRAPHY;  Surgeon: [name], MD;  Location: [facility] INVASIVE CV LAB;  Service: Cardiovascular;  Laterality: N/A;   ROTATOR CUFF REPAIR Left 02/12/2005    TEE WITHOUT CARDIOVERSION N/A 11/14/2022   Procedure: TRANSESOPHAGEAL ECHOCARDIOGRAM;  Surgeon: [name], MD;  Location: [facility] INVASIVE CV LAB;  Service: Cardiovascular;  Laterality: N/A;   TEE WITHOUT CARDIOVERSION N/A 01/30/2023   Procedure: TRANSESOPHAGEAL ECHOCARDIOGRAM (TEE);  Surgeon: [name], MD;  Location: [facility] OR;  Service: Open Heart Surgery;  Laterality: N/A;    Current Medications: Current Meds  Medication Sig   amoxicillin  (AMOXIL ) 500 MG capsule Take 4 tablets by mouth 1 hour prior to dental visit   aspirin  81 MG tablet Take 81 mg by mouth daily.   B Complex CAPS Take 1 capsule by mouth daily.   cholecalciferol (VITAMIN D) 1000 UNITS tablet Take 1,000 Units by mouth daily.   FIBER COMPLETE PO Take 2 tablets by mouth daily.   fluticasone (FLONASE) 50 MCG/ACT nasal spray Place 2 sprays into both nostrils daily as needed for allergies.   loratadine  (CLARITIN ) 10 MG tablet Take 10 mg by mouth daily as needed for allergies.   NON FORMULARY Pt uses a cpap nightly   pantoprazole  (PROTONIX ) 40 MG tablet Take 40 mg by mouth daily.   sertraline  (ZOLOFT ) 50 MG tablet Take 50 mg by mouth daily.   simvastatin  (ZOCOR ) 20 MG tablet Take 20 mg by mouth at bedtime.   tamsulosin  (FLOMAX ) 0.4 MG CAPS capsule Take 0.4 mg by mouth every evening.     Allergies:   Patient has no known allergies.   Social History   Socioeconomic History   Marital status: Married    Spouse name: Not on file   Number of children: 0   Years of education: HS   Highest education level: Not on file  Occupational History   Not on file  Tobacco Use   Smoking status: Former    Current packs/day: 0.00    Average packs/day: 0.5 packs/day for 44.9 years (22.5 ttl pk-yrs)    Types: Cigarettes    Start date: 04/12/1967    Quit date: 03/06/2012    Years since quitting: 11.0   Smokeless tobacco: Never   Tobacco comments:    Quit smoking cigarettes 1.5 years ago as of 01/28/23  Vaping Use   Vaping  status: Every Day  Substance and Sexual Activity   Alcohol use: No    Alcohol/week: 0.0 standard drinks of alcohol   Drug use: No   Sexual activity: Yes  Other Topics Concern  Not on file  Social History Narrative   Patient is married, does not have children.   Patient is right handed.   Patient has high school education.   Patient does not drink caffeine.   Social Drivers of Corporate Investment Banker Strain: Not on file  Food Insecurity: No Food Insecurity (01/31/2023)   Hunger Vital Sign    Worried About Running Out of Food in the Last Year: Never true    Ran Out of Food in the Last Year: Never true  Transportation Needs: No Transportation Needs (01/31/2023)   PRAPARE - Administrator, Civil Service (Medical): No    Lack of Transportation (Non-Medical): No  Physical Activity: Not on file  Stress: Not on file  Social Connections: Not on file     Family History: The patient's family history includes Cancer in his mother; Cancer (age of onset: 33) in an other family member; Hypertension in an other family member; Sleep apnea in his brother.  ROS:   Please see the history of present illness.     All other systems reviewed and are negative.  EKGs/Labs/Other Studies Reviewed:    The following studies were reviewed today:     Recent Labs: 01/28/2023: ALT 22 02/04/2023: Hemoglobin 10.9; Platelets 215 02/05/2023: BUN 18; Creatinine, Ser 1.56; Magnesium  1.9; Potassium 3.5; Sodium 135  Recent Lipid Panel No results found for: CHOL, TRIG, HDL, CHOLHDL, VLDL, LDLCALC, LDLDIRECT   Risk Assessment/Calculations:           Physical Exam:     Physical Exam: Blood pressure 135/75, pulse 61, height 5' 11 (1.803 m), weight 222 lb 9.6 oz (101 kg), SpO2 96%.       GEN:  Well nourished, well developed in no acute distress HEENT: Normal NECK: No JVD; No carotid bruits LYMPHATICS: No lymphadenopathy CARDIAC: RRR soft systolic murmur ,  sternotomy is healing well..  RESPIRATORY:  Clear to auscultation without rales, wheezing or rhonchi  ABDOMEN: Soft, non-tender, non-distended MUSCULOSKELETAL:  No edema; No deformity  SKIN: Warm and dry NEUROLOGIC:  Alert and oriented x 3    EKG:        ASSESSMENT:    1. S/P mitral valve repair       PLAN:      MVP with mod- severe MR.      He is status post mitral valve repair.  Overall doing well.   2.  Hyperlipidemia:    3.  Moderate obesity:    4.  Mild hypertension: Continue current medications.  He still eats a fairly high salt diet.  I encouraged him to reduce his salt intake, especially his bacon intake.        Medication Adjustments/Labs and Tests Ordered: Current medicines are reviewed at length with the patient today.  Concerns regarding medicines are outlined above.  No orders of the defined types were placed in this encounter.  Meds ordered this encounter  Medications   amoxicillin  (AMOXIL ) 500 MG capsule    Sig: Take 4 tablets by mouth 1 hour prior to dental visit    Dispense:  16 capsule    Refill:  0    Patient Instructions  Medication Instructions:  Amoxicillin  500 mg - Take 4 tablets by mouth 1 hour prior to dental visit  *If you need a refill on your cardiac medications before your next appointment, please call your pharmacy*  Follow-Up: At [facility], you and your health needs are our priority.  As part of  our continuing mission to provide you with exceptional heart care, we have created designated Provider Care Teams.  These Care Teams include your primary Cardiologist (physician) and Advanced Practice Providers (APPs -  Physician Assistants and Nurse Practitioners) who all work together to provide you with the care you need, when you need it.  We recommend signing up for the patient portal called MyChart.  Sign up information is provided on this After Visit Summary.  MyChart is used to connect with patients for Virtual  Visits (Telemedicine).  Patients are able to view lab/test results, encounter notes, upcoming appointments, etc.  Non-urgent messages can be sent to your provider as well.   To learn more about what you can do with MyChart, go to [email or web address].    Your next appointment:   6 month(s)  Provider:   Aleene Passe, MD     Other Instructions   1st Floor: - Lobby - Registration  - Pharmacy  - Lab - Cafe  2nd Floor: - PV Lab - Diagnostic Testing (echo, CT, nuclear med)  3rd Floor: - Vacant  4th Floor: - TCTS (cardiothoracic surgery) - AFib Clinic - Structural Heart Clinic - Vascular Surgery  - Vascular Ultrasound  5th Floor: - HeartCare Cardiology (general and EP) - Clinical Pharmacy for coumadin, hypertension, lipid, weight-loss medications, and med management appointments    Valet parking services will be available as well.

## 2023-03-20 ENCOUNTER — Other Ambulatory Visit: Payer: Self-pay | Admitting: Physician Assistant

## 2023-03-28 ENCOUNTER — Telehealth (HOSPITAL_COMMUNITY): Payer: Self-pay

## 2023-03-28 NOTE — Telephone Encounter (Signed)
Called and spoke with pt in regards to CR, pt stated he is not interested at this time.   Closed referral

## 2023-08-08 NOTE — Progress Notes (Signed)
  Electrophysiology Office Follow up Visit Note:    Date:  08/09/2023   ID:  Michael [name], DOB 10/12/1950, MRN [id number]  PCP:  Shayne Anes, MD  [facility] Cardiologist:  Aleene Passe, MD  [facility] Electrophysiologist:  OLE ONEIDA HOLTS, MD    Interval History:     Michael [name] is a 73 y.o. male who presents for a follow up visit.   I last saw the patient Jul 05, 2020 for bradycardia.  His bradycardia improved after stopping Nebivolol .  At the last appointment with me I did not think there was an indication for permanent pacing.  He had a mitral valve repair with Dr. Maryjane in December 2024.  He is recovering well from that surgery.  He does have some chest wall pain mostly on the right side that is positional at times.  He saw Dr. Passe March 19, 2023.  He was doing well.      Past medical, surgical, social and family history were reviewed.  ROS:   Please see the history of present illness.    All other systems reviewed and are negative.  EKGs/Labs/Other Studies Reviewed:    The following studies were reviewed today:          Physical Exam:    VS:  BP 120/60   Pulse (!) 50   Ht 5' 11 (1.803 m)   Wt 230 lb (104.3 kg)   SpO2 96%   BMI 32.08 kg/m     Wt Readings from Last 3 Encounters:  08/09/23 230 lb (104.3 kg)  03/19/23 222 lb 9.6 oz (101 kg)  03/14/23 218 lb 6.4 oz (99.1 kg)     GEN: no distress CARD: RRR, No MRG RESP: No IWOB. CTAB.      ASSESSMENT:    1. Sinus bradycardia    PLAN:    In order of problems listed above:  #Sinus bradycardia Stable No indication for permanent pacing at this time  #Mitral valve regurgitation Doing well after mitral valve repair Appears well compensated  Follow-up with EP on an as-needed basis   Signed, OLE HOLTS, MD, [facility], [facility] 08/09/2023 11:50 AM    Electrophysiology [facility]

## 2023-08-09 ENCOUNTER — Ambulatory Visit: Attending: Cardiology | Admitting: Cardiology

## 2023-08-09 ENCOUNTER — Encounter: Payer: Self-pay | Admitting: Cardiology

## 2023-08-09 VITALS — BP 120/60 | HR 50 | Ht 71.0 in | Wt 230.0 lb

## 2023-08-09 DIAGNOSIS — R001 Bradycardia, unspecified: Secondary | ICD-10-CM

## 2023-08-09 NOTE — Patient Instructions (Signed)

## 2023-08-13 ENCOUNTER — Other Ambulatory Visit: Payer: Self-pay | Admitting: Internal Medicine

## 2023-08-13 DIAGNOSIS — F17200 Nicotine dependence, unspecified, uncomplicated: Secondary | ICD-10-CM

## 2023-08-20 ENCOUNTER — Ambulatory Visit
Admission: RE | Admit: 2023-08-20 | Discharge: 2023-08-20 | Disposition: A | Discharge status: Home / Self Care | Source: Ambulatory Visit | Attending: Internal Medicine | Admitting: Internal Medicine

## 2023-08-20 DIAGNOSIS — F17200 Nicotine dependence, unspecified, uncomplicated: Secondary | ICD-10-CM

## 2023-09-16 ENCOUNTER — Ambulatory Visit: Attending: Cardiology | Admitting: Cardiology

## 2023-09-16 ENCOUNTER — Other Ambulatory Visit (HOSPITAL_COMMUNITY): Payer: Self-pay

## 2023-09-16 ENCOUNTER — Encounter: Payer: Self-pay | Admitting: Cardiology

## 2023-09-16 VITALS — BP 148/80 | HR 55 | Ht 71.0 in | Wt 233.0 lb

## 2023-09-16 DIAGNOSIS — I10 Essential (primary) hypertension: Secondary | ICD-10-CM

## 2023-09-16 DIAGNOSIS — I341 Nonrheumatic mitral (valve) prolapse: Secondary | ICD-10-CM

## 2023-09-16 DIAGNOSIS — E782 Mixed hyperlipidemia: Secondary | ICD-10-CM | POA: Diagnosis not present

## 2023-09-16 MED ORDER — AMLODIPINE BESYLATE 5 MG PO TABS
5.0000 mg | ORAL_TABLET | Freq: Every day | ORAL | 1 refills | Status: DC
  Filled 2023-09-16: qty 90, 90d supply, fill #0

## 2023-09-16 NOTE — Patient Instructions (Signed)
 Medication Instructions:  Amlodipine  5 mg daily  *If you need a refill on your cardiac medications before your next appointment, please call your pharmacy*   Follow-Up: At [facility], you and your health needs are our priority.  As part of our continuing mission to provide you with exceptional heart care, our providers are all part of one team.  This team includes your primary Cardiologist (physician) and Advanced Practice Providers or APPs (Physician Assistants and Nurse Practitioners) who all work together to provide you with the care you need, when you need it.  Your next appointment:   1 year(s)  Provider:   Newman JINNY Lawrence, MD

## 2023-09-16 NOTE — Progress Notes (Signed)
 Cardiology Office Note:  .   Date:  09/16/2023  ID:  Michael [name], DOB 1950/03/14, MRN [id number] PCP: Shayne Anes, MD  [facility] Providers Cardiologist:  Newman Lawrence, MD PCP: Shayne Anes, MD  Chief Complaint  Patient presents with   MR   Hypertension     Michael [name] is a 73 y.o. male with hypertension, hyperlipidemia, OSA on CPAP, s/p mitral valve repair for mitral regurgitation  History of Present Illness  Patient remained mitral repair for severe mitral regurgitation in 01/2023.  He is doing well.  Denies any complaints of chest pain or shortness of breath.  Blood pressure is elevated today.  On further questioning, it appears that patient's blood pressure always stays elevated around this range.  He is currently not on any antihypertensive therapy.  He does not drink excessive amounts of caffeine or alcohol, avoid salt, stays active.  He is already on CPAP for OSA.  No acute reversible cause found.     Vitals:   09/16/23 0810  BP: (!) 148/80  Pulse: (!) 55  SpO2: 95%      Review of Systems  Cardiovascular:  Negative for chest pain, dyspnea on exertion, leg swelling, palpitations and syncope.        Studies Reviewed: SABRA        Independently interpreted 06/2023: Chol 135, TG 130, HDL 38, LDL 71 HbA1C 5.4%  Labs 01/2023: Hb 10.9 Cr 1.56, eGFR 47 HbA1C 5.3%  Coronary angiography 11/2022:   Ost Cx lesion is 20% stenosed.   1.  Minimal obstructive coronary artery disease of right dominant system. 2.  Fick cardiac output of 4.5 L/min and Fick cardiac index of 2.9 L/min/m with a heart following hemodynamics:             Right atrial pressure mean of 10 mmHg             Right ventricular pressure 35/2 with end-diastolic pressure of 12 mmHg             Right wedge pressure of 12 mmHg with V waves to 15 mmHg             Left wedge pressure of 10 mmHg with V waves to 14 mmHg             Right PA pressure of 32/10 with a mean of 16 mmHg              Left PA pressure of 28/15 with a mean of 20 mmHg             PVR of around 1 Wood units             PA pulsatility index of 2.2 3.  LVEDP of 12 mmHg.    Mitral valve repair (01/2023): Complex mitral valve repair with triangular resection of P1 with placement of a 30mm Simulus semi ridig band   Echocardiogram 02/2023:  1. Left ventricular ejection fraction, by estimation, is 45 to 50%. Left  ventricular ejection fraction by 3D volume is 45 %. The left ventricle has  mildly decreased function. The left ventricle has no regional wall motion  abnormalities. Left ventricular  diastolic parameters are indeterminate.   2. Right ventricular systolic function is normal. The right ventricular  size is normal. There is normal pulmonary artery systolic pressure. The  estimated right ventricular systolic pressure is 22.5 mmHg.   3. Left atrial size was mildly dilated.   4. The mitral valve has  been repaired/replaced. Mild mitral valve  regurgitation. No evidence of mitral stenosis. The mean mitral valve  gradient is 3.0 mmHg with average heart rate of 59 bpm. There is a 30 mm  prosthetic annuloplasty ring present in the  mitral position. Procedure Date: 01/30/23. Echo findings are consistent  with normal structure and function of the mitral valve prosthesis.   5. The aortic valve is tricuspid. There is mild calcification of the  aortic valve. Aortic valve regurgitation is trivial. No aortic stenosis is  present.   6. The inferior vena cava is normal in size with greater than 50%  respiratory variability, suggesting right atrial pressure of 3 mmHg.     Physical Exam Vitals and nursing note reviewed.  Constitutional:      General: He is not in acute distress. Neck:     Vascular: No JVD.  Cardiovascular:     Rate and Rhythm: Normal rate and regular rhythm.     Heart sounds: Normal heart sounds. No murmur heard. Pulmonary:     Effort: Pulmonary effort is normal.     Breath sounds:  Normal breath sounds. No wheezing or rales.  Musculoskeletal:     Right lower leg: No edema.     Left lower leg: No edema.      VISIT DIAGNOSES:   ICD-10-CM   1. Primary hypertension  I10     2. Mixed hyperlipidemia  E78.2     3. MVP (mitral valve prolapse)  I34.1        Michael [name] is a 73 y.o. male with hypertension, hyperlipidemia, OSA on CPAP, s/p mitral valve repair for mitral regurgitation Assessment & Plan  Hypertension: Uncontrolled. No acutely reversible risk factor. Started on amlodipine  5 mg daily.  Follow-up with Dr. Mathews in 2 months as scheduled.  Mitral regurgitation: Resolved after mitral valve repair in 01/2023. Continue aspirin  81 mg daily. Will need antibiotic prophylaxis for dental procedures.  Mixed hyperlipidemia: Well-controlled on simvastatin  20 mg daily. Given that he is only on 20 mg simvastatin , he should tolerate amlodipine  5 mg daily without any significant concern for myalgias.   Meds ordered this encounter  Medications   amLODipine  (NORVASC ) 5 MG tablet    Sig: Take 1 tablet (5 mg total) by mouth daily.    Dispense:  90 tablet    Refill:  1     F/u in 1 year  Signed, Newman JINNY Lawrence, MD

## 2023-11-25 NOTE — Patient Instructions (Signed)

## 2023-11-25 NOTE — Progress Notes (Unsigned)
   PATIENT: Michael [name] DOB: 1950/09/11  REASON FOR VISIT: follow up HISTORY FROM: patient  Virtual Visit via MyChart video  I connected with [name] on 11/26/23 at  9:45 AM EDT via MyChart video and verified that I am speaking with the correct person using two identifiers.   I discussed the limitations, risks, security and privacy concerns of performing an evaluation and management service by Mychart video and the availability of in person appointments. I also discussed with the patient that there may be a patient responsible charge related to this service. The patient expressed understanding and agreed to proceed.   History of Present Illness:  11/26/23 ALL (MyChart): Michael [name] is a 73 y.o. male here today for follow up.he continues to do well on therapy. He is using CPAP most every night for about 9 hours, on average. He denies concerns with machine or supplies. He is resting well. He uses his old machine when staying in [place]. Spending most of [facility] at [facility].      Observations/Objective:  Generalized: Well developed, in no acute distress  Mentation: Alert oriented to time, place, history taking. Follows all commands speech and language fluent   Assessment and Plan:  73 y.o. year old male  has a past medical history of Anal fissure, Anxiety, Aortic atherosclerosis (04/03/2021), Cancer (HCC) (2022), Convulsions (HCC), Coronary artery calcification seen on CT scan (04/03/2021), Diverticulitis, Heart murmur, Hiatal hernia with gastroesophageal reflux, History of kidney stones, HLD (hyperlipidemia), Moderate to severe mitral regurgitation (10/06/2020), MVP (mitral valve prolapse), Normal cardiac stress test (02/2012), OSA on CPAP (11/11/2013), Sleep apnea, Tinnitus, Tobacco abuse, Tobacco use disorder (03/31/2014), and White coat hypertension. here with    ICD-10-CM   1. OSA on CPAP  G47.33 For home use only DME continuous positive airway pressure (CPAP)       [name] continues to do well on therapy. Compliance report shows excellent compliance. He was encouraged to continue CPAP nightly for at least 4 hours. Supply orders updated. Healthy lifestyle habits encouraged. He will follow up with me in 1 year, sooner if needed.   Orders Placed This Encounter  Procedures   For home use only DME continuous positive airway pressure (CPAP)    Heated Humidity with all supplies as needed    Length of Need:   Lifetime    Patient has OSA or probable OSA:   Yes    Is the patient currently using CPAP in the home:   Yes    Settings:   Other see comments    CPAP supplies needed:   Mask, headgear, cushions, filters, heated tubing and water chamber    No orders of the defined types were placed in this encounter.    Follow Up Instructions:  I discussed the assessment and treatment plan with the patient. The patient was provided an opportunity to ask questions and all were answered. The patient agreed with the plan and demonstrated an understanding of the instructions.   The patient was advised to call back or seek an in-person evaluation if the symptoms worsen or if the condition fails to improve as anticipated.  I provided 15 minutes of face-to-face and non face-to-face time during this MyChart video encounter. Patient located at their place of residence. Provider is in the office.    [name], NP

## 2023-11-26 ENCOUNTER — Encounter: Payer: Self-pay | Admitting: Family Medicine

## 2023-11-26 ENCOUNTER — Telehealth: Payer: Medicare Other | Admitting: Family Medicine

## 2023-11-26 DIAGNOSIS — G4733 Obstructive sleep apnea (adult) (pediatric): Secondary | ICD-10-CM

## 2023-12-09 ENCOUNTER — Telehealth: Payer: Self-pay | Admitting: Cardiology

## 2023-12-09 MED ORDER — AMLODIPINE BESYLATE 5 MG PO TABS: 5.0000 mg | ORAL_TABLET | Freq: Every day | ORAL | 3 refills | Status: AC

## 2023-12-09 NOTE — Telephone Encounter (Signed)
 Sent in refill

## 2023-12-09 NOTE — Telephone Encounter (Signed)
*  STAT* If patient is at the pharmacy, call can be transferred to refill team.   1. Which medications need to be refilled? (please list name of each medication and dose if known) amLODipine (NORVASC) 5 MG tablet    2. Would you like to learn more about the convenience, safety, & potential cost savings by using the [facility]?   3. Are you open to using the [facility] (Type [facility].      4. Which pharmacy/location (including street and city if local pharmacy) is medication to be sent to?  [id number] - [place], [place] - [address]     5. Do they need a 30 day or 90 day supply? 90 day

## 2024-01-15 ENCOUNTER — Telehealth: Payer: Self-pay

## 2024-01-15 NOTE — Telephone Encounter (Signed)
   Pre-operative Risk Assessment    Patient Name: Michael [name]  DOB: Feb 24, 1950 MRN: [id number]   Date of last office visit: 09/16/23  Dr. Elmira Date of next office visit: NA   Request for Surgical Clearance    Procedure:  Robotic Incisional hernia repair with Mesh and with bilateral myofascial release of rectus muscle  Date of Surgery:  Clearance TBD                               Surgeon:  Herlene Bureau, MD Surgeon's Group or Practice Name:  [facility] Phone number:  [phone] Fax number:  [phone]  attn: Larraine Ellen   Type of Clearance Requested:   - Medical  - Pharmacy:  Hold Aspirin      Type of Anesthesia:  General    Additional requests/questions:    [name]   01/15/2024, 5:04 PM

## 2024-01-16 ENCOUNTER — Encounter: Payer: Self-pay | Admitting: Physician Assistant

## 2024-01-16 ENCOUNTER — Ambulatory Visit: Attending: Physician Assistant | Admitting: Physician Assistant

## 2024-01-16 VITALS — BP 128/74 | HR 61 | Ht 71.0 in | Wt 227.0 lb

## 2024-01-16 DIAGNOSIS — I10 Essential (primary) hypertension: Secondary | ICD-10-CM

## 2024-01-16 DIAGNOSIS — E782 Mixed hyperlipidemia: Secondary | ICD-10-CM | POA: Diagnosis not present

## 2024-01-16 DIAGNOSIS — Z01818 Encounter for other preprocedural examination: Secondary | ICD-10-CM

## 2024-01-16 DIAGNOSIS — Z9889 Other specified postprocedural states: Secondary | ICD-10-CM

## 2024-01-16 NOTE — Telephone Encounter (Signed)
 Pt has been scheduled appt today to see Scot Ford, [facility] 1:55 for preop clearance. Will update all parties involved.

## 2024-01-16 NOTE — Patient Instructions (Signed)
 Medication Instructions:  NO CHANGES *If you need a refill on your cardiac medications before your next appointment, please call your pharmacy*  Lab Work: NO LABS If you have labs (blood work) drawn today and your tests are completely normal, you will receive your results only by: MyChart Message (if you have MyChart) OR A paper copy in the mail If you have any lab test that is abnormal or we need to change your treatment, we will call you to review the results.  Testing/Procedures:[address] END OF [place] OR FEBRUARY 2026 Your physician has requested that you have an echocardiogram. Echocardiography is a painless test that uses sound waves to create images of your heart. It provides your doctor with information about the size and shape of your heart and how well your heart's chambers and valves are working. This procedure takes approximately one hour. There are no restrictions for this procedure. Please do NOT wear cologne, perfume, aftershave, or lotions (deodorant is allowed). Please arrive 15 minutes prior to your appointment time.  Please note: We ask at that you not bring children with you during ultrasound (echo/ vascular) testing. Due to room size and safety concerns, children are not allowed in the ultrasound rooms during exams. Our front office staff cannot provide observation of children in our lobby area while testing is being conducted. An adult accompanying a patient to their appointment will only be allowed in the ultrasound room at the discretion of the ultrasound technician under special circumstances. We apologize for any inconvenience.   Follow-Up: At [facility], you and your health needs are our priority.  As part of our continuing mission to provide you with exceptional heart care, our providers are all part of one team.  This team includes your primary Cardiologist (physician) and Advanced Practice Providers or APPs (Physician Assistants and Nurse  Practitioners) who all work together to provide you with the care you need, when you need it.  Your next appointment:   7 month(s)  Provider:   Newman JINNY Lawrence, MD     Other Instructions CLEARED FOR PROCEDURE. OK TO HOLD ASPIRIN  5-7 DAYS PRIOR

## 2024-01-16 NOTE — Telephone Encounter (Signed)
   Name: Michael [name]  DOB: 11-25-1950  MRN: [id number]  Primary Cardiologist: Michael JINNY Perry Brucato, MD  Chart reviewed as part of pre-operative protocol coverage. Because of Michael [name]'s past medical history and time since last visit, he will require a follow-up in-office visit in order to better assess preoperative cardiovascular risk. Was hypertensive and medication added, amlodipine . Will need office visit for BP check and assessment prior to surgery on TBD, please.  Pre-op covering staff: - Please schedule appointment and call patient to inform them. If patient already had an upcoming appointment within acceptable timeframe, please add pre-op clearance to the appointment notes so provider is aware. - Please contact requesting surgeon's office via preferred method (i.e, phone, fax) to inform them of need for appointment prior to surgery.    Michael Satterfield, NP  01/16/2024, 8:13 AM

## 2024-01-16 NOTE — Telephone Encounter (Addendum)
   Patient Name: Michael [name]  DOB: [birth year] MRN: [id number]  Primary Cardiologist: Newman JINNY Lawrence, MD  Chart reviewed as part of pre-operative protocol coverage. Given past medical history and time since last visit, based on ACC/AHA guidelines, Michael [name] is at acceptable risk for the planned procedure without further cardiovascular testing.  Previous cardiac catheterization performed in late 2024 showed minimal coronary plaque without any significant blockage.  Patient has been doing well since his valve repair.  He does not have significant heart murmur on exam.  He does not have any heart failure symptoms.  He can accomplish more than 4 METS of activity without any issue.  He is at acceptable risk to proceed with upcoming procedure from a cardiac perspective.  RCRI perioperative risk is 0.5 % of major perioperative cardiac complication risk.  If needed, he may hold aspirin  for 5 to 7 days prior to the procedure and restart as soon as possible afterward at the surgeon's discretion.  The patient was advised that if he develops new symptoms prior to surgery to contact our office to arrange for a follow-up visit, and he verbalized understanding.  I will route this recommendation to the requesting party via Epic fax function and remove from pre-op pool.  Please call with questions.  Jaline Pincock, [place] 01/16/2024, 2:23 PM

## 2024-01-16 NOTE — Progress Notes (Signed)
 Cardiology Office Note   Date:  01/16/2024  ID:  Michael [name], DOB 1950-02-20, MRN [id number] PCP: Shayne Anes, [name]  [facility] Providers Cardiologist:  Michael [name] Electrophysiologist:  Michael ONEIDA HOLTS, [name]     History of Present Illness Michael [name] is a 72 y.o. male with past medical history of hypertension, hyperlipidemia, history of mitral valve repair and OSA on CPAP.  Echocardiogram obtained on 10/24/2021 showed severe mitral regurgitation, EF 55 to 60%, normal RV, RVSP 18.1 mmHg, severe LAE.  Patient subsequently underwent TEE on 11/14/2022 which confirmed eccentrically anterior directed severe mitral regurgitation with prolapse of P1 scallop with flail segment, EF 55 to 60%.  Cardiac catheterization prior to valve surgery performed on 12/10/2022 showed 20% ostial left circumflex lesion, minimal disease in the RCA, cardiac output 4.5, cardiac index 2.9.  Patient ultimately underwent mitral valve repair utilizing 30 mm  Simulus semi-rigid annuloplasty. EP service was consulted for bradycardia who did not feel he required pacemaker implantation.  He did develop postop atrial fibrillation and was discharged on a short course of oral amiodarone .  Most recent echocardiogram obtained on 03/13/2023 showed EF 45 to 50%, no regional wall motion abnormality, normal RV, RVSP 22.5 mmHg, mild LAE, mild MR with 30 mm prosthetic annuloplasty ring present in the mitral position, trivial AI.  He has a history of bradycardia, this improved after stopping Nebivolol .  He was previously seen by Dr. Holts in June 2025 who did not feel there was indication for permanent pacing.   Patient is pending robotic incisional hernia repair with mesh was bilateral myofascial release of rectus muscle by Dr. Stevie of [facility] surgery.  Patient presents today for preop clearance accompanied by wife.  He denies any exertional chest pain or shortness of breath.  He has no limitations in the  clinic and clearly accomplish more than 4 METS of activity.  On physical exam, he does not have any significant heart murmur.  Last lipid panel obtained by PCP in May showed well-controlled cholesterol.  He is at acceptable risk to proceed with upcoming robotic incisional hernia repair.  He is due for a repeat echocardiogram either near the end of January or early February is a 1 year repeat echocardiogram after his mitral repair surgery.  Otherwise, he can follow-up with Dr. Lawrence around August of next year as previously recommended.  ROS:   He denies chest pain, palpitations, dyspnea, pnd, orthopnea, n, v, dizziness, syncope, edema, weight gain, or early satiety. All other systems reviewed and are otherwise negative except as noted above.    Studies Reviewed      Cardiac Studies & Procedures   ______________________________________________________________________________________________ CARDIAC CATHETERIZATION  CARDIAC CATHETERIZATION 12/10/2022  Conclusion   Ost Cx lesion is 20% stenosed.  1.  Minimal obstructive coronary artery disease of right dominant system. 2.  Fick cardiac output of 4.5 L/min and Fick cardiac index of 2.9 L/min/m with a heart following hemodynamics: Right atrial pressure mean of 10 mmHg Right ventricular pressure 35/2 with end-diastolic pressure of 12 mmHg Right wedge pressure of 12 mmHg with V waves to 15 mmHg Left wedge pressure of 10 mmHg with V waves to 14 mmHg Right PA pressure of 32/10 with a mean of 16 mmHg Left PA pressure of 28/15 with a mean of 20 mmHg PVR of around 1 Wood units PA pulsatility index of 2.2 3.  LVEDP of 12 mmHg.  Recommendation: Continue evaluation for mitral valve intervention.  Findings Coronary Findings Diagnostic  Dominance: Right  Left Anterior Descending The vessel exhibits minimal luminal irregularities.  Left Circumflex Ost Cx lesion is 20% stenosed.  Right Coronary Artery The vessel exhibits minimal luminal  irregularities.  Intervention  No interventions have been documented.     ECHOCARDIOGRAM  ECHOCARDIOGRAM COMPLETE 03/13/2023  Narrative ECHOCARDIOGRAM REPORT    Patient Name:   Michael [name] Hodapp Date of Exam: 03/13/2023 Medical Rec #:  [id number]       Height:       71.0 in Accession #:    [id number]      Weight:       216.8 lb Date of Birth:  05-26-1950       BSA:          2.182 m Patient Age:    72 years        BP:           125/80 mmHg Patient Gender: M               HR:           60 bpm. Exam Location:  [address]  Procedure: 2D Echo, 3D Echo, Cardiac Doppler, Color Doppler and Strain Analysis  Indications:    S/P Mitral Valve repair Z98.89  History:        Patient has prior history of Echocardiogram examinations, most recent 01/30/2023. Mitral Valve Prolapse and Mitral Valve Disease; Risk Factors:Dyslipidemia, Sleep Apnea and Current Smoker.  Mitral Valve: 30 mm prosthetic annuloplasty ring valve is present in the mitral position. Procedure Date: 01/30/23.  Sonographer:    Lauraine Pilot RDCS Referring Phys: [phone] PHILIP J NAHSER  IMPRESSIONS   1. Left ventricular ejection fraction, by estimation, is 45 to 50%. Left ventricular ejection fraction by 3D volume is 45 %. The left ventricle has mildly decreased function. The left ventricle has no regional wall motion abnormalities. Left ventricular diastolic parameters are indeterminate. 2. Right ventricular systolic function is normal. The right ventricular size is normal. There is normal pulmonary artery systolic pressure. The estimated right ventricular systolic pressure is 22.5 mmHg. 3. Left atrial size was mildly dilated. 4. The mitral valve has been repaired/replaced. Mild mitral valve regurgitation. No evidence of mitral stenosis. The mean mitral valve gradient is 3.0 mmHg with average heart rate of 59 bpm. There is a 30 mm prosthetic annuloplasty ring present in the mitral position. Procedure Date: 01/30/23. Echo  findings are consistent with normal structure and function of the mitral valve prosthesis. 5. The aortic valve is tricuspid. There is mild calcification of the aortic valve. Aortic valve regurgitation is trivial. No aortic stenosis is present. 6. The inferior vena cava is normal in size with greater than 50% respiratory variability, suggesting right atrial pressure of 3 mmHg.  FINDINGS Left Ventricle: Left ventricular ejection fraction, by estimation, is 45 to 50%. Left ventricular ejection fraction by 3D volume is 45 %. The left ventricle has mildly decreased function. The left ventricle has no regional wall motion abnormalities. Global longitudinal strain performed but not reported based on interpreter judgement due to suboptimal tracking. The left ventricular internal cavity size was normal in size. There is borderline left ventricular hypertrophy. Abnormal (paradoxical) septal motion consistent with post-operative status. Left ventricular diastolic parameters are indeterminate.  Right Ventricle: The right ventricular size is normal. No increase in right ventricular wall thickness. Right ventricular systolic function is normal. There is normal pulmonary artery systolic pressure. The tricuspid regurgitant velocity is 2.21 m/s, and with an assumed right atrial pressure of  3 mmHg, the estimated right ventricular systolic pressure is 22.5 mmHg.  Left Atrium: Left atrial size was mildly dilated.  Right Atrium: Right atrial size was normal in size.  Pericardium: There is no evidence of pericardial effusion.  Mitral Valve: The mitral valve has been repaired/replaced. Mild mitral valve regurgitation. There is a 30 mm prosthetic annuloplasty ring present in the mitral position. Procedure Date: 01/30/23. Echo findings are consistent with normal structure and function of the mitral valve prosthesis. No evidence of mitral valve stenosis. MV peak gradient, 6.8 mmHg. The mean mitral valve gradient is 3.0  mmHg with average heart rate of 59 bpm.  Tricuspid Valve: The tricuspid valve is normal in structure. Tricuspid valve regurgitation is trivial. No evidence of tricuspid stenosis.  Aortic Valve: The aortic valve is tricuspid. There is mild calcification of the aortic valve. Aortic valve regurgitation is trivial. No aortic stenosis is present.  Pulmonic Valve: The pulmonic valve was normal in structure. Pulmonic valve regurgitation is not visualized. No evidence of pulmonic stenosis.  Aorta: The aortic root is normal in size and structure.  Venous: The inferior vena cava is normal in size with greater than 50% respiratory variability, suggesting right atrial pressure of 3 mmHg.  IAS/Shunts: No atrial level shunt detected by color flow Doppler.   LEFT VENTRICLE PLAX 2D LVIDd:         5.44 cm LVIDs:         3.58 cm LV PW:         0.98 cm         3D Volume EF LV IVS:        0.94 cm         LV 3D EF:    Left LVOT diam:     2.05 cm                      ventricul LV SV:         84                           ar LV SV Index:   38                           ejection LVOT Area:     3.30 cm                     fraction by 3D volume is 45 %.  3D Volume EF: 3D EF:        45 % LV EDV:       196 ml LV ESV:       107 ml LV SV:        89 ml  RIGHT VENTRICLE RV S prime:     8.78 cm/s TAPSE (M-mode): 1.1 cm  LEFT ATRIUM             Index        RIGHT ATRIUM           Index LA diam:        5.69 cm 2.61 cm/m   RA Area:     14.50 cm LA Vol (A2C):   65.3 ml 29.93 ml/m  RA Volume:   36.80 ml  16.86 ml/m LA Vol (A4C):   67.4 ml 30.89 ml/m LA Biplane Vol: 70.0 ml 32.08 ml/m AORTIC VALVE LVOT Vmax:   125.00 cm/s LVOT Vmean:  79.700 cm/s LVOT VTI:    0.254 m  AORTA Ao Root diam: 3.48 cm Ao Asc diam:  3.61 cm  MITRAL VALVE                TRICUSPID VALVE MV Area (PHT): 1.27 cm     TR Peak grad:   19.5 mmHg MV Area VTI:   1.80 cm     TR Vmax:        221.00 cm/s MV Peak grad:  6.8  mmHg MV Mean grad:  3.0 mmHg     SHUNTS MV Vmax:       1.30 m/s     Systemic VTI:  0.25 m MV Vmean:      81.2 cm/s    Systemic Diam: 2.05 cm MV Decel Time: 599 msec MV E velocity: 97.20 cm/s MV A velocity: 128.00 cm/s MV E/A ratio:  0.76  Soyla Merck [name] Electronically signed by Soyla Merck [name] Signature Date/Time: 03/13/2023/3:07:38 PM    Final   TEE  ECHO INTRAOPERATIVE TEE 01/30/2023  Narrative *INTRAOPERATIVE TRANSESOPHAGEAL REPORT *    Patient Name:   Michael [name] Batte Date of Exam: 01/30/2023 Medical Rec #:  [id number]       Height:       71.0 in Accession #:    [id number]      Weight:       230.0 lb Date of Birth:  1950-12-08       BSA:          2.24 m Patient Age:    72 years        BP:           0/0 mmHg Patient Gender: M               HR:           94 bpm. Exam Location:  Inpatient  Transesophogeal exam was perform intraoperatively during surgical procedure. Patient was closely monitored under general anesthesia during the entirety of examination.  Indications:     Mitral regurgitation Performing Phys: Lynwood MARLA Cornea [name] Diagnosing Phys: Lynwood Cornea  Complications: No known complications during this procedure. POST-OP IMPRESSIONS _ Left Ventricle: has moderately reduced systolic function, with an ejection fraction of 35-40%. The wall motion is globally hypokinetic without significant regional wall motion abnormalities. _ Right Ventricle: The right ventricle appears unchanged from pre-bypass, with normal systolic function. _ Aorta: The aorta appears unchanged from pre-bypass; there is no dissection present in the aorta. _ Aortic Valve: The aortic valve appears unchanged from pre-bypass. There is trace regurgitation. _ Mitral Valve: Trace residual mitral regurgitation. Mean trans-mitral gradient 2 to . . The mitral valve is status post repair with an annuloplasty ring. _ Tricuspid Valve: The tricuspid valve appears unchanged from pre-bypass. There is  mild regurgitation. _ Pulmonic Valve: The pulmonic valve appears unchanged from pre-bypass. _ Interatrial Septum: The interatrial septum appears unchanged from pre-bypass. _ Pericardium: The pericardium appears unchanged from pre-bypass.  PRE-OP FINDINGS Left Ventricle: The left ventricle has normal systolic function, with an ejection fraction of 55-60%. The cavity size was normal.   Right Ventricle: The right ventricle has normal systolic function. The cavity was normal. There is no increase in right ventricular wall thickness.  Left Atrium: Left atrial size was not assessed. No left atrial/left atrial appendage thrombus was detected.  Right Atrium: Right atrial size was not assessed.  Interatrial Septum: No atrial level shunt detected by color flow Doppler.  Pericardium: Trivial pericardial effusion  is present. There is no pleural effusion.  Mitral Valve: Prolapse of the posterior leaflet at approximately the P1/P2 juncture. Mitral valve regurgitation is moderate by color flow Doppler. The MR jet is anteriorly-directed. There is no evidence of mitral stenosis.  Tricuspid Valve: The tricuspid valve was normal in structure. Tricuspid valve regurgitation is mild by color flow Doppler. No evidence of tricuspid stenosis is present.  Aortic Valve: The aortic valve is normal in structure. Aortic valve regurgitation was not visualized by color flow Doppler. There is no stenosis of the aortic valve.   Pulmonic Valve: The pulmonic valve was normal in structure No evidence of pumonic stenosis. Pulmonic valve regurgitation is trivial by color flow Doppler.   Aorta: The aortic root, ascending aorta and aortic arch are normal in size and structure.   Lynwood Cornea Electronically signed by Lynwood Cornea Signature Date/Time: 01/30/2023/1:51:28 PM    Final        ______________________________________________________________________________________________      Risk  Assessment/Calculations           Physical Exam VS:  BP 128/74 (BP Location: Right Arm, Patient Position: Sitting, Cuff Size: Large)   Pulse 61   Ht 5' 11 (1.803 m)   Wt 227 lb (103 kg)   SpO2 97%   BMI 31.66 kg/m        Wt Readings from Last 3 Encounters:  01/16/24 227 lb (103 kg)  09/16/23 233 lb (105.7 kg)  08/09/23 230 lb (104.3 kg)    GEN: Well nourished, well developed in no acute distress NECK: No JVD; No carotid bruits CARDIAC: RRR, no murmurs, rubs, gallops RESPIRATORY:  Clear to auscultation without rales, wheezing or rhonchi  ABDOMEN: Soft, non-tender, non-distended EXTREMITIES:  No edema; No deformity   ASSESSMENT AND PLAN  Preoperative clearance: Upcoming hernia repair.  Cardiac catheterization prior to the previous mitral valve repair showed minimal CAD.  Patient can clearly accomplish more than 4 METS of activity.  He is at acceptable risk to proceed with upcoming procedure.  I have forwarded the cardiac clearance letter to the surgeons office  History of mitral valve repair: Stable on last echocardiogram in January 2025.  Due for repeat echocardiogram early next year  Hypertension: Blood pressure well-controlled  Hyperlipidemia: On simvastatin  20 mg daily.       Dispo: Follow-up with Dr. Elmira around August of next year.  Signed, Scot Ford, PA

## 2024-02-26 ENCOUNTER — Ambulatory Visit: Payer: Self-pay | Admitting: General Surgery

## 2024-03-09 NOTE — Patient Instructions (Signed)
 SURGICAL WAITING ROOM VISITATION Patients having surgery or a procedure may have no more than 2 support people in the waiting area - these visitors may rotate.    Children under the age of 75 will not be allowed to visit due to the increase in respiratory illness  Children under the age of 48 must have an adult with them who is not the patient.  If the patient needs to stay at the hospital during part of their recovery, the visitor guidelines for inpatient rooms apply. Pre-op nurse will coordinate an appropriate time for 1 support person to accompany patient in pre-op.  This support person may not rotate.    Please refer to the [facility] website for the visitor guidelines for Inpatients (after your surgery is over and you are in a regular room).       Your procedure is scheduled on: 03-20-24   Report to [facility] Main Entrance    Report to admitting at 5:15 AM   Call this number if you have problems the morning of surgery [phone]   Do not eat food :After Midnight.   After Midnight you may have the following liquids until 4:30 AM DAY OF SURGERY  Water Non-Citrus Juices (without pulp, NO RED-Apple, White grape, White cranberry) Black Coffee (NO MILK/CREAM OR CREAMERS, sugar ok)  Clear Tea (NO MILK/CREAM OR CREAMERS, sugar ok) regular and decaf                             Plain Jell-O (NO RED)                                           Fruit ices (not with fruit pulp, NO RED)                                     Popsicles (NO RED)                                                               Sports drinks like Gatorade (NO RED)          If you have questions, please contact your surgeons office.   FOLLOW  ANY ADDITIONAL PRE OP INSTRUCTIONS YOU RECEIVED FROM YOUR SURGEON'S OFFICE!!!     Oral Hygiene is also important to reduce your risk of infection.                                    Remember - BRUSH YOUR TEETH THE MORNING OF SURGERY WITH YOUR REGULAR  TOOTHPASTE   Do NOT smoke after Midnight   Take these medicines the morning of surgery with A SIP OF WATER:    Amlodipine  (Norvasc )   Pantoprazole  (Protonix )   Sertraline  (Zoloft )    Stop all vitamins and herbal supplements 7 days before surgery  Hold Aspirin  one week prior to surgery  Bring CPAP mask and tubing day of surgery.  You may not have any metal on your body including, jewelry, and body piercing             Do not wear lotions, powders, cologne, or deodorant              Men may shave face and neck.   Do not bring valuables to the hospital. [facility] IS NOT RESPONSIBLE   FOR VALUABLES.   Contacts, dentures or bridgework may not be worn into surgery.   Bring small overnight bag day of surgery.   DO NOT BRING YOUR HOME MEDICATIONS TO THE HOSPITAL. PHARMACY WILL DISPENSE MEDICATIONS LISTED ON YOUR MEDICATION LIST TO YOU DURING YOUR ADMISSION IN THE HOSPITAL!   Special Instructions: Bring a copy of your healthcare power of attorney and living will documents the day of surgery if you haven't scanned them before.              Please read over the following fact sheets you were given: IF YOU HAVE QUESTIONS ABOUT YOUR PRE-OP INSTRUCTIONS PLEASE CALL [phone] G[place] or [phone]  If you received a COVID test during your pre-op visit  it is requested that you wear a mask when out in public, stay away from anyone that may not be feeling well and notify your surgeon if you develop symptoms. If you test positive for Covid or have been in contact with anyone that has tested positive in the last 10 days please notify you surgeon.   - Preparing for Surgery Before surgery, you can play an important role.  Because skin is not sterile, your skin needs to be as free of germs as possible.  You can reduce the number of germs on your skin by washing with CHG (chlorahexidine gluconate) soap before surgery.  CHG is an antiseptic cleaner which kills  germs and bonds with the skin to continue killing germs even after washing. Please DO NOT use if you have an allergy to CHG or antibacterial soaps.  If your skin becomes reddened/irritated stop using the CHG and inform your nurse when you arrive at Short Stay. Do not shave (including legs and underarms) for at least 48 hours prior to the first CHG shower.  You may shave your face/neck.  Please follow these instructions carefully:  1.  Shower with CHG Soap the night before surgery ONLY (DO NOT USE THE SOAP THE MORNING OF SURGERY).  2.  If you choose to wash your hair, wash your hair first as usual with your normal  shampoo.  3.  After you shampoo, rinse your hair and body thoroughly to remove the shampoo.                             4.  Use CHG as you would any other liquid soap.  You can apply chg directly to the skin and wash.  Gently with a scrungie or clean washcloth.  5.  Apply the CHG Soap to your body ONLY FROM THE NECK DOWN.   Do   not use on face/ open                           Wound or open sores. Avoid contact with eyes, ears mouth and   genitals (private parts).                       Wash face,  Medical Illustrator (private  parts) with your normal soap.             6.  Wash thoroughly, paying special attention to the area where your    surgery  will be performed.  7.  Thoroughly rinse your body with warm water from the neck down.  8.  DO NOT shower/wash with your normal soap after using and rinsing off the CHG Soap.                9.  Pat yourself dry with a clean towel.            10.  Wear clean pajamas.            11.  Place clean sheets on your bed the night of your first shower and do not  sleep with pets. Day of Surgery : Do not apply any CHG, lotions/deodorants the morning of surgery.  Please wear clean clothes to the hospital/surgery center.  FAILURE TO FOLLOW THESE INSTRUCTIONS MAY RESULT IN THE CANCELLATION OF YOUR SURGERY  PATIENT SIGNATURE_________________________________  NURSE  SIGNATURE__________________________________  ________________________________________________________________________

## 2024-03-10 NOTE — Progress Notes (Signed)
 Date of COVID positive in last 90 days:  PCP - Oneil Neth, MD Cardiologist - Newman Lawrence, MD Neurologist - Greig Forbes, NP  Cardiac clearance in Epic dated 01-16-24  Chest x-ray - CT chest 08-20-23 Epic EKG - 01-16-24 Epic Stress Test - 02-21-12 Epic ECHO - 03-13-23 Epic Cardiac Cath - 12-10-22 Epic Pacemaker/ICD device last checked:N/A Spinal Cord Stimulator:N/A  Bowel Prep - N/A  Sleep Study - Yes, +sleep apnea CPAP - Yes  Fasting Blood Sugar - N/A Checks Blood Sugar _____ times a day  Last dose of GLP1 agonist-  N/A GLP1 instructions:  Do not take after     Last dose of SGLT-2 inhibitors-  N/A SGLT-2 instructions:  Do not take after     Blood Thinner Instructions: N/A Last dose:   Time: Aspirin  Instructions:  ASA 81 Last Dose:  Activity level:  Can go up a flight of stairs and perform activities of daily living without stopping and without symptoms of chest pain or shortness of breath.  Able to exercise without symptoms  Unable to go up a flight of stairs without symptoms of     Anesthesia review: MV with hx of repair, mod to severe mitral regurg, HTN, OSA  Patient denies shortness of breath, fever, cough and chest pain at PAT appointment  Patient verbalized understanding of instructions that were given to them at the PAT appointment. Patient was also instructed that they will need to review over the PAT instructions again at home before surgery.

## 2024-03-12 ENCOUNTER — Other Ambulatory Visit: Payer: Self-pay

## 2024-03-12 ENCOUNTER — Encounter (HOSPITAL_COMMUNITY): Payer: Self-pay

## 2024-03-12 ENCOUNTER — Encounter (HOSPITAL_COMMUNITY)
Admission: RE | Admit: 2024-03-12 | Discharge: 2024-03-12 | Disposition: A | Discharge status: Home / Self Care | Source: Ambulatory Visit | Attending: General Surgery | Admitting: General Surgery

## 2024-03-12 VITALS — BP 136/77 | HR 56 | Temp 97.7°F | Resp 12 | Ht 71.0 in | Wt 221.8 lb

## 2024-03-12 DIAGNOSIS — K432 Incisional hernia without obstruction or gangrene: Secondary | ICD-10-CM | POA: Insufficient documentation

## 2024-03-12 DIAGNOSIS — N183 Chronic kidney disease, stage 3 unspecified: Secondary | ICD-10-CM | POA: Diagnosis not present

## 2024-03-12 DIAGNOSIS — I10 Essential (primary) hypertension: Secondary | ICD-10-CM

## 2024-03-12 DIAGNOSIS — I509 Heart failure, unspecified: Secondary | ICD-10-CM | POA: Diagnosis not present

## 2024-03-12 DIAGNOSIS — K219 Gastro-esophageal reflux disease without esophagitis: Secondary | ICD-10-CM | POA: Diagnosis not present

## 2024-03-12 DIAGNOSIS — Z01812 Encounter for preprocedural laboratory examination: Secondary | ICD-10-CM | POA: Diagnosis not present

## 2024-03-12 DIAGNOSIS — Z87891 Personal history of nicotine dependence: Secondary | ICD-10-CM | POA: Insufficient documentation

## 2024-03-12 DIAGNOSIS — G4733 Obstructive sleep apnea (adult) (pediatric): Secondary | ICD-10-CM | POA: Insufficient documentation

## 2024-03-12 DIAGNOSIS — K449 Diaphragmatic hernia without obstruction or gangrene: Secondary | ICD-10-CM | POA: Diagnosis not present

## 2024-03-12 DIAGNOSIS — F419 Anxiety disorder, unspecified: Secondary | ICD-10-CM | POA: Insufficient documentation

## 2024-03-12 DIAGNOSIS — M1990 Unspecified osteoarthritis, unspecified site: Secondary | ICD-10-CM | POA: Insufficient documentation

## 2024-03-12 DIAGNOSIS — Z01818 Encounter for other preprocedural examination: Secondary | ICD-10-CM | POA: Diagnosis present

## 2024-03-12 DIAGNOSIS — I2510 Atherosclerotic heart disease of native coronary artery without angina pectoris: Secondary | ICD-10-CM | POA: Insufficient documentation

## 2024-03-12 DIAGNOSIS — Z952 Presence of prosthetic heart valve: Secondary | ICD-10-CM | POA: Insufficient documentation

## 2024-03-12 DIAGNOSIS — I130 Hypertensive heart and chronic kidney disease with heart failure and stage 1 through stage 4 chronic kidney disease, or unspecified chronic kidney disease: Secondary | ICD-10-CM | POA: Insufficient documentation

## 2024-03-12 HISTORY — DX: Unspecified osteoarthritis, unspecified site: M19.90

## 2024-03-12 LAB — BASIC METABOLIC PANEL WITH GFR
Anion gap: 10 (ref 5–15)
BUN: 13 mg/dL (ref 8–23)
CO2: 25 mmol/L (ref 22–32)
Calcium: 9.8 mg/dL (ref 8.9–10.3)
Chloride: 107 mmol/L (ref 98–111)
Creatinine, Ser: 1.32 mg/dL — ABNORMAL HIGH (ref 0.61–1.24)
GFR, Estimated: 57 mL/min — ABNORMAL LOW
Glucose, Bld: 117 mg/dL — ABNORMAL HIGH (ref 70–99)
Potassium: 4.2 mmol/L (ref 3.5–5.1)
Sodium: 142 mmol/L (ref 135–145)

## 2024-03-12 LAB — CBC
HCT: 50.4 % (ref 39.0–52.0)
Hemoglobin: 16.1 g/dL (ref 13.0–17.0)
MCH: 30.3 pg (ref 26.0–34.0)
MCHC: 31.9 g/dL (ref 30.0–36.0)
MCV: 94.9 fL (ref 80.0–100.0)
Platelets: 241 10*3/uL (ref 150–400)
RBC: 5.31 MIL/uL (ref 4.22–5.81)
RDW: 14.4 % (ref 11.5–15.5)
WBC: 9 10*3/uL (ref 4.0–10.5)
nRBC: 0 % (ref 0.0–0.2)

## 2024-03-14 NOTE — Progress Notes (Signed)
 " Case: [id number] Date/Time: 03/20/24 0715   Procedure: REPAIR, HERNIA, VENTRAL, ROBOT-ASSISTED - ROBOTIC INCISIONAL HERNIA REPAIR WITH MESH BILATERAL MYOFASCIAL RELEASE OF RECTUS MUSCLE   Anesthesia type: General   Pre-op diagnosis: incisional hernia   Location: [facility] ROOM 05 / [facility]   Surgeons: Kinsinger, Herlene Righter, MD       DISCUSSION: Michael [name] is a 74 yo male with PMH of former smoking, MVP with severe MR s/p MVR (01/2023), CHF EF 45-50%, mild nonobstructive CAD (by cath in 2024), OSA on CPAP, GERD, hiatal hernia, CKD3, anxiety, arthritis.  Patient follows with Cardiology for severe MR s/p MVR in 01/2023. He developed post op A.fib and was on amiodarone  but has been weaned off. Has baseline bradycardia. Echocardiogram obtained on 03/13/2023 showed EF 45 to 50%, no regional wall motion abnormality, normal RV, RVSP 22.5 mmHg, mild LAE, mild MR with 30 mm prosthetic annuloplasty ring present in the mitral position, trivial AI. Last seen by PA Meng on 01/16/24 for pre op clearance. He denied any cardiac symptoms. Cleared for surgery:  Preoperative clearance: Upcoming hernia repair.  Cardiac catheterization prior to the previous mitral valve repair showed minimal CAD.  Patient can clearly accomplish more than 4 METS of activity.  He is at acceptable risk to proceed with upcoming procedure.  I have forwarded the cardiac clearance letter to the surgeons office   VS: BP 136/77   Pulse (!) 56   Temp 36.5 C (Oral)   Resp 12   Ht 5' 11 (1.803 m)   Wt 100.6 kg   SpO2 98%   BMI 30.93 kg/m   PROVIDERS: Shayne Anes, MD   LABS: Labs reviewed: Acceptable for surgery. CKD stable (all labs ordered are listed, but only abnormal results are displayed)  Labs Reviewed  BASIC METABOLIC PANEL WITH GFR - Abnormal; Notable for the following components:      Result Value   Glucose, Bld 117 (*)    Creatinine, Ser 1.32 (*)    GFR, Estimated 57 (*)    All other components within normal limits   CBC     CT Chest 08/20/23:  IMPRESSION: 1. Lung-RADS 2, benign appearance or behavior. Continue annual screening with low-dose chest CT without contrast in 12 months. 2. Tiny hiatal hernia. 3. Aortic Atherosclerosis (ICD10-I70.0) and Emphysema (ICD10-J43.9). Coronary artery atherosclerosis.   Echo 03/13/23:  IMPRESSIONS    1. Left ventricular ejection fraction, by estimation, is 45 to 50%. Left ventricular ejection fraction by 3D volume is 45 %. The left ventricle has mildly decreased function. The left ventricle has no regional wall motion abnormalities. Left ventricular  diastolic parameters are indeterminate.  2. Right ventricular systolic function is normal. The right ventricular size is normal. There is normal pulmonary artery systolic pressure. The estimated right ventricular systolic pressure is 22.5 mmHg.  3. Left atrial size was mildly dilated.  4. The mitral valve has been repaired/replaced. Mild mitral valve regurgitation. No evidence of mitral stenosis. The mean mitral valve gradient is 3.0 mmHg with average heart rate of 59 bpm. There is a 30 mm prosthetic annuloplasty ring present in the mitral position. Procedure Date: 01/30/23. Echo findings are consistent with normal structure and function of the mitral valve prosthesis.  5. The aortic valve is tricuspid. There is mild calcification of the aortic valve. Aortic valve regurgitation is trivial. No aortic stenosis is present.  6. The inferior vena cava is normal in size with greater than 50% respiratory variability, suggesting right atrial pressure of 3  mmHg. 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 and moderate to severe mitral regurgitation   MVP (mitral valve prolapse)    with moderate MR per echo in 2009   Normal cardiac stress test 02/2012   OSA on CPAP 11/11/2013   Sleep apnea    wears CPAP   Tinnitus    Tobacco abuse    Tobacco use disorder 03/31/2014   White coat hypertension     Past Surgical History:  Procedure Laterality Date   APPENDECTOMY  02/13/2004   COLONOSCOPY     INGUINAL HERNIA REPAIR Right    MITRAL VALVE REPAIR N/A 01/30/2023   Procedure: MITRAL VALVE REPAIR (MVR) UTILIZING SIMULUS SEMI-RIGID ANNULOPLASTY BAND SIZE ;  Surgeon: [name], MD;  Location: [facility] OR;  Service: Open Heart Surgery;  Laterality: N/A;   RIGHT/LEFT HEART CATH AND CORONARY ANGIOGRAPHY N/A 12/10/2022   Procedure: RIGHT/LEFT HEART CATH AND CORONARY ANGIOGRAPHY;  Surgeon: [name], MD;  Location: [facility]AS[facility];  Service: Cardiovascular;  Laterality: N/A;   ROTATOR CUFF REPAIR Left 02/12/2005   TEE WITHOUT CARDIOVERSION N/A 11/14/2022   Procedure: TRANSESOPHAGEAL ECHOCARDIOGRAM;  Surgeon: [name], MD;  Location: [facility]AS[facility];  Service: Cardiovascular;  Laterality: N/A;   TEE WITHOUT CARDIOVERSION N/A 01/30/2023   Procedure: TRANSESOPHAGEAL ECHOCARDIOGRAM (TEE);  Surgeon: [name], MD;  Location: [facility] OR;  Service: Open Heart Surgery;  Laterality: N/A;    MEDICATIONS:  amLODipine  (NORVASC ) 5 MG tablet   amoxicillin  (AMOXIL ) 500 MG capsule   aspirin  81 MG tablet   B Complex CAPS   Cholecalciferol (VITAMIN D ) 50 MCG (2000 UT) tablet   FIBER COMPLETE PO   NON FORMULARY   pantoprazole  (PROTONIX ) 40 MG tablet   sertraline  (ZOLOFT ) 50 MG tablet   simvastatin   (ZOCOR ) 20 MG tablet   tamsulosin  (FLOMAX ) 0.4 MG CAPS capsule   No current facility-administered medications for this encounter.   Burnard CHRISTELLA Odis DEVONNA [facility] Short Stay/Anesthesiology [facility] Phone [phone] 03/14/2024 1:52 PM        "

## 2024-03-14 NOTE — Anesthesia Preprocedure Evaluation (Signed)
 Anesthesia Evaluation    Airway        Dental   Pulmonary former smoker          Cardiovascular hypertension,      Neuro/Psych    GI/Hepatic   Endo/Other    Renal/GU      Musculoskeletal   Abdominal   Peds  Hematology   Anesthesia Other Findings   Reproductive/Obstetrics                              Anesthesia Physical Anesthesia Plan  ASA:   Anesthesia Plan:    Post-op Pain Management:    Induction:   PONV Risk Score and Plan:   Airway Management Planned:   Additional Equipment:   Intra-op Plan:   Post-operative Plan:   Informed Consent:   Plan Discussed with:   Anesthesia Plan Comments: (See PAT note from 12/9)         Anesthesia Quick Evaluation

## 2024-03-20 ENCOUNTER — Encounter (HOSPITAL_COMMUNITY)

## 2024-03-20 ENCOUNTER — Encounter (HOSPITAL_COMMUNITY): Payer: Self-pay | Admitting: General Surgery

## 2024-03-20 ENCOUNTER — Encounter (HOSPITAL_COMMUNITY)
Admission: RE | Disposition: A | Discharge status: Home / Self Care | Payer: Self-pay | Source: Ambulatory Visit | Attending: General Surgery

## 2024-03-20 ENCOUNTER — Observation Stay (HOSPITAL_COMMUNITY)
Admission: RE | Admit: 2024-03-20 | Discharge: 2024-03-20 | Disposition: A | Discharge status: Home / Self Care | Source: Ambulatory Visit | Attending: General Surgery | Admitting: General Surgery

## 2024-03-20 ENCOUNTER — Encounter (HOSPITAL_COMMUNITY): Admitting: Medical

## 2024-03-20 ENCOUNTER — Other Ambulatory Visit (HOSPITAL_COMMUNITY): Payer: Self-pay

## 2024-03-20 ENCOUNTER — Other Ambulatory Visit: Payer: Self-pay

## 2024-03-20 DIAGNOSIS — K432 Incisional hernia without obstruction or gangrene: Principal | ICD-10-CM | POA: Diagnosis present

## 2024-03-20 LAB — CBC
HCT: 47.1 % (ref 39.0–52.0)
Hemoglobin: 15.7 g/dL (ref 13.0–17.0)
MCH: 31.1 pg (ref 26.0–34.0)
MCHC: 33.3 g/dL (ref 30.0–36.0)
MCV: 93.3 fL (ref 80.0–100.0)
Platelets: 241 10*3/uL (ref 150–400)
RBC: 5.05 MIL/uL (ref 4.22–5.81)
RDW: 14.5 % (ref 11.5–15.5)
WBC: 13.3 10*3/uL — ABNORMAL HIGH (ref 4.0–10.5)
nRBC: 0 % (ref 0.0–0.2)

## 2024-03-20 LAB — CREATININE, SERUM
Creatinine, Ser: 1.33 mg/dL — ABNORMAL HIGH (ref 0.61–1.24)
GFR, Estimated: 56 mL/min — ABNORMAL LOW

## 2024-03-20 MED ORDER — TRAMADOL HCL 50 MG PO TABS: 50.0000 mg | ORAL_TABLET | Freq: Four times a day (QID) | ORAL | Status: DC | PRN

## 2024-03-20 MED ORDER — VITAMIN D 25 MCG (1000 UNIT) PO TABS: 2000.0000 [IU] | ORAL_TABLET | Freq: Every day | ORAL | Status: DC

## 2024-03-20 MED ORDER — CHLORHEXIDINE GLUCONATE 0.12 % MT SOLN
15.0000 mL | Freq: Once | OROMUCOSAL | Status: AC
  Administered 2024-03-20: 15 mL via OROMUCOSAL

## 2024-03-20 MED ORDER — SIMVASTATIN 20 MG PO TABS: 20.0000 mg | ORAL_TABLET | Freq: Every day | ORAL | Status: DC

## 2024-03-20 MED ORDER — SERTRALINE HCL 50 MG PO TABS: 50.0000 mg | ORAL_TABLET | Freq: Every day | ORAL | Status: DC

## 2024-03-20 MED ORDER — ACETAMINOPHEN 500 MG PO TABS: 1000.0000 mg | ORAL_TABLET | Freq: Four times a day (QID) | ORAL | Status: DC

## 2024-03-20 MED ORDER — BUPIVACAINE-EPINEPHRINE (PF) 0.25% -1:200000 IJ SOLN
INTRAMUSCULAR | Status: AC
  Filled 2024-03-20: qty 30

## 2024-03-20 MED ORDER — ASPIRIN 81 MG PO TBEC: 81.0000 mg | DELAYED_RELEASE_TABLET | Freq: Every day | ORAL | Status: DC

## 2024-03-20 MED ORDER — ONDANSETRON HCL 4 MG/2ML IJ SOLN
INTRAMUSCULAR | Status: AC
  Filled 2024-03-20: qty 2

## 2024-03-20 MED ORDER — DEXAMETHASONE SOD PHOSPHATE PF 10 MG/ML IJ SOLN
INTRAMUSCULAR | Status: AC
  Filled 2024-03-20: qty 1

## 2024-03-20 MED ORDER — HYDROMORPHONE HCL 1 MG/ML IJ SOLN: 0.5000 mg | INTRAMUSCULAR | Status: DC | PRN

## 2024-03-20 MED ORDER — ORAL CARE MOUTH RINSE: 15.0000 mL | Freq: Once | OROMUCOSAL | Status: AC

## 2024-03-20 MED ORDER — SUGAMMADEX SODIUM 200 MG/2ML IV SOLN
INTRAVENOUS | Status: AC
  Filled 2024-03-20: qty 2

## 2024-03-20 MED ORDER — TAMSULOSIN HCL 0.4 MG PO CAPS: 0.4000 mg | ORAL_CAPSULE | ORAL | Status: DC

## 2024-03-20 MED ORDER — EPHEDRINE SULFATE (PRESSORS) 25 MG/5ML IV SOSY
PREFILLED_SYRINGE | INTRAVENOUS | Status: DC | PRN
  Administered 2024-03-20 (×3): 5 mg via INTRAVENOUS

## 2024-03-20 MED ORDER — 0.9 % SODIUM CHLORIDE (POUR BTL) OPTIME
TOPICAL | Status: DC | PRN
  Administered 2024-03-20: 1000 mL

## 2024-03-20 MED ORDER — LIDOCAINE HCL (PF) 2 % IJ SOLN
INTRAMUSCULAR | Status: AC
  Filled 2024-03-20: qty 5

## 2024-03-20 MED ORDER — ACETAMINOPHEN 500 MG PO TABS
1000.0000 mg | ORAL_TABLET | ORAL | Status: AC
  Administered 2024-03-20: 1000 mg via ORAL
  Filled 2024-03-20: qty 2

## 2024-03-20 MED ORDER — AMLODIPINE BESYLATE 5 MG PO TABS: 5.0000 mg | ORAL_TABLET | Freq: Every day | ORAL | Status: DC

## 2024-03-20 MED ORDER — DIPHENHYDRAMINE HCL 50 MG/ML IJ SOLN: 12.5000 mg | Freq: Four times a day (QID) | INTRAMUSCULAR | Status: DC | PRN

## 2024-03-20 MED ORDER — BUPIVACAINE-EPINEPHRINE 0.25% -1:200000 IJ SOLN
INTRAMUSCULAR | Status: DC | PRN
  Administered 2024-03-20: 30 mL

## 2024-03-20 MED ORDER — MIDAZOLAM HCL 5 MG/5ML IJ SOLN
INTRAMUSCULAR | Status: DC | PRN
  Administered 2024-03-20: 1 mg via INTRAVENOUS

## 2024-03-20 MED ORDER — ROCURONIUM BROMIDE 100 MG/10ML IV SOLN
INTRAVENOUS | Status: DC | PRN
  Administered 2024-03-20: 100 mg via INTRAVENOUS
  Administered 2024-03-20: 10 mg via INTRAVENOUS

## 2024-03-20 MED ORDER — PHENYLEPHRINE HCL-NACL 20-0.9 MG/250ML-% IV SOLN
INTRAVENOUS | Status: DC | PRN
  Administered 2024-03-20: 25 ug/min via INTRAVENOUS

## 2024-03-20 MED ORDER — ENSURE SURGERY PO LIQD: 237.0000 mL | Freq: Two times a day (BID) | ORAL | Status: DC

## 2024-03-20 MED ORDER — ONDANSETRON HCL 4 MG/2ML IJ SOLN: 4.0000 mg | Freq: Four times a day (QID) | INTRAMUSCULAR | Status: DC | PRN

## 2024-03-20 MED ORDER — ENOXAPARIN SODIUM 40 MG/0.4ML IJ SOSY: 40.0000 mg | PREFILLED_SYRINGE | INTRAMUSCULAR | Status: DC

## 2024-03-20 MED ORDER — POLYETHYLENE GLYCOL 3350 17 G PO PACK: 17.0000 g | PACK | Freq: Every day | ORAL | Status: DC | PRN

## 2024-03-20 MED ORDER — CHLORHEXIDINE GLUCONATE CLOTH 2 % EX PADS: 6.0000 | MEDICATED_PAD | Freq: Once | CUTANEOUS | Status: DC

## 2024-03-20 MED ORDER — PANTOPRAZOLE SODIUM 40 MG PO TBEC: 40.0000 mg | DELAYED_RELEASE_TABLET | Freq: Every day | ORAL | Status: DC

## 2024-03-20 MED ORDER — ONDANSETRON HCL 4 MG/2ML IJ SOLN
INTRAMUSCULAR | Status: DC | PRN
  Administered 2024-03-20: 4 mg via INTRAVENOUS

## 2024-03-20 MED ORDER — LIDOCAINE HCL (CARDIAC) PF 100 MG/5ML IV SOSY
PREFILLED_SYRINGE | INTRAVENOUS | Status: DC | PRN
  Administered 2024-03-20: 100 mg via INTRAVENOUS

## 2024-03-20 MED ORDER — ROCURONIUM BROMIDE 10 MG/ML (PF) SYRINGE
PREFILLED_SYRINGE | INTRAVENOUS | Status: AC
  Filled 2024-03-20: qty 10

## 2024-03-20 MED ORDER — HYDROMORPHONE HCL 1 MG/ML IJ SOLN: 0.2500 mg | INTRAMUSCULAR | Status: DC | PRN

## 2024-03-20 MED ORDER — AMISULPRIDE (ANTIEMETIC) 5 MG/2ML IV SOLN: 10.0000 mg | Freq: Once | INTRAVENOUS | Status: DC | PRN

## 2024-03-20 MED ORDER — OXYCODONE HCL 5 MG/5ML PO SOLN: 5.0000 mg | Freq: Once | ORAL | Status: DC | PRN

## 2024-03-20 MED ORDER — DIPHENHYDRAMINE HCL 12.5 MG/5ML PO ELIX: 12.5000 mg | ORAL_SOLUTION | Freq: Four times a day (QID) | ORAL | Status: DC | PRN

## 2024-03-20 MED ORDER — CEFAZOLIN SODIUM-DEXTROSE 2-4 GM/100ML-% IV SOLN
2.0000 g | INTRAVENOUS | Status: AC
  Administered 2024-03-20: 2 g via INTRAVENOUS
  Filled 2024-03-20: qty 100

## 2024-03-20 MED ORDER — LACTATED RINGERS IV SOLN: INTRAVENOUS | Status: DC

## 2024-03-20 MED ORDER — SUGAMMADEX SODIUM 200 MG/2ML IV SOLN
INTRAVENOUS | Status: DC | PRN
  Administered 2024-03-20: 400 mg via INTRAVENOUS

## 2024-03-20 MED ORDER — DEXMEDETOMIDINE HCL IN NACL 80 MCG/20ML IV SOLN
INTRAVENOUS | Status: DC | PRN
  Administered 2024-03-20: 8 ug via INTRAVENOUS

## 2024-03-20 MED ORDER — EPHEDRINE 5 MG/ML INJ
INTRAVENOUS | Status: AC
  Filled 2024-03-20: qty 5

## 2024-03-20 MED ORDER — LIDOCAINE HCL 2 % IJ SOLN
INTRAMUSCULAR | Status: AC
  Filled 2024-03-20: qty 20

## 2024-03-20 MED ORDER — DEXAMETHASONE SOD PHOSPHATE PF 10 MG/ML IJ SOLN
INTRAMUSCULAR | Status: DC | PRN
  Administered 2024-03-20: 5 mg via INTRAVENOUS

## 2024-03-20 MED ORDER — PROPOFOL 10 MG/ML IV BOLUS
INTRAVENOUS | Status: AC
  Filled 2024-03-20: qty 20

## 2024-03-20 MED ORDER — OXYCODONE HCL 5 MG PO TABS
5.0000 mg | ORAL_TABLET | Freq: Four times a day (QID) | ORAL | 0 refills | Status: AC | PRN
  Filled 2024-03-20: qty 10, 3d supply, fill #0

## 2024-03-20 MED ORDER — OXYCODONE HCL 5 MG PO TABS: 5.0000 mg | ORAL_TABLET | Freq: Once | ORAL | Status: DC | PRN

## 2024-03-20 MED ORDER — FENTANYL CITRATE (PF) 100 MCG/2ML IJ SOLN
INTRAMUSCULAR | Status: DC | PRN
  Administered 2024-03-20 (×3): 50 ug via INTRAVENOUS
  Administered 2024-03-20: 25 ug via INTRAVENOUS
  Administered 2024-03-20: 50 ug via INTRAVENOUS
  Administered 2024-03-20: 25 ug via INTRAVENOUS

## 2024-03-20 MED ORDER — PROPOFOL 10 MG/ML IV BOLUS
INTRAVENOUS | Status: DC | PRN
  Administered 2024-03-20: 200 mg via INTRAVENOUS

## 2024-03-20 MED ORDER — B COMPLEX-C PO TABS
1.0000 | ORAL_TABLET | Freq: Every day | ORAL | Status: DC
  Filled 2024-03-20: qty 1

## 2024-03-20 MED ORDER — MIDAZOLAM HCL 2 MG/2ML IJ SOLN
INTRAMUSCULAR | Status: AC
  Filled 2024-03-20: qty 2

## 2024-03-20 MED ORDER — FENTANYL CITRATE (PF) 250 MCG/5ML IJ SOLN
INTRAMUSCULAR | Status: AC
  Filled 2024-03-20: qty 5

## 2024-03-20 MED ORDER — LIDOCAINE HCL (PF) 2 % IJ SOLN
INTRAMUSCULAR | Status: DC | PRN
  Administered 2024-03-20: 1.5 mg/kg/h via INTRADERMAL

## 2024-03-20 MED ORDER — CALCIUM POLYCARBOPHIL 625 MG PO TABS: 625.0000 mg | ORAL_TABLET | Freq: Every day | ORAL | Status: DC

## 2024-03-20 MED ORDER — ONDANSETRON 4 MG PO TBDP: 4.0000 mg | ORAL_TABLET | Freq: Four times a day (QID) | ORAL | Status: DC | PRN

## 2024-03-20 MED ORDER — OXYCODONE HCL 5 MG PO TABS: 5.0000 mg | ORAL_TABLET | Freq: Four times a day (QID) | ORAL | Status: DC | PRN

## 2024-03-20 NOTE — H&P (Signed)
 Subjective   Michael [name] is a 74 y.o. male new patient in today for: History of Present Illness Michael [name] is a 74 year old male who presents with a hernia.  He reports a painful hernia that protrudes when moving from sitting to standing and improves after manual reduction. It is in an unusual location and can take a few minutes to reduce.  He has had open heart surgery and appendectomy. The hernia was present before the heart surgery and has never been repaired.  He quit smoking two years ago and his weight is stable. He denies nausea, vomiting, or constipation with hernia episodes. He avoids sudden movements and manually reduces the hernia for relief.  Social Drivers of Health with Concerns   Tobacco Use: Medium Risk (01/15/2024)  Patient History  Smoking Tobacco Use: Former  Smokeless Tobacco Use: Never  Housing Stability: Unknown (01/15/2024)  Housing Stability Vital Sign  Homeless in the Last Year: No    No data to display      Outpatient Medications Prior to Visit  Medication Sig Dispense Refill  aspirin  81 MG EC tablet Take 81 mg by mouth once daily  calcium  polycarbophiL (FIBER, CALCIUM  POLYCARBOPHIL,) 625 mg tablet Take 1 tablet by mouth once daily  cholecalciferol (VITAMIN D3) 1000 unit tablet Take by mouth  losartan  (COZAAR ) 25 MG tablet Take 50 mg by mouth once daily  pantoprazole  (PROTONIX ) 40 MG DR tablet Take 40 mg by mouth once daily  sertraline  (ZOLOFT ) 50 MG tablet Take by mouth  simvastatin  (ZOCOR ) 20 MG tablet Take by mouth  tamsulosin  (FLOMAX ) 0.4 mg capsule  vit B complex 100 no.2/herbs (VITAMIN B COMPLEX 100 2-HERBS ORAL) Take 1 capsule by mouth once daily   No facility-administered medications prior to visit.    Objective   Vitals:  01/15/24 0954 01/15/24 0955  BP: 118/73  Pulse: 57  Temp: 36.2 C (97.1 F)  Weight: (!) 103.4 kg (228 lb)  Height: 180.3 cm (5' 11)  PainSc: 0-No pain   Body mass index is 31.8 kg/m. Physical  Exam Constitutional:  Appearance: Normal appearance.  HENT:  Head: Normocephalic and atraumatic.  Pulmonary:  Effort: Pulmonary effort is normal.  Abdominal:  Comments: No definite palpable hernia  Musculoskeletal:  General: Normal range of motion.  Cervical back: Normal range of motion.  Neurological:  General: No focal deficit present.  Mental Status: He is alert and oriented to person, place, and time. Mental status is at baseline.  Psychiatric:  Mood and Affect: Mood normal.  Behavior: Behavior normal.  Thought Content: Thought content normal.    I reviewed CT scans from 2022 and 2025 showing small area in the Right upper quadrant with hernia near the costal border  Assessment/Plan:   Assessment & Plan Abdominal wall hernia Intermittent hernia with protrusion during activities, requiring manual reduction. The area of protrusion is at the subcostal margin near the semilunar area. Painful upon protrusion. CT scan showed tiny hiatal hernia and small hernia in area of pain. - The patient has a symptomatic reducible hernia. We discussed the etiology of his hernia, the risk of it enlarging, incarceration, obstruction, strangulation, and that it is unlikely to get smaller or better on its own. We discussed operative options of laparoscopic vs open repair with mesh including the risks of recurrence, injury to intestines or abdominal organs, or chronic pain associated with mesh. We decided to proceed with robotic incisional hernia repair with mesh with myofascial release b/l with observation stay.   Diagnoses and  all orders for this visit:  Class 1 obesity due to excess calories with serious comorbidity and body mass index (BMI) of 31.0 to 31.9 in adult  S/P mitral valve repair  Incisional hernia without obstruction or gangrene

## 2024-03-20 NOTE — Discharge Summary (Signed)
 "  Physician Discharge Summary  Michael [name] FMW:[id number] DOB: July 26, 1950 DOA: 03/20/2024  PCP: Shayne Anes, MD  Admit date: 03/20/2024 Discharge date:  03/20/2024   Recommendations for Outpatient Follow-up:   (include homehealth, outpatient follow-up instructions, specific recommendations for PCP to follow-up on, etc.)   Follow-up Information     Daemien Fronczak, Herlene Righter, MD Follow up on 04/08/2024.   Specialty: General Surgery Contact information: [address] [place] [address] [phone]                Discharge Diagnoses:  Principal Problem:   Incisional hernia   Surgical Procedure: robotic incisional hernia repair with mesh (TAPP)  Discharge Condition: Good Disposition: Home  Diet recommendation: reg diet   Hospital Course:  74 yo male with epigastric hernia underwent surgery. He was observed on the surgical floor. He did well and had minimal pain. He was discharged home POD 0.  Discharge Instructions  Discharge Instructions     Diet - low sodium heart healthy   Complete by: As directed    Discharge wound care:   Complete by: As directed    Shower normal tomorrow. Glue to stay on for 10-14 days. No bandage needed.   Increase activity slowly   Complete by: As directed       Allergies as of 03/20/2024   No Known Allergies      Medication List     STOP taking these medications    amoxicillin  500 MG capsule Commonly known as: AMOXIL        TAKE these medications    amLODipine  5 MG tablet Commonly known as: NORVASC  Take 1 tablet (5 mg total) by mouth daily.   aspirin  81 MG tablet Take 81 mg by mouth daily.   B Complex Caps Take 1 capsule by mouth daily.   FIBER COMPLETE PO Take 1,000 mg by mouth daily.   NON FORMULARY Pt uses a cpap nightly   oxyCODONE  5 MG immediate release tablet Commonly known as: Oxy IR/ROXICODONE  Take 1 tablet (5 mg total) by mouth every 6 (six) hours as needed for severe pain (pain  score 7-10).   pantoprazole  40 MG tablet Commonly known as: PROTONIX  Take 40 mg by mouth daily.   sertraline  50 MG tablet Commonly known as: ZOLOFT  Take 50 mg by mouth daily.   simvastatin  20 MG tablet Commonly known as: ZOCOR  Take 20 mg by mouth at bedtime.   tamsulosin  0.4 MG Caps capsule Commonly known as: FLOMAX  Take 0.4 mg by mouth every other day.   Vitamin D  50 MCG (2000 UT) tablet Take 2,000 Units by mouth daily.               Discharge Care Instructions  (From admission, onward)           Start     Ordered   03/20/24 0000  Discharge wound care:       Comments: Shower normal tomorrow. Glue to stay on for 10-14 days. No bandage needed.   03/20/24 1357            Follow-up Information     Guadalupe Nickless, Herlene Righter, MD Follow up on 04/08/2024.   Specialty: General Surgery Contact information: [address] [place] [address] [phone]                  The results of significant diagnostics from this hospitalization (including imaging, microbiology, ancillary and laboratory) are listed below for reference.  Significant Diagnostic Studies: No results found.  Labs: Basic Metabolic Panel: Recent Labs  Lab 03/20/24 1248  CREATININE 1.33*   Liver Function Tests: No results for input(s): AST, ALT, ALKPHOS, BILITOT, PROT, ALBUMIN  in the last 168 hours.  CBC: Recent Labs  Lab 03/20/24 1248  WBC 13.3*  HGB 15.7  HCT 47.1  MCV 93.3  PLT 241    CBG: No results for input(s): GLUCAP in the last 168 hours.  Principal Problem:   Incisional hernia   Time coordinating discharge: 15 min  "

## 2024-03-20 NOTE — Anesthesia Postprocedure Evaluation (Signed)
"   Anesthesia Post Note  Patient: Arben Packman Dicenso  Procedure(s) Performed: REPAIR, HERNIA, VENTRAL, ROBOT-ASSISTED     Patient location during evaluation: PACU Anesthesia Type: General Level of consciousness: awake and alert Pain management: pain level controlled Vital Signs Assessment: post-procedure vital signs reviewed and stable Respiratory status: spontaneous breathing, nonlabored ventilation and respiratory function stable Cardiovascular status: blood pressure returned to baseline and stable Postop Assessment: no apparent nausea or vomiting Anesthetic complications: no   No notable events documented.  Last Vitals:  Vitals:   03/20/24 1100 03/20/24 1115  BP: (!) 107/57 (!) 113/58  Pulse: (!) 55 (!) 55  Resp: 16 16  Temp:    SpO2: 95% 96%    Last Pain:  Vitals:   03/20/24 1115  TempSrc:   PainSc: Asleep                 Butler Levander Pinal      "

## 2024-03-20 NOTE — Progress Notes (Signed)
 Discharge medications delivered to patient at the bedside in a secure bag.

## 2024-03-20 NOTE — Progress Notes (Signed)
 Discharge instructions discussed with patient and family, verbalized agreement and understanding

## 2024-03-20 NOTE — Op Note (Addendum)
 Preoperative diagnosis: epigastric hernia  Postoperative diagnosis: same   Procedure: robotic 5 cm epigastric repair with preperitoneal mesh insertion  Surgeon: Herlene Bureau, M.D.  Asst: none  Anesthesia: general  Indications for procedure: Michael [name] is a 74 y.o. year old male with symptoms of abdominal pain. he was found to have a ventral hernia. After discussed options, decision was made to proceed with robotic minimally invasive repair with mesh.  Description of procedure: The patient was brought into the operative suite. Anesthesia was administered with General endotracheal anesthesia. WHO checklist was applied. The patient was then placed in supine position with tucked arms. The area was prepped and draped in the usual sterile fashion.  Next, a left preiumbilical incision was made. A 5mm trocar was used to gain access to the peritoneal cavity by optical entry technique. Pneumoperitoneum was applied with a high flow and low pressure. The laparoscope was reinserted to confirm position. Bilateral TAP blocks were placed with Marcaine . 2 additional 8 mm trocars were placed in the left mid and left latera abdomen. 1 8 mm trocar was placed in the right mid abdomen. The periumbilical trocar was upsized to a 12 mm trocar.  The peritoneum was incised and a plane created separating the peritoneum away from the posterior rectus sheath. This was done starting at the base of the falciform and working up to the diaphragm and beyond the diaphragm. The hernia was to the right of the xiphoid and contained peritoneal fat which was reduced. The fascial defect was 5 cm in diameter and closed with a running 0 symmetric Strattafix suture. A 3-0 strattafix was used to close a peritoneal hole toward the top of the diaphragm. A 15 x 15 cm piece of Bard Soft mesh was placed into the preperitoneal space. The peritoneal flap was re-approximated with 3-0 Strattafix.  The 12 mm trocar fascia was closed with 0  vicryl by transfascial suture passer. Pneumoperitoneum was evacuated. Trocars were removed. All incisions were closed with 4-0 monocryl subcuticular sutures. Dermabond was put in place for dressing.  Findings: 5 cm hernia near xiphoid  Specimen: none  Implant: Bard Soft mesh   Blood loss: 20 ml  Local anesthesia: 30 ml of Marcaine   Complications: none  Herlene Bureau, M.D. General, Bariatric, & Minimally Invasive Surgery [facility]

## 2024-03-20 NOTE — Transfer of Care (Signed)
 Immediate Anesthesia Transfer of Care Note  Patient: Michael [name]  Procedure(s) Performed: REPAIR, HERNIA, VENTRAL, ROBOT-ASSISTED  Patient Location: PACU  Anesthesia Type:General  Level of Consciousness: awake  Airway & Oxygen Therapy: Patient Spontanous Breathing and Patient connected to face mask oxygen  Post-op Assessment: Report given to RN and Post -op Vital signs reviewed and stable  Post vital signs: Reviewed and stable  Last Vitals:  Vitals Value Taken Time  BP 115/51 03/20/24 09:33  Temp    Pulse 66 03/20/24 09:35  Resp 15 03/20/24 09:35  SpO2 96 % 03/20/24 09:35  Vitals shown include unfiled device data.  Last Pain:  Vitals:   03/20/24 0534  TempSrc: Oral  PainSc: 0-No pain         Complications: No notable events documented.

## 2024-03-20 NOTE — Anesthesia Procedure Notes (Signed)
 Procedure Name: Intubation Date/Time: 03/20/2024 7:34 AM  Performed by: Belvie Valri NOVAK, CRNAPre-anesthesia Checklist: Patient identified, Emergency Drugs available, Suction available and Patient being monitored Patient Re-evaluated:Patient Re-evaluated prior to induction Oxygen Delivery Method: Circle System Utilized Preoxygenation: Pre-oxygenation with 100% oxygen Induction Type: IV induction Ventilation: Oral airway inserted - appropriate to patient size and Mask ventilation with difficulty Laryngoscope Size: Mac and 4 Grade View: Grade I Tube type: Oral Tube size: 7.5 mm Number of attempts: 1 Airway Equipment and Method: Stylet and Oral airway Placement Confirmation: ETT inserted through vocal cords under direct vision, positive ETCO2 and breath sounds checked- equal and bilateral Secured at: 22 cm Tube secured with: Tape Dental Injury: Teeth and Oropharynx as per pre-operative assessment

## 2024-03-20 NOTE — Discharge Instructions (Signed)

## 2024-03-26 ENCOUNTER — Ambulatory Visit (HOSPITAL_COMMUNITY)

## 2024-12-01 ENCOUNTER — Telehealth: Admitting: Family Medicine
